# Patient Record
Sex: Male | Born: 1995 | Race: Black or African American | Hispanic: No | Marital: Married | Smoking: Current some day smoker
Health system: Southern US, Community
[De-identification: ages and names within clinical notes are randomized; demographics above are authoritative.]

## PROBLEM LIST (undated history)

## (undated) ENCOUNTER — Emergency Department (HOSPITAL_COMMUNITY): Admission: EM | Payer: Self-pay | Source: Home / Self Care

## (undated) DIAGNOSIS — F1910 Other psychoactive substance abuse, uncomplicated: Secondary | ICD-10-CM

## (undated) DIAGNOSIS — I10 Essential (primary) hypertension: Secondary | ICD-10-CM

## (undated) DIAGNOSIS — I509 Heart failure, unspecified: Secondary | ICD-10-CM

---
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
## 2014-02-24 ENCOUNTER — Ambulatory Visit: Payer: Self-pay | Admitting: Pediatrics

## 2019-04-08 ENCOUNTER — Emergency Department (HOSPITAL_COMMUNITY)
Admission: EM | Admit: 2019-04-08 | Discharge: 2019-04-08 | Disposition: A | Discharge status: Home / Self Care | Payer: Medicaid Other | Attending: Emergency Medicine | Admitting: Emergency Medicine

## 2019-04-08 ENCOUNTER — Emergency Department (HOSPITAL_COMMUNITY): Payer: Medicaid Other

## 2019-04-08 DIAGNOSIS — M79671 Pain in right foot: Secondary | ICD-10-CM | POA: Insufficient documentation

## 2019-04-08 DIAGNOSIS — F17210 Nicotine dependence, cigarettes, uncomplicated: Secondary | ICD-10-CM | POA: Insufficient documentation

## 2019-04-08 DIAGNOSIS — Z791 Long term (current) use of non-steroidal anti-inflammatories (NSAID): Secondary | ICD-10-CM | POA: Insufficient documentation

## 2019-04-08 DIAGNOSIS — M25561 Pain in right knee: Secondary | ICD-10-CM

## 2019-04-08 MED ORDER — HYDROCODONE-ACETAMINOPHEN 5-325 MG PO TABS
1.0000 | ORAL_TABLET | Freq: Once | ORAL | Status: AC
  Administered 2019-04-08: 1 via ORAL
  Filled 2019-04-08: qty 1

## 2019-04-08 NOTE — Discharge Instructions (Signed)

## 2019-04-08 NOTE — ED Triage Notes (Signed)
C/o right knee pain and right foot pain  Reports getting into "something" a "tussle" inside a home last night, not clear on whether he twisted or hit an object.  Tried ice and did not work for pain.

## 2019-04-08 NOTE — ED Notes (Signed)
Patient verbalizes understanding of discharge instructions. Opportunity for questioning and answers were provided. Armband removed by staff, pt discharged from ED via wheelchair to home.  

## 2019-04-08 NOTE — ED Provider Notes (Signed)
[facility] EMERGENCY DEPARTMENT Provider Note   CSN: [id number] Arrival date & time: 04/08/19  1008     History   Chief Complaint Chief Complaint  Patient presents with  . Foot Pain  . Knee Pain    HPI JENNINGS CORADO is a 23 y.o. male.     HPI   Patient is a 23 year old male who presents emergency department today complaining of right knee and foot pain.  Patient states he was leaving a room yesterday and thinks that he twisted his knee and foot.  Since then he has had significant pain.  He has been able to bear weight today but it exacerbates his pain.  He denies any pain in the ankle.  He denies any head trauma or LOC.  No past medical history on file.  There are no active problems to display for this patient.   No past surgical history on file.      Home Medications    Prior to Admission medications   Medication Sig Start Date End Date Taking? Authorizing Provider  meloxicam (MOBIC) 15 MG tablet Take 1 tablet (15 mg total) by mouth daily. 03/03/13   Harden Mo, MD  naproxen (NAPROSYN) 375 MG tablet Take 1 tablet (375 mg total) by mouth 3 (three) times daily with meals. 12/28/13   Junius Creamer, NP  oxyCODONE-acetaminophen (PERCOCET) 5-325 MG per tablet 1 to 2 tablets every 6 hours as needed for pain. 03/03/13   Harden Mo, MD    Family History No family history on file.  Social History Social History   Tobacco Use  . Smoking status: Current Every Day Smoker  Substance Use Topics  . Alcohol use: No  . Drug use: No     Allergies   Patient has no known allergies.   Review of Systems Review of Systems  Musculoskeletal:       Right knee pain, right foot pain  Neurological:       No head trauma or loc     Physical Exam Updated Vital Signs BP 136/74 (BP Location: Right Arm)   Pulse 77   Temp 98.3 F (36.8 C) (Oral)   Resp 20   SpO2 98%   Physical Exam Constitutional:      General: He is not in acute distress.  Appearance: He is well-developed.  Eyes:     Conjunctiva/sclera: Conjunctivae normal.  Cardiovascular:     Rate and Rhythm: Normal rate.  Pulmonary:     Effort: Pulmonary effort is normal.  Musculoskeletal:     Comments: TTP over the medial and lateral joint lines of the right knee.  No obvious deformity or significant swelling.  No obvious joint laxity with varus/valgus stress however patient does not allow for full assessment of the knee 2/2 pain.  TTP to the lateral aspect of the right foot with associated swelling.  No TTP to the medial or lateral malleolus.  Neurovascular intact distally.  Skin:    General: Skin is warm and dry.  Neurological:     Mental Status: He is alert and oriented to person, place, and time.      ED Treatments / Results  Labs (all labs ordered are listed, but only abnormal results are displayed) Labs Reviewed - No data to display  EKG None  Radiology Dg Knee Complete 4 Views Right  Result Date: 04/08/2019 CLINICAL DATA:  23 year old male with history of right knee pain. EXAM: RIGHT KNEE - COMPLETE 4+ VIEW COMPARISON:  No priors. FINDINGS: No evidence of fracture, dislocation, or joint effusion. No evidence of arthropathy or other focal bone abnormality. Soft tissues are unremarkable. IMPRESSION: Negative. Electronically Signed   By: Trudie Reed M.D.   On: 04/08/2019 10:41   Dg Foot Complete Right  Result Date: 04/08/2019 CLINICAL DATA:  Foot pain following twisting injury, initial encounter EXAM: RIGHT FOOT COMPLETE - 3+ VIEW COMPARISON:  None. FINDINGS: There is no evidence of fracture or dislocation. There is no evidence of arthropathy or other focal bone abnormality. Soft tissues are unremarkable. IMPRESSION: No acute abnormality noted. Electronically Signed   By: Alcide Clever M.D.   On: 04/08/2019 10:44    Procedures Procedures (including critical care time)  Medications Ordered in ED Medications  HYDROcodone-acetaminophen (NORCO/VICODIN)  5-325 MG per tablet 1 tablet (1 tablet Oral Given 04/08/19 1240)     Initial Impression / Assessment and Plan / ED Course  I have reviewed the triage vital signs and the nursing notes.  Pertinent labs & imaging results that were available during my care of the patient were reviewed by me and considered in my medical decision making (see chart for details).      Final Clinical Impressions(s) / ED Diagnoses   Final diagnoses:  Acute pain of right knee  Right foot pain   23 year old male presenting for evaluation of right knee and foot pain after injury last night.  Examination of the knee somewhat limited secondary to patient's inability to cooperate with exam though there is no appreciable joint laxity or deformity.  Also does have some foot tenderness.  No tenderness throughout the ankle.  X-ray right knee and right foot negative for acute fracture or abnormality.  Patient given knee immobilizer and crutches.  Advised not to bear weight until he can follow-up with orthopedics.  Advised Tylenol/Motrin, elevation and cold compresses.  Advised on return precautions.  He voices understanding of plan and reasons to return.  Questions answered.  Patient sent for discharge.  ED Discharge Orders    None       Karrie Meres, [place] 04/08/19 1241    Tegeler, Canary Brim, MD 04/08/19 [phone]

## 2020-09-14 ENCOUNTER — Other Ambulatory Visit: Payer: Self-pay

## 2020-09-14 ENCOUNTER — Ambulatory Visit (HOSPITAL_COMMUNITY)
Admission: EM | Admit: 2020-09-14 | Discharge: 2020-09-14 | Disposition: A | Discharge status: Home / Self Care | Payer: Medicaid Other | Attending: Physician Assistant | Admitting: Physician Assistant

## 2020-09-14 ENCOUNTER — Encounter (HOSPITAL_COMMUNITY): Payer: Self-pay | Admitting: Emergency Medicine

## 2020-09-14 DIAGNOSIS — J020 Streptococcal pharyngitis: Secondary | ICD-10-CM

## 2020-09-14 LAB — POCT RAPID STREP A, ED / UC: Streptococcus, Group A Screen (Direct): NEGATIVE

## 2020-09-14 MED ORDER — AMOXICILLIN 500 MG PO CAPS: 500.0000 mg | ORAL_CAPSULE | Freq: Two times a day (BID) | ORAL | 0 refills | Status: AC

## 2020-09-14 MED ORDER — ONDANSETRON HCL 4 MG PO TABS: 4.0000 mg | ORAL_TABLET | Freq: Three times a day (TID) | ORAL | 0 refills | Status: AC | PRN

## 2020-09-14 MED ORDER — ONDANSETRON 4 MG PO TBDP
4.0000 mg | ORAL_TABLET | Freq: Once | ORAL | Status: AC
  Administered 2020-09-14: 4 mg via ORAL

## 2020-09-14 MED ORDER — ONDANSETRON 4 MG PO TBDP
ORAL_TABLET | ORAL | Status: AC
  Filled 2020-09-14: qty 1

## 2020-09-14 NOTE — ED Provider Notes (Signed)
[facility]    CSN: [id number] Arrival date & time: 09/14/20  1841      History   Chief Complaint Chief Complaint  Patient presents with  . Sore Throat    HPI Carlos Mckinney is a 25 y.o. male.   Pt complains of sore throat that started yesterday.  Denies fever, chills, congestion, cough, trouble swallowing.  Pain is worse with swallowing.  He endorses fatigue and associated nausea.  Denies vomiting or GI symptoms. He has tried nothing for his symptoms.  Denies sick contacts.      History reviewed. No pertinent past medical history.  There are no problems to display for this patient.   History reviewed. No pertinent surgical history.     Home Medications    Prior to Admission medications   Medication Sig Start Date End Date Taking? Authorizing Provider  amoxicillin (AMOXIL) 500 MG capsule Take 1 capsule (500 mg total) by mouth 2 (two) times daily for 10 days. 09/14/20 09/24/20 Yes Ashlea Dusing, PA-C  ondansetron (ZOFRAN) 4 MG tablet Take 1 tablet (4 mg total) by mouth every 8 (eight) hours as needed for up to 5 days for nausea or vomiting. 09/14/20 09/19/20 Yes Almas Rake, Shanda Bumps, PA-C  meloxicam (MOBIC) 15 MG tablet Take 1 tablet (15 mg total) by mouth daily. 03/03/13   Reuben Likes, MD  naproxen (NAPROSYN) 375 MG tablet Take 1 tablet (375 mg total) by mouth 3 (three) times daily with meals. 12/28/13   Earley Favor, NP  oxyCODONE-acetaminophen (PERCOCET) 5-325 MG per tablet 1 to 2 tablets every 6 hours as needed for pain. 03/03/13   Reuben Likes, MD    Family History History reviewed. No pertinent family history.  Social History Social History   Tobacco Use  . Smoking status: Current Every Day Smoker  Substance Use Topics  . Alcohol use: No  . Drug use: No     Allergies   Patient has no known allergies.   Review of Systems Review of Systems  Constitutional: Positive for fatigue. Negative for chills and fever.  HENT: Positive for sore  throat. Negative for congestion and ear pain.   Eyes: Negative for pain and visual disturbance.  Respiratory: Negative for cough and shortness of breath.   Cardiovascular: Negative for chest pain and palpitations.  Gastrointestinal: Negative for abdominal pain and vomiting.  Genitourinary: Negative for dysuria and hematuria.  Musculoskeletal: Negative for arthralgias and back pain.  Skin: Negative for color change and rash.  Neurological: Negative for seizures and syncope.  All other systems reviewed and are negative.    Physical Exam Triage Vital Signs ED Triage Vitals  Enc Vitals Group     BP 09/14/20 1913 137/84     Pulse Rate 09/14/20 1913 94     Resp 09/14/20 1913 18     Temp 09/14/20 1913 99.2 F (37.3 C)     Temp Source 09/14/20 1913 Oral     SpO2 09/14/20 1913 100 %     Weight --      Height --      Head Circumference --      Peak Flow --      Pain Score 09/14/20 1912 10     Pain Loc --      Pain Edu? --      Excl. in GC? --    No data found.  Updated Vital Signs BP 137/84 (BP Location: Right Arm)   Pulse 94   Temp 99.2 F (37.3  C) (Oral)   Resp 18   SpO2 100%   Visual Acuity Right Eye Distance:   Left Eye Distance:   Bilateral Distance:    Right Eye Near:   Left Eye Near:    Bilateral Near:     Physical Exam Vitals and nursing note reviewed.  Constitutional:      Appearance: He is well-developed and well-nourished.  HENT:     Head: Normocephalic and atraumatic.     Mouth/Throat:     Pharynx: Pharyngeal swelling present.     Tonsils: Tonsillar exudate present. No tonsillar abscesses. 1+ on the right. 1+ on the left.  Eyes:     Conjunctiva/sclera: Conjunctivae normal.  Cardiovascular:     Rate and Rhythm: Normal rate and regular rhythm.     Heart sounds: No murmur heard.   Pulmonary:     Effort: Pulmonary effort is normal. No respiratory distress.     Breath sounds: Normal breath sounds.  Abdominal:     Palpations: Abdomen is soft.      Tenderness: There is no abdominal tenderness.  Musculoskeletal:        General: No edema.     Cervical back: Neck supple.  Skin:    General: Skin is warm and dry.  Neurological:     Mental Status: He is alert.  Psychiatric:        Mood and Affect: Mood and affect normal.      UC Treatments / Results  Labs (all labs ordered are listed, but only abnormal results are displayed) Labs Reviewed  CULTURE, GROUP A STREP [facility])  POCT RAPID STREP A, ED / UC    EKG   Radiology No results found.  Procedures Procedures (including critical care time)  Medications Ordered in UC Medications  ondansetron (ZOFRAN-ODT) disintegrating tablet 4 mg (has no administration in time range)    Initial Impression / Assessment and Plan / UC Course  I have reviewed the triage vital signs and the nursing notes.  Pertinent labs & imaging results that were available during my care of the patient were reviewed by me and considered in my medical decision making (see chart for details).     Will treat for presumptive strep.  Pt requests something for nausea.  Return precautions discussed.    Final Clinical Impressions(s) / UC Diagnoses   Final diagnoses:  Strep throat     Discharge Instructions     Take medication as prescribed  Can take Tylenol or Ibuprofen as needed for pain.     ED Prescriptions    Medication Sig Dispense Auth. Provider   amoxicillin (AMOXIL) 500 MG capsule Take 1 capsule (500 mg total) by mouth 2 (two) times daily for 10 days. 21 capsule Lonisha Bobby, PA-C   ondansetron (ZOFRAN) 4 MG tablet Take 1 tablet (4 mg total) by mouth every 8 (eight) hours as needed for up to 5 days for nausea or vomiting. 15 tablet Jodell Cipro, PA-C     PDMP not reviewed this encounter.   Jodell Cipro, PA-C 09/14/20 1945

## 2020-09-14 NOTE — Discharge Instructions (Signed)
Take medication as prescribed  Can take Tylenol or Ibuprofen as needed for pain.

## 2020-09-14 NOTE — ED Triage Notes (Signed)
Pt presents with sore throat that started yesterday.

## 2020-09-16 LAB — CULTURE, GROUP A STREP (THRC)

## 2020-09-17 LAB — CULTURE, GROUP A STREP (THRC)

## 2020-12-17 ENCOUNTER — Encounter (HOSPITAL_COMMUNITY): Payer: Self-pay | Admitting: Emergency Medicine

## 2020-12-17 ENCOUNTER — Ambulatory Visit (HOSPITAL_COMMUNITY)
Admission: EM | Admit: 2020-12-17 | Discharge: 2020-12-17 | Disposition: A | Discharge status: Home / Self Care | Payer: Self-pay | Attending: Student | Admitting: Student

## 2020-12-17 ENCOUNTER — Other Ambulatory Visit: Payer: Self-pay

## 2020-12-17 ENCOUNTER — Ambulatory Visit (INDEPENDENT_AMBULATORY_CARE_PROVIDER_SITE_OTHER): Payer: Self-pay

## 2020-12-17 DIAGNOSIS — J189 Pneumonia, unspecified organism: Secondary | ICD-10-CM

## 2020-12-17 DIAGNOSIS — Z20822 Contact with and (suspected) exposure to covid-19: Secondary | ICD-10-CM

## 2020-12-17 DIAGNOSIS — R0602 Shortness of breath: Secondary | ICD-10-CM

## 2020-12-17 DIAGNOSIS — R059 Cough, unspecified: Secondary | ICD-10-CM

## 2020-12-17 DIAGNOSIS — F17200 Nicotine dependence, unspecified, uncomplicated: Secondary | ICD-10-CM

## 2020-12-17 MED ORDER — PREDNISONE 10 MG (21) PO TBPK: ORAL_TABLET | Freq: Every day | ORAL | 0 refills | Status: DC

## 2020-12-17 MED ORDER — ALBUTEROL SULFATE HFA 108 (90 BASE) MCG/ACT IN AERS
2.0000 | INHALATION_SPRAY | Freq: Once | RESPIRATORY_TRACT | Status: AC
  Administered 2020-12-17: 2 via RESPIRATORY_TRACT

## 2020-12-17 MED ORDER — ALBUTEROL SULFATE HFA 108 (90 BASE) MCG/ACT IN AERS
INHALATION_SPRAY | RESPIRATORY_TRACT | Status: AC
  Filled 2020-12-17: qty 6.7

## 2020-12-17 MED ORDER — AMOXICILLIN-POT CLAVULANATE 875-125 MG PO TABS: 1.0000 | ORAL_TABLET | Freq: Two times a day (BID) | ORAL | 0 refills | Status: DC

## 2020-12-17 MED ORDER — DEXAMETHASONE 1 MG/ML PO CONC: 4.0000 mg | Freq: Once | ORAL | Status: DC

## 2020-12-17 MED ORDER — DEXAMETHASONE 10 MG/ML FOR PEDIATRIC ORAL USE
INTRAMUSCULAR | Status: AC
  Filled 2020-12-17: qty 1

## 2020-12-17 NOTE — ED Triage Notes (Addendum)
Pt requesting to use [facility] again. Pt is coughing . Pt observed to be lying flat and reports having a hard time breathing. This was reported to Provider. Pt refuses to sit up because he is tired.

## 2020-12-17 NOTE — Discharge Instructions (Signed)
Left without being discharged

## 2020-12-17 NOTE — ED Triage Notes (Signed)
Pt presents with cough, sob, scratchy throat xs 3 days.

## 2020-12-17 NOTE — ED Provider Notes (Addendum)
[facility]    CSN: [id number] Arrival date & time: 12/17/20  1449      History   Chief Complaint Chief Complaint  Patient presents with  . Shortness of Breath  . Cough    HPI Carlos Mckinney is a 25 y.o. male presenting with cough, sore throat, shortness of breath x3 days. Medical history strep throat, current every day smoker.   Denies formal history of asthma but states that he does have intermittent episodes of shortness of breath. Today presenting with shortness of breath and productive cough with yellow sputum. His children have asthma, and he tried their albuterol inhaler.  Minimal relief today with albuterol inhaler.  Shortness of breath with coughing episodes.  Denies fevers/chills, chest pain, dizziness, weakness in arms or legs, heart palpitations, calf swelling  HPI  History reviewed. No pertinent past medical history.  There are no problems to display for this patient.   History reviewed. No pertinent surgical history.     Home Medications    Prior to Admission medications   Medication Sig Start Date End Date Taking? Authorizing Provider  amoxicillin-clavulanate (AUGMENTIN) 875-125 MG tablet Take 1 tablet by mouth every 12 (twelve) hours. 12/17/20  Yes Rhys Martini, PA-C  predniSONE (STERAPRED UNI-PAK 21 TAB) 10 MG (21) TBPK tablet Take by mouth daily. Take 6 tabs by mouth daily  for 2 days, then 5 tabs for 2 days, then 4 tabs for 2 days, then 3 tabs for 2 days, 2 tabs for 2 days, then 1 tab by mouth daily for 2 days 12/17/20  Yes Cheree Ditto, Lyman Speller, PA-C  meloxicam (MOBIC) 15 MG tablet Take 1 tablet (15 mg total) by mouth daily. 03/03/13   Reuben Likes, MD  naproxen (NAPROSYN) 375 MG tablet Take 1 tablet (375 mg total) by mouth 3 (three) times daily with meals. 12/28/13   Earley Favor, NP  oxyCODONE-acetaminophen (PERCOCET) 5-325 MG per tablet 1 to 2 tablets every 6 hours as needed for pain. 03/03/13   Reuben Likes, MD    Family History History  reviewed. No pertinent family history.  Social History Social History   Tobacco Use  . Smoking status: Current Every Day Smoker  Substance Use Topics  . Alcohol use: No  . Drug use: No     Allergies   Patient has no known allergies.   Review of Systems Review of Systems  Constitutional: Negative for appetite change, chills and fever.  HENT: Positive for congestion and sore throat. Negative for ear pain, rhinorrhea, sinus pressure, sinus pain, trouble swallowing and voice change.   Eyes: Negative for redness and visual disturbance.  Respiratory: Positive for cough and shortness of breath. Negative for chest tightness and wheezing.   Cardiovascular: Negative for chest pain and palpitations.  Gastrointestinal: Negative for abdominal pain, constipation, diarrhea, nausea and vomiting.  Genitourinary: Negative for dysuria, frequency and urgency.  Musculoskeletal: Negative for myalgias.  Neurological: Negative for dizziness, weakness and headaches.  Psychiatric/Behavioral: Negative for confusion.  All other systems reviewed and are negative.    Physical Exam Triage Vital Signs ED Triage Vitals  Enc Vitals Group     BP 12/17/20 1458 106/82     Pulse Rate 12/17/20 1458 (!) 116     Resp 12/17/20 1458 (!) 22     Temp 12/17/20 1458 99 F (37.2 C)     Temp Source 12/17/20 1458 Oral     SpO2 12/17/20 1458 98 %     Weight --  Height --      Head Circumference --      Peak Flow --      Pain Score 12/17/20 1459 0     Pain Loc --      Pain Edu? --      Excl. in GC? --    No data found.  Updated Vital Signs BP 106/82 (BP Location: Right Arm)   Pulse (!) 116   Temp 99 F (37.2 C) (Oral)   Resp (!) 22   SpO2 98%   Visual Acuity Right Eye Distance:   Left Eye Distance:   Bilateral Distance:    Right Eye Near:   Left Eye Near:    Bilateral Near:     Physical Exam Vitals reviewed.  Constitutional:      General: He is not in acute distress.    Appearance: Normal  appearance. He is not ill-appearing.  HENT:     Head: Normocephalic and atraumatic.     Right Ear: Hearing, tympanic membrane, ear canal and external ear normal. No swelling or tenderness. There is no impacted cerumen. No mastoid tenderness. Tympanic membrane is not perforated, erythematous, retracted or bulging.     Left Ear: Hearing, tympanic membrane, ear canal and external ear normal. No swelling or tenderness. There is no impacted cerumen. No mastoid tenderness. Tympanic membrane is not perforated, erythematous, retracted or bulging.     Nose:     Right Sinus: No maxillary sinus tenderness or frontal sinus tenderness.     Left Sinus: No maxillary sinus tenderness or frontal sinus tenderness.     Mouth/Throat:     Mouth: Mucous membranes are moist.     Pharynx: Uvula midline. No oropharyngeal exudate or posterior oropharyngeal erythema.     Tonsils: No tonsillar exudate.  Cardiovascular:     Rate and Rhythm: Normal rate and regular rhythm.     Heart sounds: Normal heart sounds.  Pulmonary:     Effort: Tachypnea present. No accessory muscle usage, prolonged expiration, respiratory distress or retractions.     Breath sounds: Normal air entry. No stridor. Decreased breath sounds present. No wheezing, rhonchi or rales.     Comments: Decreased lung sounds throughout  Frequent hacking cough  No wheezes rhonchi or rales   Chest:     Chest wall: No tenderness.  Abdominal:     General: Abdomen is flat. Bowel sounds are normal.     Tenderness: There is no abdominal tenderness. There is no guarding or rebound.  Lymphadenopathy:     Cervical: No cervical adenopathy.  Skin:    Capillary Refill: Capillary refill takes less than 2 seconds.  Neurological:     General: No focal deficit present.     Mental Status: He is alert and oriented to person, place, and time.  Psychiatric:        Attention and Perception: Attention and perception normal.        Mood and Affect: Mood and affect normal.         Behavior: Behavior normal. Behavior is cooperative.        Thought Content: Thought content normal.        Judgment: Judgment normal.      UC Treatments / Results  Labs (all labs ordered are listed, but only abnormal results are displayed) Labs Reviewed - No data to display  EKG   Radiology DG Chest 2 View  Result Date: 12/17/2020 CLINICAL DATA:  Cough and shortness of breath. EXAM: CHEST - 2 VIEW  COMPARISON:  May 24, 2004 FINDINGS: Mild to moderate severity bilateral, predominantly perihilar and bibasilar infiltrates are seen. There is no evidence of a pleural effusion or pneumothorax. The cardiac silhouette is borderline in size. The visualized skeletal structures are unremarkable. IMPRESSION: Mild to moderate severity bilateral infiltrates, as described above. Electronically Signed   By: Aram Candela M.D.   On: 12/17/2020 16:20    Procedures Procedures (including critical care time)  Medications Ordered in UC Medications  albuterol (VENTOLIN HFA) 108 (90 Base) MCG/ACT inhaler 2 puff (2 puffs Inhalation Given 12/17/20 1537)    Initial Impression / Assessment and Plan / UC Course  I have reviewed the triage vital signs and the nursing notes.  Pertinent labs & imaging results that were available during my care of the patient were reviewed by me and considered in my medical decision making (see chart for details).     This patient is a 25 year old male presenting with pneumonia.  This patient left without being discharged before test results could be delivered in person. He is afebrile, oxygenating well on room air.  Initially, pulse at 116 and respirations at 22.  Albuterol inhaler provided. Following administration of albuterol, minimal improvement in this.   Pt is a current smoker. Given shortness of breath and tachycardia/tachypnea, CXR performed, which showed Mild to moderate severity bilateral infiltrates. Suspect covid-19. Unfortunately, this patient left  before discharge.  Were not able to administer a COVID test or Solu-Medrol.  I did call him to deliver these test results, but he did not answer the phone.  I sent Augmentin and prednisone taper as below, with strict ED return precautions. He is not a diabetic.  Attempted to call this patient twice with no response.   Coding this visit a Level 4 for acute illness with systemic symptoms (pneumonia), prescription drug management (augmentin and prednisone).   Final Clinical Impressions(s) / UC Diagnoses   Final diagnoses:  Pneumonia of both lungs due to infectious organism, unspecified part of lung  Current smoker  Suspected COVID-19 virus infection     Discharge Instructions     Left without being discharged    ED Prescriptions    Medication Sig Dispense Auth. Provider   amoxicillin-clavulanate (AUGMENTIN) 875-125 MG tablet Take 1 tablet by mouth every 12 (twelve) hours. 14 tablet Rhys Martini, PA-C   predniSONE (STERAPRED UNI-PAK 21 TAB) 10 MG (21) TBPK tablet Take by mouth daily. Take 6 tabs by mouth daily  for 2 days, then 5 tabs for 2 days, then 4 tabs for 2 days, then 3 tabs for 2 days, 2 tabs for 2 days, then 1 tab by mouth daily for 2 days 42 tablet Rhys Martini, PA-C     PDMP not reviewed this encounter.   Rhys Martini, PA-C 12/17/20 1643    Rhys Martini, PA-C 12/18/20 1020

## 2020-12-17 NOTE — ED Notes (Signed)
Pt at staff desk reporting he was not going to wait any longer. Pt was encouraged to wait until results of Chest x-ray came back . Pt refused . This Clinical research associate reported to PA Pt left .

## 2020-12-20 ENCOUNTER — Other Ambulatory Visit: Payer: Medicaid Other

## 2021-04-06 ENCOUNTER — Telehealth (HOSPITAL_COMMUNITY): Payer: Self-pay | Admitting: Internal Medicine

## 2021-04-06 NOTE — Telephone Encounter (Signed)
[facility] called to f/u w/referral, please advise

## 2021-05-02 ENCOUNTER — Ambulatory Visit (HOSPITAL_COMMUNITY)
Admission: RE | Admit: 2021-05-02 | Discharge: 2021-05-02 | Disposition: A | Discharge status: Home / Self Care | Payer: Self-pay | Source: Ambulatory Visit | Attending: Internal Medicine | Admitting: Internal Medicine

## 2021-05-02 ENCOUNTER — Other Ambulatory Visit: Payer: Self-pay

## 2021-05-02 ENCOUNTER — Other Ambulatory Visit (HOSPITAL_COMMUNITY): Payer: Self-pay

## 2021-05-02 ENCOUNTER — Encounter (HOSPITAL_COMMUNITY): Payer: Self-pay | Admitting: Internal Medicine

## 2021-05-02 ENCOUNTER — Telehealth (HOSPITAL_COMMUNITY): Payer: Self-pay | Admitting: Pharmacy Technician

## 2021-05-02 VITALS — BP 140/80 | HR 115 | Wt 242.6 lb

## 2021-05-02 DIAGNOSIS — I428 Other cardiomyopathies: Secondary | ICD-10-CM | POA: Insufficient documentation

## 2021-05-02 DIAGNOSIS — I5023 Acute on chronic systolic (congestive) heart failure: Secondary | ICD-10-CM | POA: Insufficient documentation

## 2021-05-02 DIAGNOSIS — Z5900 Homelessness unspecified: Secondary | ICD-10-CM | POA: Insufficient documentation

## 2021-05-02 DIAGNOSIS — I10 Essential (primary) hypertension: Secondary | ICD-10-CM

## 2021-05-02 DIAGNOSIS — R030 Elevated blood-pressure reading, without diagnosis of hypertension: Secondary | ICD-10-CM | POA: Insufficient documentation

## 2021-05-02 DIAGNOSIS — R0683 Snoring: Secondary | ICD-10-CM | POA: Insufficient documentation

## 2021-05-02 DIAGNOSIS — F1410 Cocaine abuse, uncomplicated: Secondary | ICD-10-CM | POA: Insufficient documentation

## 2021-05-02 DIAGNOSIS — I5022 Chronic systolic (congestive) heart failure: Secondary | ICD-10-CM

## 2021-05-02 MED ORDER — FUROSEMIDE 40 MG PO TABS: 40.0000 mg | ORAL_TABLET | Freq: Every day | ORAL | 3 refills | Status: DC

## 2021-05-02 NOTE — Progress Notes (Signed)
ReDS Vest / Clip - 05/02/21 1600       ReDS Vest / Clip   Station Marker D    Ruler Value 32    ReDS Value Range High volume overload    ReDS Actual Value 47

## 2021-05-02 NOTE — Telephone Encounter (Signed)
Advanced Heart Failure Patient Advocate Encounter  Patient was seen in clinic today and started on Entresto.   The patient is currently uninsured. Started an application for [employer] assistance.   Will fax in once signatures are obtained.  

## 2021-05-02 NOTE — Progress Notes (Signed)
ADVANCED HF CLINIC CONSULT NOTE  Referring Physician: Primary Care:Pending Primary Cardiologist:, Quillian Quince  HPI: Carlos Mckinney is a 25 y.o. male with hx tobacco use, cocaine abuse, obesity.  He's had occasional ED visits over the years.  No routine care. No prior hx HF.  ED visit 11/2020 pt endorsed cough, dyspnea, tachycardia/tachypnea.  Chest x-ray with bilateral mild to moderate perihilar and bibasilar infiltrates.  Some suspicion for COVID, pt prescribed CAP coverage and left AMA before covid testing performed.    He was incarcerated from May - September 2022. Recently released from prison 04/25/2021.  Hospitalized at [facility] and transferred to [facility] 34/91 for acute systolic HF and was seen by Advanced Heart Failure team. Diuresed with IV lasix and transitioned to po furosemide 20 mg daily.  Started on GDMT with enstresto and spiro. Echo 08/22 with EF 15-20%, mildly dilated RV with normal RV function, mild MR, moderate TR, PAP 40-45 mmHg. Coronary angiogram not performed. 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 Reports heavy cocaine use for a period of time before incarcerated. Denies any recent drug or alcohol use.  He has been referred to establish care with Advanced Heart Failure close to home in [place]. States he initially began feeling poorly back in April of this year. Reports dyspnea when walking less than a block. No orthopnea, PND or leg edema. Has gained about 16 lb since 09/26. He cut back furosemide from 40 to 20 mg daily d/t frequent urination. Hx of snoring.    SH: Homeless. Stays with family or friends. 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   No cocaine use since May 2022.  FH: His maternal cousin has systolic HF and father has heart issues but details not known.   Review of Systems: [y] = yes, [ ] = no   General: Weight gain [Y]; Weight loss [ ]; Anorexia [ ]; Fatigue [ ]; Fever [ ]; Chills [ ]; Weakness [ ]  Cardiac: Chest pain/pressure [ ];  Resting SOB [ ]; Exertional SOB [Y]; Orthopnea [ ]; Pedal Edema [ ]; Palpitations [ ]; Syncope [ ]; Presyncope [ ]; Paroxysmal nocturnal dyspnea[ ]  Pulmonary: Cough [ ]; Wheezing[ ]; Hemoptysis[ ]; Sputum [ ]; Snoring [Y]  GI: Vomiting[ ]; Dysphagia[ ]; Melena[ ]; Hematochezia [ ]; Heartburn[ ]; Abdominal pain [ ]; Constipation [ ]; Diarrhea [ ]; BRBPR [ ]  GU: Hematuria[ ]; Dysuria [ ]; Nocturia[ ]  Vascular: Pain in legs with walking [ ]; Pain in feet with lying flat [ ]; Non-healing sores [ ]; Stroke [ ]; TIA [ ]; Slurred speech [ ];  Neuro: Headaches[ ]; Vertigo[ ]; Seizures[ ]; Paresthesias[ ];Blurred vision [ ]; Diplopia [ ]; Vision changes [ ]  Ortho/Skin: Arthritis [ ]; Joint pain [ ]; Muscle pain [ ]; Joint swelling [ ]; Back Pain [ ]; Rash [ ]  Psych: Depression[ ]; Anxiety[ ]  Heme: Bleeding problems [ ]; Clotting disorders [ ]; Anemia [ ]  Endocrine: Diabetes [ ]; Thyroid dysfunction[ ]   Past medical history as above.  Current Outpatient Medications  Medication Sig Dispense Refill   furosemide (LASIX) 20 MG tablet Take 20 mg by mouth daily.     magnesium oxide (MAG-OX) 400 MG tablet Take 400 mg by mouth daily.     oxyCODONE-acetaminophen (PERCOCET) 5-325 MG per tablet 1 to 2 tablets every 6 hours as needed for pain. 30 tablet 0   Sacubitril-Valsartan (ENTRESTO PO) Take 1 tablet by mouth 2 (two)  times daily.     spironolactone (ALDACTONE) 25 MG tablet Take 25 mg by mouth daily.     No current facility-administered medications for this encounter.    No Known Allergies    Social History   Socioeconomic History   Marital status: Single    Spouse name: Not on file   Number of children: Not on file   Years of education: Not on file   Highest education level: Not on file  Occupational History   Not on file  Tobacco Use   Smoking status: Every Day   Smokeless tobacco: Never  Substance and Sexual Activity   Alcohol use: No   Drug use: No   Sexual activity: Not on file   Other Topics Concern   Not on file  Social History Narrative   Not on file   Social Determinants of Health   Financial Resource Strain: Not on file  Food Insecurity: No Food Insecurity   Worried About Running Out of Food in the Last Year: Never true   Ran Out of Food in the Last Year: Never true  Transportation Needs: No Transportation Needs   Lack of Transportation (Medical): No   Lack of Transportation (Non-Medical): No  Physical Activity: Not on file  Stress: Not on file  Social Connections: Not on file  Intimate Partner Violence: Not on file     History reviewed. No pertinent family history.  Vitals:   05/02/21 1535  BP: 140/80  Pulse: (!) 115  SpO2: 100%  Weight: 110 kg (242 lb 9.6 oz)   ReDS 47% PHYSICAL EXAM: General:  Well appearing AAM. No respiratory difficulty HEENT: normal Neck: supple. JVP ~ 8-10. Carotids 2+ bilat; no bruits.  Cor: PMI nondisplaced. Regular rate & rhythm, tachycardic. No rubs, gallops or murmurs. Lungs: clear Abdomen: soft, nontender, nondistended. No hepatosplenomegaly. No bruits or masses. Good bowel sounds. Extremities: no cyanosis, clubbing, rash, trace edema Neuro: alert & oriented x 3, cranial nerves grossly intact. moves all 4 extremities w/o difficulty. Affect pleasant.  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 Admit for a/c HF 08/22.  - Echo 08/22: EF 15-20%, mildly dilated RV with normal RV function, mild MR, moderate TR, PAP 40-45 mmHg - Etiology not certain. Considered cocaine use, viral myocarditis or LV noncompaction. Father has heart issues and maternal cousin has systolic HF. Ischemic not likely at his age. LVH on ECG and BP elevated, component of HTN? but young age. - NYHA II/early 3a. Appears slightly volume up. ReDS 47% but may be overestimated. Increase lasix back to 40 mg daily - Continue spiro 25 mg  daily - Continue Entresto (takes 1 tablet BID, not sure of dose). Will bring bottle to next appointment. - Cardiac MRI at next visit pending insurance coverage. - Sleep study in future.  2. Hx cocaine abuse: - Reports he has not used since May 2022  3. Elevated BP: - No prior diagnosis of hypertension - Monitor   4. Snoring: - Sleep study once has insurance.  5. SDOH: -Homeless. Staying with friends and family. Working on housing and insurance once employment starts.  -Met with HF social worker today. Meds through HF fund with exception of Entresto (patient assistance program).  -Given information for [facility]   Follow-up: 1 month  Marlyce Huge, [place]  Patient seen and examined with the above-signed Advanced Practice Provider and/or Housestaff. I personally  reviewed laboratory data, imaging studies and relevant notes. I independently examined the patient and formulated the important aspects of the plan. I have edited the note to reflect any of my changes or salient points. I have personally discussed the plan with the patient and/or family.  25 y/o male with h/o obesity, tobacco and cocaine abuse.  Recently diagnosed with systolic HF in 0/53 at [facility]. Now moved back home to [place]. Has been hmeless and living with various people. NYHA II-III. On Entresto (dose unclear) and spiro 25. Has been off lasix for a bit. ReDS 47%  General:  Obese. No resp difficulty HEENT: normal Neck: supple. Hard to seeCarotids 2+ bilat; no bruits. No lymphadenopathy or thryomegaly appreciated. Cor: PMI nondisplaced. Tachy regular. No rubs, gallops or murmurs. Lungs: clear Abdomen: obese soft, nontender, nondistended. No hepatosplenomegaly. No bruits or masses. Good bowel sounds. Extremities: no cyanosis, clubbing, rash, edema Neuro: alert & orientedx3, cranial nerves grossly intact. moves all 4 extremities w/o difficulty. Affect pleasant  Suspect NICM. NYHA II-III with mild volume overload.  Will increase lasix to 40 daily. Meet with SW and PharmD to get meds and resources. Will get cMRI at next visit. Consider adding digoxin. If not improving will need R/LHC. Stressed need to avoid substance use.   Glori Bickers, MD  7:57 PM

## 2021-05-02 NOTE — Patient Instructions (Signed)
Increase Furosemide to 40 mg Daily  Your physician recommends that you schedule a follow-up appointment in: 1 month  Do the following things EVERYDAY: Weigh yourself in the morning before breakfast. Write it down and keep it in a log. Take your medicines as prescribed Eat low salt foods--Limit salt (sodium) to 2000 mg per day.  Stay as active as you can everyday Limit all fluids for the day to less than 2 liters  If you have any questions or concerns before your next appointment please send [place] a message through [place] or call our office at [phone].    TO LEAVE A MESSAGE FOR THE NURSE SELECT OPTION 2, PLEASE LEAVE A MESSAGE INCLUDING: YOUR NAME DATE OF BIRTH CALL BACK NUMBER REASON FOR CALL**this is important as we prioritize the call backs  YOU WILL RECEIVE A CALL BACK THE SAME DAY AS LONG AS YOU CALL BEFORE 4:00 PM  At the Advanced [facility] Failure Clinic, you and your health needs are our priority. As part of our continuing mission to provide you with exceptional heart care, we have created designated Provider Care Teams. These Care Teams include your primary Cardiologist (physician) and Advanced Practice Providers (APPs- Physician Assistants and Nurse Practitioners) who all work together to provide you with the care you need, when you need it.   You may see any of the following providers on your designated Care Team at your next follow up: Dr Arvilla Meres Dr Marca Ancona Dr Brandon Melnick, NP Robbie Lis, [place] Mikki Santee Karle Plumber, PharmD   Please be sure to bring in all your medications bottles to every appointment.

## 2021-05-03 NOTE — Progress Notes (Signed)
Heart and Vascular Care Navigation  05/03/2021  KIET GEER [birth year] [id number]  Reason for Referral: SDOH concerns   Engaged with patient face to face for initial visit for Heart and Vascular Care Coordination.                                                                                                   Assessment:      Pt reports he was released from prison over the summer and has been working on getting on his feet since then.  States that he is staying with different relatives but is hopeful for getting and apartment soon.  Is hopefully starting a job this week and once he gets stable income he will start looking for his own place.    Has no insurance but his new job will provide insurance for him once he has worked there a few months.  Struggles with transportation as he relies on friends- referral made to [employer].                          HRT/VAS Care Coordination     Living arrangements for the past 2 months No permanent address   Lives with: Relatives   Patient Current Insurance Coverage Self-Pay   Patient Has Concern With Paying Medical Bills Yes   Does Patient Have Prescription Coverage? No   Patient Prescription Assistance Programs Heart Failure Fund; Patient Assistance Programs       Social History:                                                                             SDOH Screenings   Alcohol Screen: Not on file  Depression (PHQ2-9): Not on file  Financial Resource Strain: High Risk   Difficulty of Paying Living Expenses: Hard  Food Insecurity: No Food Insecurity   Worried About Running Out of Food in the Last Year: Never true   Ran Out of Food in the Last Year: Never true  Housing: Medium Risk   Last Housing Risk Score: 1  Physical Activity: Not on file  Social Connections: Not on file  Stress: Not on file  Tobacco Use: High Risk   Smoking Tobacco Use: Every Day   Smokeless Tobacco Use: Never  Transportation Needs: No  Transportation Needs   Lack of Transportation (Medical): No   Lack of Transportation (Non-Medical): No    SDOH Interventions: Financial Resources:  Corporate treasurer Interventions:  [employer] patient assistance and HF [employer])   Food Insecurity:   None reported- has applied for food stamps  Housing Insecurity:   No permanent address- stays with different family members  Transportation:   Transportation Interventions: Retail banker   Follow-up plan:    Pt to reach out if he needs  help with start up costs for housing once he finds a place.  Pt to use [facility] if he needs ride to medical appts- CSW provided with number and instructions.  Pt to sign up for insurance through his work  Burna Sis, LCSW Clinical Social Worker Advanced [facility] Failure Clinic Desk#: [phone] Cell#: [phone]

## 2021-05-06 ENCOUNTER — Other Ambulatory Visit (HOSPITAL_COMMUNITY): Payer: Self-pay

## 2021-05-06 ENCOUNTER — Other Ambulatory Visit (HOSPITAL_COMMUNITY): Payer: Self-pay | Admitting: *Deleted

## 2021-05-06 MED ORDER — SPIRONOLACTONE 25 MG PO TABS
25.0000 mg | ORAL_TABLET | Freq: Every day | ORAL | 11 refills | Status: DC
  Filled 2021-05-06: qty 30, 30d supply, fill #0

## 2021-05-06 MED ORDER — ENTRESTO 24-26 MG PO TABS: 1.0000 | ORAL_TABLET | Freq: Two times a day (BID) | ORAL | 3 refills | Status: DC

## 2021-05-06 MED ORDER — MAGNESIUM OXIDE 400 MG PO TABS: 400.0000 mg | ORAL_TABLET | Freq: Every day | ORAL | 11 refills | Status: DC

## 2021-05-06 NOTE — Telephone Encounter (Signed)
Advanced Heart Failure Patient Advocate Encounter  Called and spoke with patient. Confirmed he is taking 24-26mg  of Entresto BID. The patient request a refill of Magnesium as well to be sent to [facility]. Asked Jasmine (CMA) to send in a 30 day supply. Advised him that a goodrx card may help drop the copay lower. Also sent spironolactone to [facility] outpatient to use on the hf fund.  Will fax in [employer] application and follow up.

## 2021-05-17 ENCOUNTER — Other Ambulatory Visit (HOSPITAL_COMMUNITY): Payer: Self-pay

## 2021-05-24 ENCOUNTER — Emergency Department (HOSPITAL_COMMUNITY): Payer: Self-pay

## 2021-05-24 ENCOUNTER — Emergency Department (HOSPITAL_COMMUNITY)
Admission: EM | Admit: 2021-05-24 | Discharge: 2021-05-24 | Disposition: A | Discharge status: Home / Self Care | Payer: Self-pay | Attending: Emergency Medicine | Admitting: Emergency Medicine

## 2021-05-24 ENCOUNTER — Encounter (HOSPITAL_COMMUNITY): Payer: Self-pay | Admitting: Emergency Medicine

## 2021-05-24 DIAGNOSIS — R0981 Nasal congestion: Secondary | ICD-10-CM | POA: Insufficient documentation

## 2021-05-24 DIAGNOSIS — R0602 Shortness of breath: Secondary | ICD-10-CM | POA: Insufficient documentation

## 2021-05-24 DIAGNOSIS — Z20822 Contact with and (suspected) exposure to covid-19: Secondary | ICD-10-CM | POA: Insufficient documentation

## 2021-05-24 DIAGNOSIS — R059 Cough, unspecified: Secondary | ICD-10-CM | POA: Insufficient documentation

## 2021-05-24 DIAGNOSIS — Z5321 Procedure and treatment not carried out due to patient leaving prior to being seen by health care provider: Secondary | ICD-10-CM | POA: Insufficient documentation

## 2021-05-24 HISTORY — DX: Heart failure, unspecified: I50.9

## 2021-05-24 LAB — CBC WITH DIFFERENTIAL/PLATELET
Abs Immature Granulocytes: 0.03 10*3/uL (ref 0.00–0.07)
Basophils Absolute: 0 10*3/uL (ref 0.0–0.1)
Basophils Relative: 1 %
Eosinophils Absolute: 0.4 10*3/uL (ref 0.0–0.5)
Eosinophils Relative: 4 %
HCT: 35.6 % — ABNORMAL LOW (ref 39.0–52.0)
Hemoglobin: 11.1 g/dL — ABNORMAL LOW (ref 13.0–17.0)
Immature Granulocytes: 0 %
Lymphocytes Relative: 12 %
Lymphs Abs: 1.1 10*3/uL (ref 0.7–4.0)
MCH: 26.6 pg (ref 26.0–34.0)
MCHC: 31.2 g/dL (ref 30.0–36.0)
MCV: 85.4 fL (ref 80.0–100.0)
Monocytes Absolute: 0.7 10*3/uL (ref 0.1–1.0)
Monocytes Relative: 8 %
Neutro Abs: 6.5 10*3/uL (ref 1.7–7.7)
Neutrophils Relative %: 75 %
Platelets: 318 10*3/uL (ref 150–400)
RBC: 4.17 MIL/uL — ABNORMAL LOW (ref 4.22–5.81)
RDW: 17.3 % — ABNORMAL HIGH (ref 11.5–15.5)
WBC: 8.7 10*3/uL (ref 4.0–10.5)
nRBC: 0 % (ref 0.0–0.2)

## 2021-05-24 LAB — BASIC METABOLIC PANEL
Anion gap: 8 (ref 5–15)
BUN: 8 mg/dL (ref 6–20)
CO2: 27 mmol/L (ref 22–32)
Calcium: 9 mg/dL (ref 8.9–10.3)
Chloride: 101 mmol/L (ref 98–111)
Creatinine, Ser: 1.17 mg/dL (ref 0.61–1.24)
GFR, Estimated: >60 mL/min (ref >60)
Glucose, Bld: 114 mg/dL — ABNORMAL HIGH (ref 70–99)
Potassium: 3.6 mmol/L (ref 3.5–5.1)
Sodium: 136 mmol/L (ref 135–145)

## 2021-05-24 LAB — RESP PANEL BY RT-PCR (FLU A&B, COVID) ARPGX2
Influenza A by PCR: NEGATIVE
Influenza B by PCR: NEGATIVE
SARS Coronavirus 2 by RT PCR: NEGATIVE

## 2021-05-24 LAB — TROPONIN I (HIGH SENSITIVITY)
Troponin I (High Sensitivity): 15 ng/L (ref <18)
Troponin I (High Sensitivity): 14 ng/L (ref <18)

## 2021-05-24 LAB — BRAIN NATRIURETIC PEPTIDE: B Natriuretic Peptide: 2125.1 pg/mL — ABNORMAL HIGH (ref 0.0–100.0)

## 2021-05-24 NOTE — Care Management (Signed)
Patient is a new HF patient with the HF Clinic, ED CM will reach out to the HF CM concerning patient .

## 2021-05-24 NOTE — ED Provider Notes (Signed)
Patient LWBS and signed out AMA by nursing. I was reviewing chart when informed of patient's departure.   Arby Barrette, MD 05/24/21 [phone]

## 2021-05-24 NOTE — ED Notes (Signed)
Pt came up to me asking if he is can get a breathing treatment. I informed pt I dont think that will be possible while he is out here in the lobby. I will inform nurse

## 2021-05-24 NOTE — ED Provider Notes (Signed)
Emergency Medicine Provider Triage Evaluation Note  Carlos Mckinney , a 25 y.o. male  was evaluated in triage.  Pt with history of systolic heart failure who has not been taking his Lasix for the last week "because of been so busy".  Presenting today with cough and worsening shortness of breath.  Review of Systems  Positive: Shortness of breath, nonproductive cough, increased lower extremity swelling Negative: Chest pain, palpitations  Physical Exam  BP 137/72 (BP Location: Right Arm)   Pulse (!) 105   Temp 98.7 F (37.1 C) (Oral)   Resp 20   SpO2 100%  Gen:   Awake, no distress   Resp:  Normal effort  MSK:   Moves extremities without difficulty  Other:  Rales in the lung bases bilaterally.  RRR no M/R/G.  Patient coughing throughout exam.  Medical Decision Making  Medically screening exam initiated at 12:14 PM.  Appropriate orders placed.  KEVONTAY BURKS was informed that the remainder of the evaluation will be completed by another provider, this initial triage assessment does not replace that evaluation, and the importance of remaining in the ED until their evaluation is complete.  Patient states that his children are currently living in a hotel and that he has to go to work tonight in order to pay for them to stay there for the next week.  He states he was unable to be admitted but does want to be evaluated today.  States he can come back tomorrow after his shift to be admitted if necessary.  This chart was dictated using voice recognition software, Dragon. Despite the best efforts of this provider to proofread and correct errors, errors may still occur which can change documentation meaning.    Paris Lore, PA-C 05/24/21 1236    Virgina Norfolk, DO 05/24/21 1327

## 2021-05-24 NOTE — ED Notes (Signed)
Pt wanting to leave. Pt adamant about leaving and unwilling to stay.

## 2021-05-24 NOTE — ED Notes (Signed)
Pt not anting to change into gown or be hooked up to monitor.

## 2021-05-24 NOTE — ED Notes (Signed)
Pt tolerated covid swab very poorly

## 2021-05-24 NOTE — ED Triage Notes (Signed)
Pt endorses SOB for a week and congestion. Pt states he hasn't been taking his lasix as scheduled cause he is busy. Coughing in triage.

## 2021-05-24 NOTE — ED Notes (Signed)
No answer for triage.

## 2021-05-25 NOTE — Telephone Encounter (Signed)
Advanced Heart Failure Patient Advocate Encounter   Patient was approved to receive Entresto from [employer]  Patient ID: [id number] Effective dates: 05/17/21 through 05/17/22

## 2021-06-06 NOTE — Progress Notes (Signed)
ADVANCED HF CLINIC NOTE  Primary Care: Pending Primary Cardiologist: Dr. Gala Romney  HPI: Carlos Mckinney is a 25 y.o.male with hx tobacco use, cocaine abuse, obesity.  He's had occasional ED visits over the years.  No routine care.   ED visit 11/2020 with cough, dyspnea, tachycardia/tachypnea.  Chest x-ray with bilateral mild to moderate perihilar and bibasilar infiltrates.  Some suspicion for COVID, pt prescribed CAP coverage and left AMA before covid testing performed.    He was incarcerated from May - September 2022. Recently released from prison 04/25/2021.  Hospitalized at [facility] and transferred to [facility] 08/22 for acute systolic HF and was seen by Advanced Heart Failure team. Diuresed with IV lasix and transitioned to po furosemide 20 mg daily.  Started on GDMT with enstresto and spiro. Echo 08/22 with EF 15-20%, mildly dilated RV with normal RV function, mild MR, moderate TR, PAP 40-45 mmHg. Coronary angiogram not performed. 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 Reports heavy cocaine use for a period of time before incarcerated. Denies any recent drug or alcohol use.  Established care 05/02/21, mild volume overload and NYHA II-III. Lasix increased.  Seen in ED 05/24/21 for CP and swelling, left without being seen.  Today he returns for HF follow up. He has been out of lasix for 1 week and taking Entresto once daily. He has tried to work but gets too tired. He is SOB walking on flat ground. Fatigued during the day. Denies CP, dizziness, edema, or PND/Orthopnea. Appetite ok. No fever or chills. He does not weigh at home. He says he has health insurance starting in January. He is currently staying with his grandmother. Smoking 2 cigarettes/ day. Has 4 children.  SH: Homeless. Staying with grandmother presently. Unable to work, applying for disability.  No cocaine use since May 2022.  Past medical history as above.  Current Outpatient Medications  Medication Sig Dispense Refill    sacubitril-valsartan (ENTRESTO) 24-26 MG Take 1 tablet by mouth 2 (two) times daily. 60 tablet 3   spironolactone (ALDACTONE) 25 MG tablet Take 1 tablet (25 mg total) by mouth daily. 30 tablet 11   furosemide (LASIX) 40 MG tablet Take 1 tablet (40 mg total) by mouth daily. (Patient not taking: Reported on 06/07/2021) 30 tablet 3   magnesium oxide (MAG-OX) 400 MG tablet Take 1 tablet (400 mg total) by mouth daily. (Patient not taking: Reported on 06/07/2021) 30 tablet 11   oxyCODONE-acetaminophen (PERCOCET) 5-325 MG per tablet 1 to 2 tablets every 6 hours as needed for pain. (Patient not taking: Reported on 06/07/2021) 30 tablet 0   No current facility-administered medications for this encounter.   No Known Allergies  Social History   Socioeconomic History   Marital status: Single    Spouse name: Not on file   Number of children: Not on file   Years of education: Not on file   Highest education level: Not on file  Occupational History   Not on file  Tobacco Use   Smoking status: Every Day   Smokeless tobacco: Never  Substance and Sexual Activity   Alcohol use: No   Drug use: No   Sexual activity: Not on file  Other Topics Concern   Not on file  Social History Narrative   Not on file   Social Determinants of Health   Financial Resource Strain: High Risk   Difficulty of Paying Living Expenses: Hard  Food Insecurity: No Food Insecurity   Worried About Running Out of Food in the Last  Year: Never true   Ran Out of Food in the Last Year: Never true  Transportation Needs: No Transportation Needs   Lack of Transportation (Medical): No   Lack of Transportation (Non-Medical): No  Physical Activity: Not on file  Stress: Not on file  Social Connections: Not on file  Intimate Partner Violence: Not on file   FH: His maternal cousin has systolic HF and father has heart issues but details not known.   BP 102/62   Pulse (!) 101   Wt 104.8 kg (231 lb)   SpO2 100%   Wt Readings from  Last 3 Encounters:  06/07/21 104.8 kg (231 lb)  05/02/21 110 kg (242 lb 9.6 oz)  12/28/13 96 kg (211 lb 9.6 oz) (96 %, Z= 1.72)*   * Growth percentiles are based on CDC (Boys, 2-20 Years) data.   PHYSICAL EXAM: General:  NAD. No resp difficulty HEENT: Normal Neck: Supple. JVP ~8. Carotids 2+ bilat; no bruits. No lymphadenopathy or thryomegaly appreciated. Cor: PMI nondisplaced. Tachy rate & rhythm. No rubs, gallops or murmurs. Lungs: Clear Abdomen: Soft, nontender, nondistended. No hepatosplenomegaly. No bruits or masses. Good bowel sounds. Extremities: No cyanosis, clubbing, rash, edema Neuro: Alert & oriented x 3, cranial nerves grossly intact. Moves all 4 extremities w/o difficulty. Affect pleasant.  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 Admit for a/c HF 08/22.  - Echo (8/22): EF 15-20%, mildly dilated RV with normal RV function, mild MR, moderate TR, PAP 40-45 mmHg - Etiology not certain. ? cocaine use, viral myocarditis or LV noncompaction. Father has heart issues and maternal cousin has systolic HF. Ischemic not likely at his age. LVH on ECG and BP elevated, component of HTN?  - Worse NYHA III. Appears slightly volume up. ReDS 46% but may be overestimated.  - Start digoxin 0.125 mg daily. - Re-start Lasix 40 mg daily. Refilled sent. - Continue spiro 25 mg daily. - Continue Entresto 24/26 mg bid. Stressed taking twice a day.  - Needs cMRI & sleep study, but no insurance. - Discussed daily weights. - If not improving will need R/LHC.  - BMET and BNP today; BMET & dig level 1 week.  2. Hx cocaine abuse: - Reports he has not used since May 2022. - Encouraged complete abstinence.  3. Elevated BP: - Improved today. - Monitor.   4. Snoring: - Sleep study once has insurance.  5. Tobacco Use: - Counseled on quitting.  6. SDOH: - Homeless. Staying with grandmother now. Unable to work due  to symptoms.  - Meds through HF fund, Entresto through patient assistance. He has GoodRx card. - Given # for [employer]. - Given a scale today. - He has food stamps.  7. Medication Compliance: - He has lost some of his meds and ran out of lasix. Discussed importance of taking medications as prescribed. - If he is going to be with his grandmother for a longer period of time, consider paramedicine referral.  Follow up in 3 weeks with APP for medication (consider SGLT2i).  Prince Rome, FNP-BC 06/07/21

## 2021-06-07 ENCOUNTER — Encounter (HOSPITAL_COMMUNITY): Payer: Self-pay

## 2021-06-07 ENCOUNTER — Ambulatory Visit (HOSPITAL_COMMUNITY)
Admission: RE | Admit: 2021-06-07 | Discharge: 2021-06-07 | Disposition: A | Discharge status: Home / Self Care | Payer: Self-pay | Source: Ambulatory Visit | Attending: Family Medicine | Admitting: Family Medicine

## 2021-06-07 ENCOUNTER — Other Ambulatory Visit: Payer: Self-pay

## 2021-06-07 ENCOUNTER — Other Ambulatory Visit (HOSPITAL_COMMUNITY): Payer: Self-pay

## 2021-06-07 VITALS — BP 102/62 | HR 101 | Wt 231.0 lb

## 2021-06-07 DIAGNOSIS — Z5900 Homelessness unspecified: Secondary | ICD-10-CM | POA: Insufficient documentation

## 2021-06-07 DIAGNOSIS — F1410 Cocaine abuse, uncomplicated: Secondary | ICD-10-CM | POA: Insufficient documentation

## 2021-06-07 DIAGNOSIS — R0683 Snoring: Secondary | ICD-10-CM | POA: Insufficient documentation

## 2021-06-07 DIAGNOSIS — Z9114 Patient's other noncompliance with medication regimen: Secondary | ICD-10-CM

## 2021-06-07 DIAGNOSIS — I5022 Chronic systolic (congestive) heart failure: Secondary | ICD-10-CM

## 2021-06-07 DIAGNOSIS — I428 Other cardiomyopathies: Secondary | ICD-10-CM | POA: Insufficient documentation

## 2021-06-07 DIAGNOSIS — Z720 Tobacco use: Secondary | ICD-10-CM

## 2021-06-07 DIAGNOSIS — I5023 Acute on chronic systolic (congestive) heart failure: Secondary | ICD-10-CM | POA: Insufficient documentation

## 2021-06-07 DIAGNOSIS — R030 Elevated blood-pressure reading, without diagnosis of hypertension: Secondary | ICD-10-CM | POA: Insufficient documentation

## 2021-06-07 DIAGNOSIS — Z139 Encounter for screening, unspecified: Secondary | ICD-10-CM

## 2021-06-07 DIAGNOSIS — F17210 Nicotine dependence, cigarettes, uncomplicated: Secondary | ICD-10-CM | POA: Insufficient documentation

## 2021-06-07 LAB — BASIC METABOLIC PANEL
Anion gap: 7 (ref 5–15)
BUN: 7 mg/dL (ref 6–20)
CO2: 24 mmol/L (ref 22–32)
Calcium: 8.9 mg/dL (ref 8.9–10.3)
Chloride: 104 mmol/L (ref 98–111)
Creatinine, Ser: 1.3 mg/dL — ABNORMAL HIGH (ref 0.61–1.24)
GFR, Estimated: >60 mL/min (ref >60)
Glucose, Bld: 98 mg/dL (ref 70–99)
Potassium: 4 mmol/L (ref 3.5–5.1)
Sodium: 135 mmol/L (ref 135–145)

## 2021-06-07 LAB — BRAIN NATRIURETIC PEPTIDE: B Natriuretic Peptide: 2805.7 pg/mL — ABNORMAL HIGH (ref 0.0–100.0)

## 2021-06-07 MED ORDER — DIGOXIN 125 MCG PO TABS
0.1250 mg | ORAL_TABLET | Freq: Every day | ORAL | 4 refills | Status: DC
  Filled 2021-06-07: qty 30, 30d supply, fill #0
  Filled 2021-08-25: qty 30, 30d supply, fill #1

## 2021-06-07 MED ORDER — FUROSEMIDE 40 MG PO TABS
40.0000 mg | ORAL_TABLET | Freq: Every day | ORAL | 3 refills | Status: DC
  Filled 2021-06-07: qty 30, 30d supply, fill #0
  Filled 2021-07-18: qty 30, 30d supply, fill #1
  Filled 2021-08-25: qty 30, 30d supply, fill #2

## 2021-06-07 MED ORDER — FUROSEMIDE 40 MG PO TABS: 40.0000 mg | ORAL_TABLET | Freq: Every day | ORAL | 3 refills | Status: DC

## 2021-06-07 MED ORDER — DIGOXIN 125 MCG PO TABS: 0.1250 mg | ORAL_TABLET | Freq: Every day | ORAL | 4 refills | Status: DC

## 2021-06-07 NOTE — Patient Instructions (Signed)
Labs were done, if any labs are abnormal the clinic will call you  START Digoxin 0.125 mg 1 tablet daily   RESTART Lasix 40 mg 2 tablets daily   Your physician recommends that you return for lab work in: 10 days   Your physician recommends that you schedule a follow-up appointment in: 3-4 weeks  WEIGH DAILY.Marland Kitchen a scale has been provided to you  [facility] [phone]  At the [facility], you and your health needs are our priority. As part of our continuing mission to provide you with exceptional heart care, we have created designated Provider Care Teams. These Care Teams include your primary Cardiologist (physician) and Advanced Practice Providers (APPs- Physician Assistants and Nurse Practitioners) who all work together to provide you with the care you need, when you need it.   You may see any of the following providers on your designated Care Team at your next follow up: Dr Arvilla Meres Dr Carron Curie, NP Robbie Lis, [place] [facility], [place] Karle Plumber, PharmD   Please be sure to bring in all your medications bottles to every appointment.    If you have any questions or concerns before your next appointment please send [place] a message through [place] or call our office at [phone].    TO LEAVE A MESSAGE FOR THE NURSE SELECT OPTION 2, PLEASE LEAVE A MESSAGE INCLUDING: YOUR NAME DATE OF BIRTH CALL BACK NUMBER REASON FOR CALL**this is important as we prioritize the call backs  YOU WILL RECEIVE A CALL BACK THE SAME DAY AS LONG AS YOU CALL BEFORE 4:00 PM

## 2021-06-07 NOTE — Progress Notes (Signed)
ReDS Vest / Clip - 06/07/21 1100       ReDS Vest / Clip   Station Marker D    Ruler Value 35.5    ReDS Value Range High volume overload    ReDS Actual Value 46

## 2021-06-07 NOTE — Addendum Note (Signed)
Encounter addended by: Demetrius Charity, RN on: 06/07/2021 11:42 AM  Actions taken: Pharmacy for encounter modified, Order list changed

## 2021-06-16 ENCOUNTER — Other Ambulatory Visit (HOSPITAL_COMMUNITY): Payer: Self-pay

## 2021-07-05 NOTE — Progress Notes (Incomplete)
ADVANCED HF CLINIC NOTE  Primary Care: Pending Primary Cardiologist: Dr. Gala Romney  HPI: Carlos Mckinney is a 25 y.o.male with hx tobacco use, cocaine abuse, obesity.  He's had occasional ED visits over the years.  No routine care.   ED visit 11/2020 with cough, dyspnea, tachycardia/tachypnea.  Chest x-ray with bilateral mild to moderate perihilar and bibasilar infiltrates.  Some suspicion for COVID, pt prescribed CAP coverage and left AMA before covid testing performed.    He was incarcerated from May - September 2022. Released from prison 04/25/2021.  Hospitalized at [facility] and transferred to [facility] 08/22 for acute systolic HF and was seen by Advanced Heart Failure team. Diuresed with IV lasix and transitioned to po furosemide 20 mg daily.  Started on GDMT with Etresto and spiro. Echo 08/22 with EF 15-20%, mildly dilated RV with normal RV function, mild MR, moderate TR, PAP 40-45 mmHg. Coronary angiogram not performed. 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 Reports heavy cocaine use for a period of time before incarcerated. Denies any recent drug or alcohol use.  Established care 05/02/21, mild volume overload and NYHA II-III. Lasix increased.  Seen in ED 05/24/21 for CP and swelling, left without being seen.  Today he returns for HF follow up. He has been out of lasix for 1 week and taking Entresto once daily. He has tried to work but gets too tired. He is SOB walking on flat ground. Fatigued during the day. Denies CP, dizziness, edema, or PND/Orthopnea. Appetite ok. No fever or chills. He does not weigh at home. He says he has health insurance starting in January. He is currently staying with his grandmother. Smoking 2 cigarettes/ day. Has 4 children.   Past medical history as above.  Current Outpatient Medications  Medication Sig Dispense Refill   digoxin (LANOXIN) 0.125 MG tablet Take 1 tablet (0.125 mg total) by mouth daily. 30 tablet 4   furosemide (LASIX) 40 MG tablet Take 1 tablet  (40 mg total) by mouth daily. 30 tablet 3   magnesium oxide (MAG-OX) 400 MG tablet Take 1 tablet (400 mg total) by mouth daily. (Patient not taking: Reported on 06/07/2021) 30 tablet 11   oxyCODONE-acetaminophen (PERCOCET) 5-325 MG per tablet 1 to 2 tablets every 6 hours as needed for pain. (Patient not taking: Reported on 06/07/2021) 30 tablet 0   sacubitril-valsartan (ENTRESTO) 24-26 MG Take 1 tablet by mouth 2 (two) times daily. 60 tablet 3   spironolactone (ALDACTONE) 25 MG tablet Take 1 tablet (25 mg total) by mouth daily. 30 tablet 11   No current facility-administered medications for this visit.   No Known Allergies  Social History   Socioeconomic History   Marital status: Single    Spouse name: Not on file   Number of children: Not on file   Years of education: Not on file   Highest education level: Not on file  Occupational History   Not on file  Tobacco Use   Smoking status: Every Day   Smokeless tobacco: Never  Substance and Sexual Activity   Alcohol use: No   Drug use: No   Sexual activity: Not on file  Other Topics Concern   Not on file  Social History Narrative   Not on file   Social Determinants of Health   Financial Resource Strain: High Risk   Difficulty of Paying Living Expenses: Hard  Food Insecurity: No Food Insecurity   Worried About Running Out of Food in the Last Year: Never true   Ran Out  of Food in the Last Year: Never true  Transportation Needs: No Transportation Needs   Lack of Transportation (Medical): No   Lack of Transportation (Non-Medical): No  Physical Activity: Not on file  Stress: Not on file  Social Connections: Not on file  Intimate Partner Violence: Not on file   FH: His maternal cousin has systolic HF and father has heart issues but details not known.   SH: Homeless. Staying with grandmother presently. Unable to work, applying for disability. No cocaine use since May 2022.  There were no vitals taken for this visit.  PHYSICAL  EXAM: General:  NAD. No resp difficulty HEENT: Normal Neck: Supple. JVP ~8. Carotids 2+ bilat; no bruits. No lymphadenopathy or thryomegaly appreciated. Cor: PMI nondisplaced. Tachy rate & rhythm. No rubs, gallops or murmurs. Lungs: Clear Abdomen: Soft, nontender, nondistended. No hepatosplenomegaly. No bruits or masses. Good bowel sounds. Extremities: No cyanosis, clubbing, rash, edema Neuro: Alert & oriented x 3, cranial nerves grossly intact. Moves all 4 extremities w/o difficulty. Affect pleasant.  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 Admit for a/c HF 08/22.  - Echo (8/22): EF 15-20%, mildly dilated RV with normal RV function, mild MR, moderate TR, PAP 40-45 mmHg - Etiology not certain. ? cocaine use, viral myocarditis or LV noncompaction. Father has heart issues and maternal cousin has systolic HF. Ischemic not likely at his age. LVH on ECG and BP elevated, component of HTN?  - Worse NYHA III. Appears slightly volume up. ReDS 46% but may be overestimated.  - Start digoxin 0.125 mg daily. - Re-start Lasix 40 mg daily. Refilled sent. - Continue spiro 25 mg daily. - Continue Entresto 24/26 mg bid. Stressed taking twice a day.  - Needs cMRI & sleep study, but no insurance. - Discussed daily weights. - If not improving will need R/LHC.  - BMET and BNP today; BMET & dig level 1 week.  2. Hx cocaine abuse: - Reports he has not used since May 2022. - Encouraged complete abstinence.  3. Elevated BP: - Improved today. - Monitor.   4. Snoring: - Sleep study once has insurance.  5. Tobacco Use: - Counseled on quitting.  6. SDOH: - Homeless. Staying with grandmother now. Unable to work due to symptoms.  - Meds through HF fund, Entresto through patient assistance. He has GoodRx card. - Given # for [employer]. - Given a scale today. - He has food stamps.  7. Medication Compliance: -  He has lost some of his meds and ran out of lasix. Discussed importance of taking medications as prescribed. - If he is going to be with his grandmother for a longer period of time, consider paramedicine referral.  Follow up in 3 weeks with APP for medication (consider SGLT2i).  Prince Rome, FNP-BC 07/05/21

## 2021-07-07 ENCOUNTER — Telehealth (HOSPITAL_COMMUNITY): Payer: Self-pay

## 2021-07-07 NOTE — Telephone Encounter (Signed)
Called and left patient a voice message to confirm/remind patient of their appointment at the [facility] on 07/08/21.

## 2021-07-08 ENCOUNTER — Encounter (HOSPITAL_COMMUNITY): Payer: Self-pay

## 2021-07-08 ENCOUNTER — Ambulatory Visit (HOSPITAL_COMMUNITY)
Admission: EM | Admit: 2021-07-08 | Discharge: 2021-07-08 | Disposition: A | Discharge status: Home / Self Care | Payer: Self-pay | Attending: Internal Medicine | Admitting: Internal Medicine

## 2021-07-08 ENCOUNTER — Other Ambulatory Visit: Payer: Self-pay

## 2021-07-08 DIAGNOSIS — I5023 Acute on chronic systolic (congestive) heart failure: Secondary | ICD-10-CM

## 2021-07-08 DIAGNOSIS — K5900 Constipation, unspecified: Secondary | ICD-10-CM

## 2021-07-08 MED ORDER — POLYETHYLENE GLYCOL 3350 17 G PO PACK: 17.0000 g | PACK | Freq: Every day | ORAL | Status: DC | PRN

## 2021-07-08 MED ORDER — POLYETHYLENE GLYCOL 3350 17 G PO PACK: 17.0000 g | PACK | Freq: Every day | ORAL | 0 refills | Status: DC

## 2021-07-08 NOTE — ED Triage Notes (Signed)
Pt presents with shortness of breath X 1 week.  Pt has CHF

## 2021-07-08 NOTE — Discharge Instructions (Addendum)
Increase fruit and vegetable intake Take medications as prescribed Increase Lasix to 40 mg twice daily.  Take Lasix 9 AM and 5 PM for the next 4 days.  Follow-up with the heart failure clinic as scheduled Daily weights Decrease fluids to less than 2 L a day. Return to CHF clinic sooner if symptoms worsen.

## 2021-07-08 NOTE — ED Provider Notes (Signed)
[facility]    CSN: [id number] Arrival date & time: 07/08/21  1600      History   Chief Complaint Chief Complaint  Patient presents with   Shortness of Breath    HPI Carlos Mckinney is a 25 y.o. male with a history of systolic heart failure comes to urgent care with worsening shortness of breath, orthopnea and paroxysmal nocturnal dyspnea for week duration.  Patient has associated pedal edema.  He denies any chest pain or chest pressure.  No nausea or vomiting.  No fever or chills.  No cough or sputum production.  Patient takes 40 mg of Lasix daily.  He does not know his dry weight.  Patient also complains of constipation and perirectal pain with bowel movement.  He has a history of hemorrhoids.  No rectal bleeding HPI  Past Medical History:  Diagnosis Date   Heart failure (HCC)     There are no problems to display for this patient.   History reviewed. No pertinent surgical history.     Home Medications    Prior to Admission medications   Medication Sig Start Date End Date Taking? Authorizing Provider  polyethylene glycol (MIRALAX) 17 g packet Take 17 g by mouth daily. 07/08/21  Yes Malijah Lietz, Britta Mccreedy, MD  digoxin (LANOXIN) 0.125 MG tablet Take 1 tablet (0.125 mg total) by mouth daily. 06/07/21   Milford, Anderson Malta, FNP  furosemide (LASIX) 40 MG tablet Take 1 tablet (40 mg total) by mouth daily. 06/07/21   Milford, Anderson Malta, FNP  sacubitril-valsartan (ENTRESTO) 24-26 MG Take 1 tablet by mouth 2 (two) times daily. 05/06/21   Bensimhon, Bevelyn Buckles, MD  spironolactone (ALDACTONE) 25 MG tablet Take 1 tablet (25 mg total) by mouth daily. 05/06/21   Bensimhon, Bevelyn Buckles, MD    Family History Family History  Family history unknown: Yes    Social History Social History   Tobacco Use   Smoking status: Every Day   Smokeless tobacco: Never  Substance Use Topics   Alcohol use: No   Drug use: No     Allergies   Patient has no known allergies.   Review of  Systems Review of Systems As Per HPI  Physical Exam Triage Vital Signs ED Triage Vitals  Enc Vitals Group     BP 07/08/21 1616 115/77     Pulse Rate 07/08/21 1616 (!) 115     Resp 07/08/21 1616 20     Temp 07/08/21 1616 98 F (36.7 C)     Temp Source 07/08/21 1616 Oral     SpO2 07/08/21 1616 100 %     Weight --      Height --      Head Circumference --      Peak Flow --      Pain Score 07/08/21 1615 1     Pain Loc --      Pain Edu? --      Excl. in GC? --    No data found.  Updated Vital Signs BP 115/77 (BP Location: Right Arm)   Pulse (!) 115   Temp 98 F (36.7 C) (Oral)   Resp 20   SpO2 100%   Visual Acuity Right Eye Distance:   Left Eye Distance:   Bilateral Distance:    Right Eye Near:   Left Eye Near:    Bilateral Near:     Physical Exam Vitals and nursing note reviewed.  Constitutional:      General: He is  not in acute distress.    Appearance: He is not ill-appearing.  Cardiovascular:     Rate and Rhythm: Normal rate and regular rhythm.  Pulmonary:     Effort: Pulmonary effort is normal.     Breath sounds: Normal breath sounds. No decreased breath sounds, wheezing, rhonchi or rales.  Abdominal:     Palpations: Abdomen is soft. There is no hepatomegaly or splenomegaly.  Musculoskeletal:        General: Normal range of motion.  Neurological:     Mental Status: He is alert.     UC Treatments / Results  Labs (all labs ordered are listed, but only abnormal results are displayed) Labs Reviewed - No data to display  EKG   Radiology No results found.  Procedures Procedures (including critical care time)  Medications Ordered in UC Medications - No data to display  Initial Impression / Assessment and Plan / UC Course  I have reviewed the triage vital signs and the nursing notes.  Pertinent labs & imaging results that were available during my care of the patient were reviewed by me and considered in my medical decision making (see chart for  details).    1.  Acute on chronic systolic heart failure: Increase Lasix to 40 mg twice daily over the next 4 days and decrease to 40 daily Follow-up with congestive heart failure clinic as scheduled Daily weights Decrease oral fluid intake to less than 1800 mL a day He will need basic metabolic panel at the follow-up Go to ED if shortness of breath, orthopnea or paroxysmal nocturnal dyspnea worsens.  Final Clinical Impressions(s) / UC Diagnoses   Final diagnoses:  Acute on chronic systolic congestive heart failure (HCC)  Constipation, unspecified constipation type     Discharge Instructions      Increase fruit and vegetable intake Take medications as prescribed Increase Lasix to 40 mg twice daily.  Take Lasix 9 AM and 5 PM for the next 4 days.  Follow-up with the heart failure clinic as scheduled Daily weights Decrease fluids to less than 2 L a day. Return to CHF clinic sooner if symptoms worsen.     ED Prescriptions     Medication Sig Dispense Auth. Provider   polyethylene glycol (MIRALAX) 17 g packet Take 17 g by mouth daily. 28 each Quincey Quesinberry, Britta Mccreedy, MD      PDMP not reviewed this encounter.   Merrilee Jansky, MD 07/08/21 [phone]

## 2021-07-13 ENCOUNTER — Telehealth (HOSPITAL_COMMUNITY): Payer: Self-pay

## 2021-07-13 NOTE — Telephone Encounter (Signed)
Called patient and left a detailed message to confirm/remind patient of their appointment at the [facility] on 07/14/21.   I also left patient a message to bring all medication or a current medication list and to give our office a call back if needed and the number was provided.

## 2021-07-14 ENCOUNTER — Encounter (HOSPITAL_COMMUNITY): Payer: Self-pay

## 2021-07-18 ENCOUNTER — Other Ambulatory Visit (HOSPITAL_COMMUNITY): Payer: Self-pay | Admitting: Internal Medicine

## 2021-07-18 ENCOUNTER — Emergency Department (HOSPITAL_COMMUNITY)
Admission: EM | Admit: 2021-07-18 | Discharge: 2021-07-19 | Disposition: A | Discharge status: Home / Self Care | Payer: Self-pay | Attending: Emergency Medicine | Admitting: Emergency Medicine

## 2021-07-18 ENCOUNTER — Encounter (HOSPITAL_COMMUNITY): Payer: Self-pay

## 2021-07-18 ENCOUNTER — Other Ambulatory Visit (HOSPITAL_COMMUNITY): Payer: Self-pay

## 2021-07-18 ENCOUNTER — Emergency Department (HOSPITAL_COMMUNITY): Payer: Self-pay

## 2021-07-18 DIAGNOSIS — F17200 Nicotine dependence, unspecified, uncomplicated: Secondary | ICD-10-CM | POA: Insufficient documentation

## 2021-07-18 DIAGNOSIS — I509 Heart failure, unspecified: Secondary | ICD-10-CM | POA: Insufficient documentation

## 2021-07-18 DIAGNOSIS — R0601 Orthopnea: Secondary | ICD-10-CM | POA: Insufficient documentation

## 2021-07-18 MED ORDER — FUROSEMIDE 10 MG/ML IJ SOLN
40.0000 mg | Freq: Once | INTRAMUSCULAR | Status: AC
  Administered 2021-07-19: 01:00:00 40 mg via INTRAVENOUS
  Filled 2021-07-18: qty 4

## 2021-07-18 MED ORDER — POTASSIUM CHLORIDE CRYS ER 20 MEQ PO TBCR
20.0000 meq | EXTENDED_RELEASE_TABLET | Freq: Once | ORAL | Status: DC
  Filled 2021-07-18: qty 1

## 2021-07-18 NOTE — ED Provider Notes (Signed)
Emergency Medicine Provider Triage Evaluation Note  Carlos Mckinney , a 25 y.o. male  was evaluated in triage.  Pt complains of shortness of breath.  Pt has a history of chf.  Pt ran out of his medications.    Review of Systems  Positive: Chest pain  Negative: Fever   Physical Exam  BP 116/85 (BP Location: Left Arm)    Pulse (!) 58    Temp 98 F (36.7 C) (Oral)    Resp 20    Ht 5\' 11"  (1.803 m)    Wt 101.6 kg    SpO2 100%    BMI 31.24 kg/m  Gen:   Awake, no distress   Resp:  Normal effort  MSK:   Moves extremities without difficulty  Other:    Medical Decision Making  Medically screening exam initiated at 11:45 PM.  Appropriate orders placed.  GORMAN SAFI was informed that the remainder of the evaluation will be completed by another provider, this initial triage assessment does not replace that evaluation, and the importance of remaining in the ED until their evaluation is complete.     Hayden Pedro, PA-C 07/18/21 2346    07/20/21, MD 07/19/21 [phone]

## 2021-07-18 NOTE — ED Triage Notes (Signed)
Patient presents with SOB x 3 days. Patient reports worsening shortness of breath when lying down, takes [place] and states he ran out 3 days ago. Hx of CHF

## 2021-07-19 ENCOUNTER — Other Ambulatory Visit (HOSPITAL_COMMUNITY): Payer: Self-pay

## 2021-07-19 LAB — CBC
HCT: 33 % — ABNORMAL LOW (ref 39.0–52.0)
Hemoglobin: 10.3 g/dL — ABNORMAL LOW (ref 13.0–17.0)
MCH: 25.2 pg — ABNORMAL LOW (ref 26.0–34.0)
MCHC: 31.2 g/dL (ref 30.0–36.0)
MCV: 80.9 fL (ref 80.0–100.0)
Platelets: 348 10*3/uL (ref 150–400)
RBC: 4.08 MIL/uL — ABNORMAL LOW (ref 4.22–5.81)
RDW: 16.1 % — ABNORMAL HIGH (ref 11.5–15.5)
WBC: 6 10*3/uL (ref 4.0–10.5)
nRBC: 0 % (ref 0.0–0.2)

## 2021-07-19 LAB — BASIC METABOLIC PANEL
Anion gap: 9 (ref 5–15)
BUN: 13 mg/dL (ref 6–20)
CO2: 26 mmol/L (ref 22–32)
Calcium: 9.4 mg/dL (ref 8.9–10.3)
Chloride: 101 mmol/L (ref 98–111)
Creatinine, Ser: 1.38 mg/dL — ABNORMAL HIGH (ref 0.61–1.24)
GFR, Estimated: >60 mL/min (ref >60)
Glucose, Bld: 99 mg/dL (ref 70–99)
Potassium: 3.9 mmol/L (ref 3.5–5.1)
Sodium: 136 mmol/L (ref 135–145)

## 2021-07-19 LAB — BRAIN NATRIURETIC PEPTIDE: B Natriuretic Peptide: 2693 pg/mL — ABNORMAL HIGH (ref 0.0–100.0)

## 2021-07-19 LAB — MAGNESIUM: Magnesium: 1.9 mg/dL (ref 1.7–2.4)

## 2021-07-19 LAB — DIGOXIN LEVEL: Digoxin Level: <0.2 ng/mL — ABNORMAL LOW (ref 0.8–2.0)

## 2021-07-19 MED ORDER — POTASSIUM CHLORIDE 20 MEQ PO PACK
20.0000 meq | PACK | Freq: Once | ORAL | Status: AC
Start: 2021-07-19 — End: 2021-07-19
  Administered 2021-07-19: 01:00:00 20 meq via ORAL
  Filled 2021-07-19: qty 1

## 2021-07-19 MED ORDER — ENTRESTO 24-26 MG PO TABS
1.0000 | ORAL_TABLET | Freq: Two times a day (BID) | ORAL | 3 refills | Status: DC
  Filled 2021-07-19: qty 60, 30d supply, fill #0

## 2021-07-19 NOTE — Discharge Instructions (Addendum)
Dr. Gala Romney has already refilled all of your medications to be filled at the [facility]. Fill the prescriptions today and take as directed. Follow up with the CHF clinic as scheduled.   Return to the ED with any new or worsening symptoms at any time.

## 2021-07-19 NOTE — ED Provider Notes (Signed)
[facility]-EMERGENCY DEPT Provider Note   CSN: [id number] Arrival date & time: 07/18/21  2315     History Chief Complaint  Patient presents with   Shortness of Breath    Carlos Mckinney is a 25 y.o. male.  Patient to ED for evaluation of SOB, orthopnea, recent diagnosis of CHF, who ran out of medications 2 days ago. No chest pain. No fever, cough or vomiting.   The history is provided by the patient. No language interpreter was used.  Shortness of Breath Associated symptoms: no chest pain, no cough and no fever       Past Medical History:  Diagnosis Date   Heart failure (HCC)     There are no problems to display for this patient.   History reviewed. No pertinent surgical history.     Family History  Family history unknown: Yes    Social History   Tobacco Use   Smoking status: Every Day   Smokeless tobacco: Never  Substance Use Topics   Alcohol use: No   Drug use: No    Home Medications Prior to Admission medications   Medication Sig Start Date End Date Taking? Authorizing Provider  digoxin (LANOXIN) 0.125 MG tablet Take 1 tablet (0.125 mg total) by mouth daily. 06/07/21   Milford, Anderson Malta, FNP  furosemide (LASIX) 40 MG tablet Take 1 tablet (40 mg total) by mouth daily. 06/07/21   Milford, Anderson Malta, FNP  polyethylene glycol (MIRALAX) 17 g packet Take 17 g by mouth daily. 07/08/21   Lamptey, Britta Mccreedy, MD  sacubitril-valsartan (ENTRESTO) 24-26 MG Take 1 tablet by mouth 2 (two) times daily. 05/06/21   Bensimhon, Bevelyn Buckles, MD  spironolactone (ALDACTONE) 25 MG tablet Take 1 tablet (25 mg total) by mouth daily. 05/06/21   Bensimhon, Bevelyn Buckles, MD    Allergies    Patient has no known allergies.  Review of Systems   Review of Systems  Constitutional:  Negative for chills and fever.  HENT: Negative.    Respiratory:  Positive for shortness of breath. Negative for cough.   Cardiovascular: Negative.  Negative for chest pain.   Gastrointestinal: Negative.   Musculoskeletal: Negative.   Skin: Negative.   Neurological: Negative.    Physical Exam Updated Vital Signs BP (!) 131/96    Pulse 60    Temp 98 F (36.7 C) (Oral)    Resp (!) 21    Ht 5\' 11"  (1.803 m)    Wt 101.6 kg    SpO2 94%    BMI 31.24 kg/m   Physical Exam Vitals and nursing note reviewed.  Constitutional:      General: He is not in acute distress.    Appearance: He is well-developed.  Cardiovascular:     Rate and Rhythm: Normal rate and regular rhythm.     Heart sounds: No murmur heard. Pulmonary:     Effort: Pulmonary effort is normal.     Breath sounds: No wheezing, rhonchi or rales.     Comments: Examined after 40 mg IV Lasix and significant urinary output Abdominal:     General: Bowel sounds are normal.     Palpations: Abdomen is soft.  Musculoskeletal:     Cervical back: Normal range of motion and neck supple.  Neurological:     Mental Status: He is alert.    ED Results / Procedures / Treatments   Labs (all labs ordered are listed, but only abnormal results are displayed) Labs Reviewed  BASIC METABOLIC PANEL -  Abnormal; Notable for the following components:      Result Value   Creatinine, Ser 1.38 (*)    All other components within normal limits  BRAIN NATRIURETIC PEPTIDE - Abnormal; Notable for the following components:   B Natriuretic Peptide 2,693.0 (*)    All other components within normal limits  CBC - Abnormal; Notable for the following components:   RBC 4.08 (*)    Hemoglobin 10.3 (*)    HCT 33.0 (*)    MCH 25.2 (*)    RDW 16.1 (*)    All other components within normal limits  DIGOXIN LEVEL - Abnormal; Notable for the following components:   Digoxin Level <0.2 (*)    All other components within normal limits  MAGNESIUM    EKG None  Radiology DG Chest Portable 1 View  Result Date: 07/19/2021 CLINICAL DATA:  Chest pain and shortness of breath. EXAM: PORTABLE CHEST 1 VIEW COMPARISON:  May 24, 2021  FINDINGS: The cardiac silhouette is markedly enlarged and unchanged in size. Both lungs are clear. The visualized skeletal structures are unremarkable. IMPRESSION: Stable cardiomegaly without acute cardiopulmonary disease. Electronically Signed   By: Aram Candela M.D.   On: 07/19/2021 00:08    Procedures Procedures   Medications Ordered in ED Medications  furosemide (LASIX) injection 40 mg (40 mg Intravenous Given 07/19/21 0042)  potassium chloride (KLOR-CON) packet 20 mEq (20 mEq Oral Given 07/19/21 0051)    ED Course  I have reviewed the triage vital signs and the nursing notes.  Pertinent labs & imaging results that were available during my care of the patient were reviewed by me and considered in my medical decision making (see chart for details).    MDM Rules/Calculators/A&P                         Patient to ED with SoB w/o CP, h/o CHF, out of medications. No fever.   Well appearing. Breathing easily without difficulty or increased effort. Lungs are clear.   Chart reviewed. Per cardiology 04/2021 EF 15-20% on ECHO. Meds list reviewed and include Lasix 40 mg QD (states he picked this up yesterday); Entresto 24-26 mg one BID; Adactone 25 mg 1 QD; digoxin (patient has). He states he take magnesium as well not on this list.   CXR without pulmonary edema. No chest pain. EKG nonacute. BNP 2700, c/w repeated testing. Digoxin level low concerning for noncompliance.   He is feeling better after IV Lasix here. He reports he is tired and wants to go home. He has been seen in the CHF clinic and will follow up there on 12/28, per scheduled appointment.   He can be discharged home. Return precautions discussed.     Final Clinical Impression(s) / ED Diagnoses Final diagnoses:  None   CHF  Rx / DC Orders ED Discharge Orders     None        Elpidio Anis, PA-C 07/19/21 0304    Gwyneth Sprout, MD 07/19/21 [phone]

## 2021-08-06 ENCOUNTER — Inpatient Hospital Stay (HOSPITAL_COMMUNITY)
Admission: EM | Admit: 2021-08-06 | Discharge: 2021-08-08 | DRG: 811 | Disposition: A | Discharge status: Home / Self Care | Payer: Medicaid Other | Attending: Internal Medicine | Admitting: Internal Medicine

## 2021-08-06 ENCOUNTER — Emergency Department (HOSPITAL_COMMUNITY): Payer: Medicaid Other

## 2021-08-06 ENCOUNTER — Encounter (HOSPITAL_COMMUNITY): Payer: Self-pay | Admitting: *Deleted

## 2021-08-06 ENCOUNTER — Other Ambulatory Visit: Payer: Self-pay

## 2021-08-06 DIAGNOSIS — I2699 Other pulmonary embolism without acute cor pulmonale: Secondary | ICD-10-CM | POA: Diagnosis not present

## 2021-08-06 DIAGNOSIS — Z79899 Other long term (current) drug therapy: Secondary | ICD-10-CM

## 2021-08-06 DIAGNOSIS — F1410 Cocaine abuse, uncomplicated: Secondary | ICD-10-CM | POA: Diagnosis present

## 2021-08-06 DIAGNOSIS — D509 Iron deficiency anemia, unspecified: Principal | ICD-10-CM | POA: Diagnosis present

## 2021-08-06 DIAGNOSIS — Z20822 Contact with and (suspected) exposure to covid-19: Secondary | ICD-10-CM | POA: Diagnosis present

## 2021-08-06 DIAGNOSIS — I5023 Acute on chronic systolic (congestive) heart failure: Secondary | ICD-10-CM | POA: Diagnosis present

## 2021-08-06 DIAGNOSIS — M79604 Pain in right leg: Secondary | ICD-10-CM

## 2021-08-06 DIAGNOSIS — I82441 Acute embolism and thrombosis of right tibial vein: Secondary | ICD-10-CM | POA: Diagnosis present

## 2021-08-06 DIAGNOSIS — M7989 Other specified soft tissue disorders: Secondary | ICD-10-CM | POA: Diagnosis not present

## 2021-08-06 DIAGNOSIS — E871 Hypo-osmolality and hyponatremia: Secondary | ICD-10-CM | POA: Diagnosis present

## 2021-08-06 DIAGNOSIS — D649 Anemia, unspecified: Secondary | ICD-10-CM | POA: Diagnosis present

## 2021-08-06 DIAGNOSIS — I82401 Acute embolism and thrombosis of unspecified deep veins of right lower extremity: Secondary | ICD-10-CM | POA: Diagnosis present

## 2021-08-06 DIAGNOSIS — I2694 Multiple subsegmental pulmonary emboli without acute cor pulmonale: Secondary | ICD-10-CM | POA: Diagnosis present

## 2021-08-06 DIAGNOSIS — Z86711 Personal history of pulmonary embolism: Secondary | ICD-10-CM

## 2021-08-06 DIAGNOSIS — F17210 Nicotine dependence, cigarettes, uncomplicated: Secondary | ICD-10-CM | POA: Diagnosis present

## 2021-08-06 LAB — BASIC METABOLIC PANEL
Anion gap: 9 (ref 5–15)
BUN: 15 mg/dL (ref 6–20)
CO2: 24 mmol/L (ref 22–32)
Calcium: 8.9 mg/dL (ref 8.9–10.3)
Chloride: 100 mmol/L (ref 98–111)
Creatinine, Ser: 1.35 mg/dL — ABNORMAL HIGH (ref 0.61–1.24)
GFR, Estimated: >60 mL/min (ref >60)
Glucose, Bld: 120 mg/dL — ABNORMAL HIGH (ref 70–99)
Potassium: 3.8 mmol/L (ref 3.5–5.1)
Sodium: 133 mmol/L — ABNORMAL LOW (ref 135–145)

## 2021-08-06 LAB — CBC WITH DIFFERENTIAL/PLATELET
Abs Immature Granulocytes: 0.02 10*3/uL (ref 0.00–0.07)
Basophils Absolute: 0 10*3/uL (ref 0.0–0.1)
Basophils Relative: 0 %
Eosinophils Absolute: 0 10*3/uL (ref 0.0–0.5)
Eosinophils Relative: 0 %
HCT: 30.4 % — ABNORMAL LOW (ref 39.0–52.0)
Hemoglobin: 9.3 g/dL — ABNORMAL LOW (ref 13.0–17.0)
Immature Granulocytes: 0 %
Lymphocytes Relative: 20 %
Lymphs Abs: 1.4 10*3/uL (ref 0.7–4.0)
MCH: 24.3 pg — ABNORMAL LOW (ref 26.0–34.0)
MCHC: 30.6 g/dL (ref 30.0–36.0)
MCV: 79.6 fL — ABNORMAL LOW (ref 80.0–100.0)
Monocytes Absolute: 0.7 10*3/uL (ref 0.1–1.0)
Monocytes Relative: 10 %
Neutro Abs: 5 10*3/uL (ref 1.7–7.7)
Neutrophils Relative %: 70 %
Platelets: 277 10*3/uL (ref 150–400)
RBC: 3.82 MIL/uL — ABNORMAL LOW (ref 4.22–5.81)
RDW: 17.4 % — ABNORMAL HIGH (ref 11.5–15.5)
WBC: 7.2 10*3/uL (ref 4.0–10.5)
nRBC: 0.4 % — ABNORMAL HIGH (ref 0.0–0.2)

## 2021-08-06 LAB — TROPONIN I (HIGH SENSITIVITY)
Troponin I (High Sensitivity): 14 ng/L (ref <18)
Troponin I (High Sensitivity): 14 ng/L (ref <18)

## 2021-08-06 LAB — BRAIN NATRIURETIC PEPTIDE: B Natriuretic Peptide: 3697.9 pg/mL — ABNORMAL HIGH (ref 0.0–100.0)

## 2021-08-06 MED ORDER — SENNOSIDES-DOCUSATE SODIUM 8.6-50 MG PO TABS: 1.0000 | ORAL_TABLET | Freq: Every evening | ORAL | Status: DC | PRN

## 2021-08-06 MED ORDER — FUROSEMIDE 10 MG/ML IJ SOLN
40.0000 mg | Freq: Two times a day (BID) | INTRAMUSCULAR | Status: DC
  Administered 2021-08-06 – 2021-08-08 (×4): 40 mg via INTRAVENOUS
  Filled 2021-08-06 (×4): qty 4

## 2021-08-06 MED ORDER — ACETAMINOPHEN 325 MG PO TABS: 650.0000 mg | ORAL_TABLET | Freq: Four times a day (QID) | ORAL | Status: DC | PRN

## 2021-08-06 MED ORDER — IOHEXOL 350 MG/ML SOLN
75.0000 mL | Freq: Once | INTRAVENOUS | Status: AC | PRN
  Administered 2021-08-06: 75 mL via INTRAVENOUS

## 2021-08-06 MED ORDER — DIGOXIN 125 MCG PO TABS
0.1250 mg | ORAL_TABLET | Freq: Every day | ORAL | Status: DC
  Administered 2021-08-07 – 2021-08-08 (×2): 0.125 mg via ORAL
  Filled 2021-08-06 (×2): qty 1

## 2021-08-06 MED ORDER — ONDANSETRON HCL 4 MG/2ML IJ SOLN: 4.0000 mg | Freq: Four times a day (QID) | INTRAMUSCULAR | Status: DC | PRN

## 2021-08-06 MED ORDER — ONDANSETRON HCL 4 MG PO TABS
4.0000 mg | ORAL_TABLET | Freq: Four times a day (QID) | ORAL | Status: DC | PRN
  Administered 2021-08-07: 4 mg via ORAL
  Filled 2021-08-06: qty 1

## 2021-08-06 MED ORDER — ACETAMINOPHEN 650 MG RE SUPP: 650.0000 mg | Freq: Four times a day (QID) | RECTAL | Status: DC | PRN

## 2021-08-06 MED ORDER — ALBUTEROL SULFATE (2.5 MG/3ML) 0.083% IN NEBU: 2.5000 mg | INHALATION_SOLUTION | Freq: Four times a day (QID) | RESPIRATORY_TRACT | Status: DC | PRN

## 2021-08-06 MED ORDER — SPIRONOLACTONE 25 MG PO TABS
25.0000 mg | ORAL_TABLET | Freq: Every day | ORAL | Status: DC
  Administered 2021-08-07 – 2021-08-08 (×2): 25 mg via ORAL
  Filled 2021-08-06 (×2): qty 1

## 2021-08-06 MED ORDER — SACUBITRIL-VALSARTAN 24-26 MG PO TABS
1.0000 | ORAL_TABLET | Freq: Two times a day (BID) | ORAL | Status: DC
  Administered 2021-08-06 – 2021-08-08 (×4): 1 via ORAL
  Filled 2021-08-06 (×4): qty 1

## 2021-08-06 MED ORDER — HEPARIN (PORCINE) 25000 UT/250ML-% IV SOLN
2200.0000 [IU]/h | INTRAVENOUS | Status: DC
  Administered 2021-08-06: 1750 [IU]/h via INTRAVENOUS
  Administered 2021-08-07 – 2021-08-08 (×2): 2200 [IU]/h via INTRAVENOUS
  Filled 2021-08-06 (×3): qty 250

## 2021-08-06 MED ORDER — SODIUM CHLORIDE 0.9% FLUSH
3.0000 mL | Freq: Two times a day (BID) | INTRAVENOUS | Status: DC
  Administered 2021-08-06 – 2021-08-08 (×3): 3 mL via INTRAVENOUS

## 2021-08-06 MED ORDER — HEPARIN BOLUS VIA INFUSION
2700.0000 [IU] | Freq: Once | INTRAVENOUS | Status: AC
  Administered 2021-08-06: 2700 [IU] via INTRAVENOUS
  Filled 2021-08-06: qty 2700

## 2021-08-06 NOTE — Progress Notes (Signed)
Lower extremity venous right study completed.  Preliminary results relayed to PA in ED.   See CV Proc for preliminary results report.   Briya Lookabaugh, RDMS, RVT  

## 2021-08-06 NOTE — ED Provider Notes (Signed)
[facility]-EMERGENCY DEPT Provider Note   CSN: [id number] Arrival date & time: 08/06/21  1222     History  Chief Complaint  Patient presents with   Leg Swelling   Fluid Retention    Carlos Mckinney is a 26 y.o. male.  With past medical history of congestive heart failure who presents to the emergency department bilateral lower extremity swelling.  Patient states that he has had usual bilateral lower extremity edema secondary to his known congestive heart failure.  Patient states that he has been taking 40 of Lasix daily and increase Lasix for 2 to 3 days over the past week to 80 mg daily trying to get fluid off of his extremities.  He states that he noticed that the right lower leg was swollen worse than the left and he was having pain in the right leg so presents to the ED for evaluation.  He denies any trauma to the leg.  He states he has baseline shortness of breath secondary to his heart failure.  He denies chest pain, palpitations, fevers or upper respiratory symptoms.  Endorses compliance on his home medications including Lasix 40 mg daily, digoxin, Entresto, Aldactone.  Patient was recently seen in the emergency department on 07/18/2021 for acute on chronic heart failure.  He was given 40 mg IV Lasix here in the emergency department with good response afterward.  He was discharged.  He sees Dr. Gala Romney, with [facility].  Per his most recent audiology visit on 06/07/2021, patient had echo on 8/22 with EF of 15 to 20%, mildly dilated RV. HPI     Home Medications Prior to Admission medications   Medication Sig Start Date End Date Taking? Authorizing Provider  digoxin (LANOXIN) 0.125 MG tablet Take 1 tablet (0.125 mg total) by mouth daily. 06/07/21   Milford, Anderson Malta, FNP  furosemide (LASIX) 40 MG tablet Take 1 tablet (40 mg total) by mouth daily. 06/07/21   Milford, Anderson Malta, FNP  polyethylene glycol (MIRALAX) 17 g packet Take 17 g by mouth daily. 07/08/21    Lamptey, Britta Mccreedy, MD  sacubitril-valsartan (ENTRESTO) 24-26 MG Take 1 tablet by mouth 2 (two) times daily. 07/19/21   Bensimhon, Bevelyn Buckles, MD  spironolactone (ALDACTONE) 25 MG tablet Take 1 tablet (25 mg total) by mouth daily. 05/06/21   Bensimhon, Bevelyn Buckles, MD      Allergies    Patient has no known allergies.    Review of Systems   Review of Systems  Constitutional:  Negative for fever.  Respiratory:  Positive for shortness of breath. Negative for cough.   Cardiovascular:  Positive for leg swelling. Negative for chest pain and palpitations.  Gastrointestinal:  Negative for nausea.  Musculoskeletal:  Positive for myalgias.  Neurological:  Negative for syncope.  All other systems reviewed and are negative.  Physical Exam Updated Vital Signs BP (!) 117/102 (BP Location: Right Arm)    Pulse (!) 109    Temp 98.1 F (36.7 C) (Oral)    Resp 16    Ht 5\' 11"  (1.803 m)    Wt 102.1 kg    SpO2 100%    BMI 31.38 kg/m  Physical Exam Vitals and nursing note reviewed.  Constitutional:      General: He is not in acute distress.    Appearance: Normal appearance. He is not ill-appearing or diaphoretic.  HENT:     Head: Normocephalic and atraumatic.     Mouth/Throat:     Mouth: Mucous membranes are  moist.     Pharynx: Oropharynx is clear.  Eyes:     General: No scleral icterus.    Extraocular Movements: Extraocular movements intact.     Pupils: Pupils are equal, round, and reactive to light.  Cardiovascular:     Rate and Rhythm: Regular rhythm. Tachycardia present.     Pulses: Normal pulses.     Heart sounds: No murmur heard. Pulmonary:     Effort: Tachypnea present. No respiratory distress.     Breath sounds: Normal breath sounds.  Abdominal:     General: Bowel sounds are normal.     Palpations: Abdomen is soft.  Musculoskeletal:     Cervical back: Normal range of motion.     Right lower leg: Tenderness present. 2+ Edema present.     Left lower leg: 1+ Edema present.     Comments:  +Homans  Skin:    General: Skin is warm and dry.     Capillary Refill: Capillary refill takes less than 2 seconds.  Neurological:     General: No focal deficit present.     Mental Status: He is alert and oriented to person, place, and time. Mental status is at baseline.  Psychiatric:        Mood and Affect: Mood normal.        Behavior: Behavior normal.        Thought Content: Thought content normal.        Judgment: Judgment normal.    ED Results / Procedures / Treatments   Labs (all labs ordered are listed, but only abnormal results are displayed) Labs Reviewed  CBC WITH DIFFERENTIAL/PLATELET - Abnormal; Notable for the following components:      Result Value   RBC 3.82 (*)    Hemoglobin 9.3 (*)    HCT 30.4 (*)    MCV 79.6 (*)    MCH 24.3 (*)    RDW 17.4 (*)    nRBC 0.4 (*)    All other components within normal limits  BASIC METABOLIC PANEL - Abnormal; Notable for the following components:   Sodium 133 (*)    Glucose, Bld 120 (*)    Creatinine, Ser 1.35 (*)    All other components within normal limits  BRAIN NATRIURETIC PEPTIDE - Abnormal; Notable for the following components:   B Natriuretic Peptide 3,697.9 (*)    All other components within normal limits  TROPONIN I (HIGH SENSITIVITY)  TROPONIN I (HIGH SENSITIVITY)   EKG EKG Interpretation  Date/Time:  Saturday August 06 2021 16:26:57 EST Ventricular Rate:  108 PR Interval:  152 QRS Duration: 86 QT Interval:  332 QTC Calculation: 444 R Axis:   -13 Text Interpretation: Sinus tachycardia Low voltage QRS Possible Inferior infarct , age undetermined Abnormal ECG Confirmed by Gloris Manchester [id number]) on 08/06/2021 5:49:13 PM  Radiology CT Angio Chest PE W/Cm &/Or Wo Cm  Result Date: 08/06/2021 CLINICAL DATA:  Shortness of breath. History of heart failure and DVT. EXAM: CT ANGIOGRAPHY CHEST WITH CONTRAST TECHNIQUE: Multidetector CT imaging of the chest was performed using the standard protocol during bolus administration of  intravenous contrast. Multiplanar CT image reconstructions and MIPs were obtained to evaluate the vascular anatomy. CONTRAST:  75mL OMNIPAQUE IOHEXOL 350 MG/ML SOLN COMPARISON:  Chest x-ray dated July 19, 2021. FINDINGS: Cardiovascular: Satisfactory opacification of the pulmonary arteries to the segmental level. Small subsegmental pulmonary emboli in both upper lobes and the right lower lobe. Moderate cardiomegaly. No right heart strain. Small pericardial effusion. Reflux of contrast into  the IVC and hepatic veins. No thoracic aortic aneurysm. Mediastinum/Nodes: No enlarged mediastinal, hilar, or axillary lymph nodes. Thyroid gland, trachea, and esophagus demonstrate no significant findings. Lungs/Pleura: Small peripheral consolidations/ground-glass densities in the lingula, anterior right upper lobe, posterior right middle lobe, and anterolateral right lower lobe, suspicious for pulmonary infarcts. Trace right pleural effusion. No pneumothorax. Upper Abdomen: No acute abnormality. Small perihepatic and perisplenic ascites. Musculoskeletal: Bilateral gynecomastia. Anasarca. No acute or significant osseous findings. Review of the MIP images confirms the above findings. IMPRESSION: 1. Small subsegmental pulmonary emboli in both upper lobes and the right lower lobe with associated small pulmonary infarcts. 2. Trace right pleural effusion. 3. Moderate cardiomegaly with small pericardial effusion and reflux of contrast into the IVC and hepatic veins, consistent with right heart dysfunction. 4. Small upper abdominal ascites.  Anasarca. Critical Value/emergent results were called by telephone at the time of interpretation on 08/06/2021 at 5:31 pm to provider Mertha Baars, who verbally acknowledged these results. Electronically Signed   By: Obie Dredge M.D.   On: 08/06/2021 17:31   VAS [place] LOWER EXTREMITY VENOUS (DVT) (ONLY [facility] & [facility])  Result Date: 08/06/2021  Lower Venous DVT Study Patient Name:  JUWANN SHERK   Date of Exam:   08/06/2021 Medical Rec #: [id number]        Accession #:    [id number] Date of Birth: 1995/12/18         Patient Gender: M Patient Age:   35 years Exam Location:  [facility] Procedure:      VAS [place] LOWER EXTREMITY VENOUS (DVT) Referring Phys: Delice Bison --------------------------------------------------------------------------------  Indications: Bilateral swelling, new pain in right calf.  Limitations: Patient guarding, intolerant to compression at some segments. Comparison Study: No prior studies. Performing Technologist: Jean Rosenthal RDMS, RVT  Examination Guidelines: A complete evaluation includes B-mode imaging, spectral Doppler, color Doppler, and power Doppler as needed of all accessible portions of each vessel. Bilateral testing is considered an integral part of a complete examination. Limited examinations for reoccurring indications may be performed as noted. The reflux portion of the exam is performed with the patient in reverse Trendelenburg.  +---------+---------------+---------+-----------+----------+--------------+  RIGHT     Compressibility Phasicity Spontaneity Properties Thrombus Aging  +---------+---------------+---------+-----------+----------+--------------+  CFV                       Yes       Yes                                    +---------+---------------+---------+-----------+----------+--------------+  SFJ                       Yes       Yes                                    +---------+---------------+---------+-----------+----------+--------------+  FV Prox   Full                                                             +---------+---------------+---------+-----------+----------+--------------+  FV Mid    Full                                                             +---------+---------------+---------+-----------+----------+--------------+  FV Distal Full                                                              +---------+---------------+---------+-----------+----------+--------------+  PFV       Full                                                             +---------+---------------+---------+-----------+----------+--------------+  POP       Full            Yes       Yes                                    +---------+---------------+---------+-----------+----------+--------------+  PTV       Partial                                          Acute           +---------+---------------+---------+-----------+----------+--------------+  PERO      Full                                                             +---------+---------------+---------+-----------+----------+--------------+  Soleal    None            No        No                     Acute           +---------+---------------+---------+-----------+----------+--------------+  Gastroc   Full                                                             +---------+---------------+---------+-----------+----------+--------------+   +----+---------------+---------+-----------+----------+--------------+  LEFT Compressibility Phasicity Spontaneity Properties Thrombus Aging  +----+---------------+---------+-----------+----------+--------------+  CFV                  Yes       Yes                                    +----+---------------+---------+-----------+----------+--------------+    Summary: RIGHT: - Findings consistent with acute deep vein thrombosis involving the right posterior tibial veins, and right soleal veins. - No cystic structure found in the popliteal fossa.  LEFT: - No evidence of common femoral vein obstruction.  *See table(s) above for measurements and observations.    Preliminary     Procedures .Critical Care Performed by: Cristopher Peru, PA-C Authorized by: Cherlynn Polo,  Lorriane Shire, PA-C   Critical care provider statement:    Critical care time (minutes):  35   Critical care time was exclusive of:  Separately billable procedures and treating other  patients   Critical care was necessary to treat or prevent imminent or life-threatening deterioration of the following conditions:  Cardiac failure   Critical care was time spent personally by me on the following activities:  Development of treatment plan with patient or surrogate, discussions with consultants, evaluation of patient's response to treatment, examination of patient, interpretation of cardiac output measurements, obtaining history from patient or surrogate, re-evaluation of patient's condition, review of old charts, pulse oximetry, ordering and review of radiographic studies, ordering and review of laboratory studies and ordering and performing treatments and interventions   I assumed direction of critical care for this patient from another provider in my specialty: no     Care discussed with: admitting provider      Medications Ordered in ED Medications  heparin bolus via infusion 2,700 Units (has no administration in time range)  heparin ADULT infusion 100 units/mL (25000 units/277mL) (has no administration in time range)  iohexol (OMNIPAQUE) 350 MG/ML injection 75 mL (75 mLs Intravenous Contrast Given 08/06/21 1657)   ED Course/ Medical Decision Making/ A&P  Medical Decision Making This patient presents to the ED for concern of DVT, this involves an extensive number of treatment options, and is a complaint that carries with it a high risk of complications and morbidity.  The differential diagnosis includes DVT, cellulitis, DVT with pulmonary embolism, fracture with compartment syndrome   Co morbidities that complicate the patient evaluation  Heart failure   Additional history obtained:  Additional history obtained from patient and family External records from outside source obtained and reviewed including previous admission records and emergency department visits   Lab Tests:  I Ordered, and personally interpreted labs.  The pertinent results include:   CBC with  hemoglobin 9.3 Creatinine 1.35, similar to previous Initial troponin 14, delta Trop pending BNP 3697, elevated by > 1000 from previous 2 weeks ago  Imaging Studies ordered:  I ordered imaging studies including vascular lower extremity DVT study showing posterior tibial and soleus DVTs.  Additionally ordered CTA PE study which demonstrates small segmental pulmonary emboli in the upper lobes bilaterally and the right lower lobe with associated small pulmonary infarcts.  Trace right pleural effusion.  Cardiomegaly with small pericardial effusion I independently visualized and interpreted imaging which showed same as mentioned above I agree with the radiologist interpretation   Cardiac Monitoring:  The patient was maintained on a cardiac monitor.  I personally viewed and interpreted the cardiac monitored which showed an underlying rhythm of: Sinus tachycardia   Medicines ordered and prescription drug management:  I ordered medication including heparin drip for pulmonary embolism Reevaluation of the patient after these medicines showed that the patient stayed the same I have reviewed the patients home medicines and have made adjustments as needed   Test Considered:  Chest x-ray, however obtaining CTA PE study which will cover need for chest x-ray.   Critical Interventions:  Initiated heparin drip for DVT and PE.   Consultations Obtained:  I requested consultation with the hospitalist,  and discussed lab and imaging findings as well as pertinent plan - they recommend: Admission   Problem List / ED Course:  Right lower extremity swelling Shortness of breath 26 year old male with past medical history of congestive heart failure with last EF of 25 to 30% presents to  the emergency department with right lower extremity swelling and shortness of breath.  Vascular ultrasound DVT study of the right lower extremity shows distal DVT needs.  Given shortness of breath obtain CTA PE study  which was positive for bilateral upper subsegmental PEs, right lower lobe and right middle lobe pulmonary embolisms with small pulmonary infarcts. Patient has been tachycardic to 110 in the emergency department with subjective shortness of breath.  He does have baseline shortness of breath with his congestive heart failure however concerned that current shortness of breath may be due to pulmonary embolisms.  He has had lower blood pressures to 85 systolic however not requiring pressors.  Patient is not hypoxic requiring oxygen. BNP significantly elevated to 3600 which is greater than 2000 from previous values.  He does have a history of right ventricular dysfunction which complicates clinical picture and difficult evaluate for right ventricular strain due to pulmonary embolisms.  Patient was started on heparin drip.  Consulted hospitalist who agrees for admission at this time.  Spoke with patient and family member at bedside who also both agree to admission at this time.  Reevaluation:  After the interventions noted above, I reevaluated the patient and found that they have :stayed the same   Social Determinants of Health:  Patient was previously incarcerated and has subsequent homelessness.   Dispostion:  After consideration of the diagnostic results and the patients response to treatment, I feel that the patent would benefit from admission.  Patient requires IV heparin drip and stabilization prior to discharge home due to DVT and pulmonary embolism.  He is a patient that has higher risk for decompensation given his congestive heart failure at baseline.  Spoke with Dr. Durwin Nora, attending who agrees with admission. Spoke with Dr. Allena Katz, hospitalist who agrees with admission.   Final Clinical Impression(s) / ED Diagnoses Final diagnoses:  Acute deep vein thrombosis (DVT) of tibial vein of right lower extremity (HCC)  Multiple subsegmental pulmonary emboli without acute cor pulmonale [facility])    Rx /  DC Orders ED Discharge Orders     None         Cristopher Peru, PA-C 08/06/21 1815    Gloris Manchester, MD 08/10/21 [phone]

## 2021-08-06 NOTE — ED Notes (Signed)
This nurse was at nurse's station when pt, who was holding cardiac leads in hands, limping down ED hallway, who appeared to have unhooked himself from his monitor, stated to this nurse, "I need a box of napkins, my nose is running." This nurse stated to the pt that he is in heart failure, is breathing 40x/min, is tachycardic, and is c/o SOB. This nurse re-iterated to the pt the importance of staying attached to the cardiac monitor and to please use the call-light if he needed assistance.

## 2021-08-06 NOTE — ED Notes (Signed)
Pt argumentative regarding second IV access being started. This nurse explained to the pt that second access is beneficial given he has a Heparin drip infusing.

## 2021-08-06 NOTE — Progress Notes (Signed)
ANTICOAGULATION CONSULT NOTE - Initial Consult  Pharmacy Consult for heparin Indication: pulmonary embolus  No Known Allergies  Patient Measurements: Height: 5\' 11"  (180.3 cm) Weight: 102.1 kg (225 lb) IBW/kg (Calculated) : 75.3 Heparin Dosing Weight: 96 kg  Vital Signs: Temp: 98.1 F (36.7 C) (01/07 1233) Temp Source: Oral (01/07 1233) BP: 113/86 (01/07 1723) Pulse Rate: 106 (01/07 1723)  Labs: Recent Labs    08/06/21 1248 08/06/21 1629  HGB 9.3*  --   HCT 30.4*  --   PLT 277  --   CREATININE 1.35*  --   TROPONINIHS  --  14    Estimated Creatinine Clearance: 100.9 mL/min (A) (by C-G formula based on SCr of 1.35 mg/dL (H)).   Medical History: Past Medical History:  Diagnosis Date   Heart failure (HCC)     Medications: Pt not taking anticoagulants PTA  Assessment: Pt is a 3 yoM with PMH significant for heart failure presenting to the ED with leg swelling. Venous doppler reveals acute DVT involving right posterior tibial veins and right soleal veins. CTA reveals small subsegmental PE in both upper lobes and the right lower lobe with associated small pulmonary infarcts.   Pharmacy consulted to dose heparin for acute PE/DVT.  Today, 08/06/21 Hgb low on admission; Plt WNL SCr 1.35, CrCl ~100 mL/min  Goal of Therapy:  Heparin level 0.3-0.7 units/ml Monitor platelets by anticoagulation protocol: Yes   Plan:  Give 2700 units bolus x 1 Start heparin infusion at 1750 units/hr Check anti-Xa level in 6 hours and daily while on heparin Continue to monitor H&H and platelets Monitor for signs of bleeding  10/04/21, PharmD 08/06/2021,5:53 PM

## 2021-08-06 NOTE — ED Provider Triage Note (Signed)
Emergency Medicine Provider Triage Evaluation Note  Carlos Mckinney , a 26 y.o. male  was evaluated in triage.  Pt complains of bilateral lower extremity swelling. Patient recently admitted and discharged from [facility] due to same symptoms. Patient reports he is compliant on his 40mg  of Lasix daily but that LE swelling persists. Patient also complaining of R calf tenderness that is new.   Review of Systems  Positive: Bilat lower extremity swelling, SOB when lying flat, right calf tenderness Negative: CP, Ab pain  Physical Exam  BP (!) 117/102 (BP Location: Right Arm)    Pulse (!) 109    Temp 98.1 F (36.7 C) (Oral)    Resp 16    Ht 5\' 11"  (1.803 m)    Wt 102.1 kg    SpO2 100%    BMI 31.38 kg/m  Gen:   Awake, no distress   Resp:  Normal effort  MSK:   Moves extremities without difficulty. Patient right calf tender to palpation Other:  Patient has 2+ pitting edema noted to bilateral lower extremities. Patient right calf tender. + Homans sign.   Medical Decision Making  Medically screening exam initiated at 12:41 PM.  Appropriate orders placed.  SARATH PRIVOTT was informed that the remainder of the evaluation will be completed by another provider, this initial triage assessment does not replace that evaluation, and the importance of remaining in the ED until their evaluation is complete.     , PA-C 08/06/21 1244

## 2021-08-06 NOTE — ED Triage Notes (Signed)
Pt presents with swelling in bil legs, rt worse than rt. Painful when walking, + Homans sign in  rt leg

## 2021-08-06 NOTE — H&P (Signed)
History and Physical    Carlos Mckinney YSA:[id number] DOB: 01/16/1996 DOA: 08/06/2021  PCP: Pcp, No  Patient coming from: Home  I have personally briefly reviewed patient's old medical records in [facility]  Chief Complaint: Right lower extremity swelling  HPI: Carlos Mckinney is a 26 y.o. male with medical history significant for HFrEF (15-20%) and cocaine use who presented to the ED for evaluation of lower extremity swelling and pain.  Patient states that he had increased swelling to both of his legs over the last week.  He felt like he was retaining fluid.  He noticed that his right leg was swollen more compared to the left.  Today he developed new pain in the back of his right leg therefore came to the ED for further evaluation.  Patient also reports shortness of breath, worse at night, when laying flat, and with exertion.  He is waking up short of breath in the middle of the night.  He has cough productive of clear sputum.  He has been having clear rhinorrhea but denies epistaxis.  He denies any chest pain.  He has had some nausea without emesis.  He states that he has been taking Lasix 40 mg daily by last less urine output than expected.  He has also been taking his Sherryll Burger but states that he has not been taking his spironolactone or digoxin but does not provide a reason why.  He does state that he has difficulty swallowing pills but can take chewable tablets.  Patient denies any recent surgery or travel.  He reports a history of blood clot in his father.  He does report recent sedentary lifestyle.  Patient states that he has cut back on his cigarette smoking significantly.  He denies any alcohol or illicit drug use recently.  ED Course:  Initial vitals showed BP 117/102, pulse 109, RR 16, temp 98.1 F, SPO2 100% on room air.  Labs show WBC 7.2, hemoglobin 9.3, platelets 277,000, sodium 133, potassium 3.8, bicarb 24, BUN 15, creatinine 1.35, serum glucose 120, troponin 14, BNP  3697.9.  Right lower extremity venous ultrasound shows findings consistent with acute DVT involving the right posterior tibial veins and right soleal veins.  CTA chest PE study shows small subsegmental pulmonary emboli in both upper lobes in the right lower lobe with associated small pulmonary infarcts.  Trace right pleural effusion seen.  Moderate cardiomegaly with small pericardial effusion and reflux of contrast into the IVC and hepatic veins consistent with right heart dysfunction noted.  Patient was started on IV heparin and the hospitalist service was consulted to admit for further evaluation and management.  Review of Systems: All systems reviewed and are negative except as documented in history of present illness above.   Past Medical History:  Diagnosis Date   Heart failure (HCC)     History reviewed. No pertinent surgical history.  Social History:  reports that he has quit smoking. His smoking use included cigarettes. He has never used smokeless tobacco. He reports that he does not drink alcohol and does not use drugs.  No Known Allergies  Family History  Family history unknown: Yes     Prior to Admission medications   Medication Sig Start Date End Date Taking? Authorizing Provider  albuterol (VENTOLIN HFA) 108 (90 Base) MCG/ACT inhaler Inhale 1-2 puffs into the lungs every 6 (six) hours as needed for wheezing or shortness of breath.   Yes [provider]  Fluticasone Propionate HFA (FLOVENT HFA IN) Inhale  1 puff into the lungs 2 (two) times daily as needed (for flares).   Yes [provider]  furosemide (LASIX) 40 MG tablet Take 1 tablet (40 mg total) by mouth daily. Patient taking differently: Take 40 mg by mouth 2 (two) times daily. 06/07/21  Yes Milford, Anderson Malta, FNP  sacubitril-valsartan (ENTRESTO) 24-26 MG Take 1 tablet by mouth 2 (two) times daily. 07/19/21  Yes Bensimhon, Bevelyn Buckles, MD  digoxin (LANOXIN) 0.125 MG tablet Take 1 tablet (0.125 mg  total) by mouth daily. Patient not taking: Reported on 08/06/2021 06/07/21   Jacklynn Ganong, FNP  polyethylene glycol (MIRALAX) 17 g packet Take 17 g by mouth daily. Patient not taking: Reported on 08/06/2021 07/08/21   Merrilee Jansky, MD  spironolactone (ALDACTONE) 25 MG tablet Take 1 tablet (25 mg total) by mouth daily. Patient not taking: Reported on 08/06/2021 05/06/21   Dolores Patty, MD    Physical Exam: Vitals:   08/06/21 1239 08/06/21 1634 08/06/21 1650 08/06/21 1723  BP:  (!) 124/108 (!) 133/91 113/86  Pulse:  (!) 109 (!) 110 (!) 106  Resp:  16 (!) 42 20  Temp:      TempSrc:      SpO2:  100%  100%  Weight: 102.1 kg     Height:  (1.803 m)      Constitutional: Resting in bed, NAD, calm, comfortable Eyes: PERRL, lids and conjunctivae normal ENMT: Mucous membranes are moist. Posterior pharynx clear of any exudate or lesions.Normal dentition.  Neck: normal, supple, no masses. Respiratory: clear to auscultation bilaterally, no wheezing, no crackles. Normal respiratory effort. No accessory muscle use.  Cardiovascular: Tachycardic, no murmurs / rubs / gallops.  Trace bilateral lower extremity edema, right greater than sign left. 2+ pedal pulses. Abdomen: no tenderness, no masses palpated. No hepatosplenomegaly. Bowel sounds positive.  Musculoskeletal: no clubbing / cyanosis. No joint deformity upper and lower extremities. Good ROM, no contractures. Normal muscle tone.  Skin: no rashes, lesions, ulcers. No induration Neurologic: CN 2-12 grossly intact. Sensation intact. Strength 5/5 in all 4.  Psychiatric: Alert and oriented x 3. Normal mood.   Labs on Admission: I have personally reviewed following labs and imaging studies  CBC: Recent Labs  Lab 08/06/21 1248  WBC 7.2  NEUTROABS 5.0  HGB 9.3*  HCT 30.4*  MCV 79.6*  PLT 277   Basic Metabolic Panel: Recent Labs  Lab 08/06/21 1248  NA 133*  K 3.8  CL 100  CO2 24  GLUCOSE 120*  BUN 15  CREATININE 1.35*   CALCIUM 8.9   GFR: Estimated Creatinine Clearance: 100.9 mL/min (A) (by C-G formula based on SCr of 1.35 mg/dL (H)). Liver Function Tests: No results for input(s): AST, ALT, ALKPHOS, BILITOT, PROT, ALBUMIN in the last 168 hours. No results for input(s): LIPASE, AMYLASE in the last 168 hours. No results for input(s): AMMONIA in the last 168 hours. Coagulation Profile: No results for input(s): INR, PROTIME in the last 168 hours. Cardiac Enzymes: No results for input(s): CKTOTAL, CKMB, CKMBINDEX, TROPONINI in the last 168 hours. BNP (last 3 results) No results for input(s): PROBNP in the last 8760 hours. HbA1C: No results for input(s): HGBA1C in the last 72 hours. CBG: No results for input(s): GLUCAP in the last 168 hours. Lipid Profile: No results for input(s): CHOL, HDL, LDLCALC, TRIG, CHOLHDL, LDLDIRECT in the last 72 hours. Thyroid Function Tests: No results for input(s): TSH, T4TOTAL, FREET4, T3FREE, THYROIDAB in the last 72 hours. Anemia Panel: No  results for input(s): VITAMINB12, FOLATE, FERRITIN, TIBC, IRON, RETICCTPCT in the last 72 hours. Urine analysis: No results found for: COLORURINE, APPEARANCEUR, LABSPEC, PHURINE, GLUCOSEU, HGBUR, BILIRUBINUR, KETONESUR, PROTEINUR, UROBILINOGEN, NITRITE, LEUKOCYTESUR  Radiological Exams on Admission: CT Angio Chest PE W/Cm &/Or Wo Cm  Result Date: 08/06/2021 CLINICAL DATA:  Shortness of breath. History of heart failure and DVT. EXAM: CT ANGIOGRAPHY CHEST WITH CONTRAST TECHNIQUE: Multidetector CT imaging of the chest was performed using the standard protocol during bolus administration of intravenous contrast. Multiplanar CT image reconstructions and MIPs were obtained to evaluate the vascular anatomy. CONTRAST:  43mL OMNIPAQUE IOHEXOL 350 MG/ML SOLN COMPARISON:  Chest x-ray dated July 19, 2021. FINDINGS: Cardiovascular: Satisfactory opacification of the pulmonary arteries to the segmental level. Small subsegmental pulmonary emboli in  both upper lobes and the right lower lobe. Moderate cardiomegaly. No right heart strain. Small pericardial effusion. Reflux of contrast into the IVC and hepatic veins. No thoracic aortic aneurysm. Mediastinum/Nodes: No enlarged mediastinal, hilar, or axillary lymph nodes. Thyroid gland, trachea, and esophagus demonstrate no significant findings. Lungs/Pleura: Small peripheral consolidations/ground-glass densities in the lingula, anterior right upper lobe, posterior right middle lobe, and anterolateral right lower lobe, suspicious for pulmonary infarcts. Trace right pleural effusion. No pneumothorax. Upper Abdomen: No acute abnormality. Small perihepatic and perisplenic ascites. Musculoskeletal: Bilateral gynecomastia. Anasarca. No acute or significant osseous findings. Review of the MIP images confirms the above findings. IMPRESSION: 1. Small subsegmental pulmonary emboli in both upper lobes and the right lower lobe with associated small pulmonary infarcts. 2. Trace right pleural effusion. 3. Moderate cardiomegaly with small pericardial effusion and reflux of contrast into the IVC and hepatic veins, consistent with right heart dysfunction. 4. Small upper abdominal ascites.  Anasarca. Critical Value/emergent results were called by telephone at the time of interpretation on 08/06/2021 at 5:31 pm to provider Mertha Baars, who verbally acknowledged these results. Electronically Signed   By: Obie Dredge M.D.   On: 08/06/2021 17:31   VAS [place] LOWER EXTREMITY VENOUS (DVT) (ONLY [facility] & [facility])  Result Date: 08/06/2021  Lower Venous DVT Study Patient Name:  Carlos Mckinney  Date of Exam:   08/06/2021 Medical Rec #: [id number]        Accession #:    [id number] Date of Birth: 10-19-1995         Patient Gender: M Patient Age:   32 years Exam Location:  [facility] Procedure:      VAS [place] LOWER EXTREMITY VENOUS (DVT) Referring Phys: Delice Bison --------------------------------------------------------------------------------   Indications: Bilateral swelling, new pain in right calf.  Limitations: Patient guarding, intolerant to compression at some segments. Comparison Study: No prior studies. Performing Technologist: Jean Rosenthal RDMS, RVT  Examination Guidelines: A complete evaluation includes B-mode imaging, spectral Doppler, color Doppler, and power Doppler as needed of all accessible portions of each vessel. Bilateral testing is considered an integral part of a complete examination. Limited examinations for reoccurring indications may be performed as noted. The reflux portion of the exam is performed with the patient in reverse Trendelenburg.  +---------+---------------+---------+-----------+----------+--------------+  RIGHT     Compressibility Phasicity Spontaneity Properties Thrombus Aging  +---------+---------------+---------+-----------+----------+--------------+  CFV                       Yes       Yes                                    +---------+---------------+---------+-----------+----------+--------------+  SFJ                       Yes       Yes                                    +---------+---------------+---------+-----------+----------+--------------+  FV Prox   Full                                                             +---------+---------------+---------+-----------+----------+--------------+  FV Mid    Full                                                             +---------+---------------+---------+-----------+----------+--------------+  FV Distal Full                                                             +---------+---------------+---------+-----------+----------+--------------+  PFV       Full                                                             +---------+---------------+---------+-----------+----------+--------------+  POP       Full            Yes       Yes                                    +---------+---------------+---------+-----------+----------+--------------+  PTV       Partial                                           Acute           +---------+---------------+---------+-----------+----------+--------------+  PERO      Full                                                             +---------+---------------+---------+-----------+----------+--------------+  Soleal    None            No        No                     Acute           +---------+---------------+---------+-----------+----------+--------------+  Gastroc   Full                                                             +---------+---------------+---------+-----------+----------+--------------+   +----+---------------+---------+-----------+----------+--------------+  LEFT Compressibility Phasicity Spontaneity Properties Thrombus Aging  +----+---------------+---------+-----------+----------+--------------+  CFV                  Yes       Yes                                    +----+---------------+---------+-----------+----------+--------------+    Summary: RIGHT: - Findings consistent with acute deep vein thrombosis involving the right posterior tibial veins, and right soleal veins. - No cystic structure found in the popliteal fossa.  LEFT: - No evidence of common femoral vein obstruction.  *See table(s) above for measurements and observations.    Preliminary     EKG: Personally reviewed. Sinus tachycardia, rate 108, low voltage.  No acute ischemic changes.  PVC no longer present when compared to prior.  Assessment/Plan Principal Problem:   Multiple pulmonary emboli (HCC) Active Problems:   Acute on chronic HFrEF (heart failure with reduced ejection fraction) (HCC)   Acute deep vein thrombosis (DVT) of right lower extremity (HCC)   Anemia   Carlos Mckinney is a 26 y.o. male with medical history significant for HFrEF (15-20%) and cocaine use who is admitted with acute RLE DVT with pulmonary emboli and acute on chronic HFrEF.  Acute DVT of right lower extremity with subsegmental pulmonary emboli: RLE venous ultrasound  shows acute DVT involving the right posterior tibial and soleal veins.  CTA Chest shows small subsegmental PE in both upper lobes and RLL with associated small pulmonary infarcts.  He is currently hemodynamically stable and saturating well on room air.  No clear provoking factor. -Continue IV heparin and transition to oral anticoagulation as able  Acute on chronic HFrEF: Reports increased exertional dyspnea, orthopnea, PND.  Has some peripheral edema on exam.  BNP >3600.  EF is 15-20% per cardiology documentation.  Patient states he has not been taking spironolactone and digoxin as prescribed. -Start on IV Lasix 40 mg twice daily -Continue Entresto 24-26 mg twice daily -Restart spironolactone 25 mg daily and digoxin 0.125 mg daily -Monitor strict I/O's and daily weights -Update echocardiogram  Anemia: Hemoglobin 9.3 on admission without obvious bleeding.  Continue to monitor with anticoagulation.  Check anemia panel.  History of substance use: Denies recent cocaine use.  DVT prophylaxis: IV heparin Code Status: Full code, confirmed with patient on admission Family Communication: Discussed with patient's significant other at bedside Disposition Plan: From home and likely discharge to home pending clinical progress Consults called: None Level of care: Telemetry Admission status:  Status is: Observation  The patient remains OBS appropriate and will d/c before 2 midnights.  Darreld Mclean MD [facility]  If 7PM-7AM, please contact night-coverage [email or web address]  08/06/2021, 6:58 PM

## 2021-08-06 NOTE — ED Notes (Signed)
Verified Heparin dosing with pharmacy.

## 2021-08-06 NOTE — ED Notes (Signed)
Pt in CT at this time.

## 2021-08-06 NOTE — ED Notes (Signed)
Hospitalist at the bedside 

## 2021-08-07 ENCOUNTER — Observation Stay (HOSPITAL_COMMUNITY): Payer: Medicaid Other

## 2021-08-07 DIAGNOSIS — M7989 Other specified soft tissue disorders: Secondary | ICD-10-CM | POA: Diagnosis not present

## 2021-08-07 DIAGNOSIS — Z20822 Contact with and (suspected) exposure to covid-19: Secondary | ICD-10-CM | POA: Diagnosis not present

## 2021-08-07 DIAGNOSIS — I2699 Other pulmonary embolism without acute cor pulmonale: Secondary | ICD-10-CM | POA: Diagnosis not present

## 2021-08-07 DIAGNOSIS — I2694 Multiple subsegmental pulmonary emboli without acute cor pulmonale: Secondary | ICD-10-CM | POA: Diagnosis not present

## 2021-08-07 DIAGNOSIS — Z79899 Other long term (current) drug therapy: Secondary | ICD-10-CM | POA: Diagnosis not present

## 2021-08-07 DIAGNOSIS — I82441 Acute embolism and thrombosis of right tibial vein: Secondary | ICD-10-CM | POA: Diagnosis not present

## 2021-08-07 DIAGNOSIS — D509 Iron deficiency anemia, unspecified: Secondary | ICD-10-CM | POA: Diagnosis not present

## 2021-08-07 DIAGNOSIS — F1410 Cocaine abuse, uncomplicated: Secondary | ICD-10-CM | POA: Diagnosis present

## 2021-08-07 DIAGNOSIS — F17210 Nicotine dependence, cigarettes, uncomplicated: Secondary | ICD-10-CM | POA: Diagnosis not present

## 2021-08-07 DIAGNOSIS — R0602 Shortness of breath: Secondary | ICD-10-CM | POA: Diagnosis not present

## 2021-08-07 DIAGNOSIS — E871 Hypo-osmolality and hyponatremia: Secondary | ICD-10-CM | POA: Diagnosis not present

## 2021-08-07 DIAGNOSIS — Z86711 Personal history of pulmonary embolism: Secondary | ICD-10-CM | POA: Diagnosis not present

## 2021-08-07 DIAGNOSIS — I824Y1 Acute embolism and thrombosis of unspecified deep veins of right proximal lower extremity: Secondary | ICD-10-CM | POA: Diagnosis not present

## 2021-08-07 DIAGNOSIS — I5023 Acute on chronic systolic (congestive) heart failure: Secondary | ICD-10-CM

## 2021-08-07 LAB — BASIC METABOLIC PANEL
Anion gap: 9 (ref 5–15)
BUN: 15 mg/dL (ref 6–20)
CO2: 22 mmol/L (ref 22–32)
Calcium: 8.5 mg/dL — ABNORMAL LOW (ref 8.9–10.3)
Chloride: 101 mmol/L (ref 98–111)
Creatinine, Ser: 1.2 mg/dL (ref 0.61–1.24)
GFR, Estimated: >60 mL/min (ref >60)
Glucose, Bld: 94 mg/dL (ref 70–99)
Potassium: 3.8 mmol/L (ref 3.5–5.1)
Sodium: 132 mmol/L — ABNORMAL LOW (ref 135–145)

## 2021-08-07 LAB — FERRITIN: Ferritin: 21 ng/mL — ABNORMAL LOW (ref 24–336)

## 2021-08-07 LAB — ECHOCARDIOGRAM COMPLETE
AR max vel: 2.51 cm2
AV Area VTI: 2.16 cm2
AV Area mean vel: 2.54 cm2
AV Mean grad: 1 mmHg
AV Peak grad: 2.5 mmHg
Ao pk vel: 0.78 m/s
Area-P 1/2: 6.65 cm2
Calc EF: 13.8 %
Height: 71 in
S' Lateral: 6.6 cm
Single Plane A2C EF: 14.1 %
Single Plane A4C EF: 15.2 %
Weight: 3600 oz

## 2021-08-07 LAB — IRON AND TIBC
Iron: 36 ug/dL — ABNORMAL LOW (ref 45–182)
Saturation Ratios: 9 % — ABNORMAL LOW (ref 17.9–39.5)
TIBC: 406 ug/dL (ref 250–450)
UIBC: 370 ug/dL

## 2021-08-07 LAB — HEPARIN LEVEL (UNFRACTIONATED)
Heparin Unfractionated: <0.1 IU/mL — ABNORMAL LOW (ref 0.30–0.70)
Heparin Unfractionated: 0.26 IU/mL — ABNORMAL LOW (ref 0.30–0.70)
Heparin Unfractionated: 0.24 IU/mL — ABNORMAL LOW (ref 0.30–0.70)

## 2021-08-07 LAB — URINALYSIS, ROUTINE W REFLEX MICROSCOPIC
Bilirubin Urine: NEGATIVE
Glucose, UA: NEGATIVE mg/dL
Hgb urine dipstick: NEGATIVE
Ketones, ur: NEGATIVE mg/dL
Leukocytes,Ua: NEGATIVE
Nitrite: NEGATIVE
Protein, ur: NEGATIVE mg/dL
Specific Gravity, Urine: 1.004 — ABNORMAL LOW (ref 1.005–1.030)
pH: 7 (ref 5.0–8.0)

## 2021-08-07 LAB — HIV ANTIBODY (ROUTINE TESTING W REFLEX): HIV Screen 4th Generation wRfx: NONREACTIVE

## 2021-08-07 LAB — FOLATE: Folate: 7.4 ng/mL (ref >5.9)

## 2021-08-07 LAB — CBC
HCT: 27.8 % — ABNORMAL LOW (ref 39.0–52.0)
Hemoglobin: 8.6 g/dL — ABNORMAL LOW (ref 13.0–17.0)
MCH: 24.1 pg — ABNORMAL LOW (ref 26.0–34.0)
MCHC: 30.9 g/dL (ref 30.0–36.0)
MCV: 77.9 fL — ABNORMAL LOW (ref 80.0–100.0)
Platelets: 263 10*3/uL (ref 150–400)
RBC: 3.57 MIL/uL — ABNORMAL LOW (ref 4.22–5.81)
RDW: 17.3 % — ABNORMAL HIGH (ref 11.5–15.5)
WBC: 7.2 10*3/uL (ref 4.0–10.5)
nRBC: 0.3 % — ABNORMAL HIGH (ref 0.0–0.2)

## 2021-08-07 LAB — MAGNESIUM: Magnesium: 1.7 mg/dL (ref 1.7–2.4)

## 2021-08-07 LAB — RESP PANEL BY RT-PCR (FLU A&B, COVID) ARPGX2
Influenza A by PCR: NEGATIVE
Influenza B by PCR: NEGATIVE
SARS Coronavirus 2 by RT PCR: NEGATIVE

## 2021-08-07 LAB — VITAMIN B12: Vitamin B-12: 691 pg/mL (ref 180–914)

## 2021-08-07 MED ORDER — HEPARIN BOLUS VIA INFUSION
1400.0000 [IU] | Freq: Once | INTRAVENOUS | Status: AC
  Administered 2021-08-07: 1400 [IU] via INTRAVENOUS
  Filled 2021-08-07: qty 1400

## 2021-08-07 MED ORDER — BENZONATATE 100 MG PO CAPS
100.0000 mg | ORAL_CAPSULE | Freq: Three times a day (TID) | ORAL | Status: DC | PRN
  Filled 2021-08-07: qty 1

## 2021-08-07 MED ORDER — OXYCODONE HCL 5 MG PO TABS
5.0000 mg | ORAL_TABLET | Freq: Four times a day (QID) | ORAL | Status: DC | PRN
  Administered 2021-08-07 – 2021-08-08 (×4): 5 mg via ORAL
  Filled 2021-08-07 (×4): qty 1

## 2021-08-07 MED ORDER — PERFLUTREN LIPID MICROSPHERE
1.0000 mL | INTRAVENOUS | Status: AC | PRN
  Administered 2021-08-07: 2 mL via INTRAVENOUS
  Filled 2021-08-07: qty 10

## 2021-08-07 MED ORDER — HEPARIN BOLUS VIA INFUSION
2500.0000 [IU] | Freq: Once | INTRAVENOUS | Status: AC
  Administered 2021-08-07: 2500 [IU] via INTRAVENOUS
  Filled 2021-08-07: qty 2500

## 2021-08-07 MED ORDER — LIP MEDEX EX OINT
TOPICAL_OINTMENT | CUTANEOUS | Status: DC | PRN
  Filled 2021-08-07: qty 7

## 2021-08-07 MED ORDER — FERROUS SULFATE 325 (65 FE) MG PO TABS
325.0000 mg | ORAL_TABLET | Freq: Three times a day (TID) | ORAL | Status: DC
  Administered 2021-08-07 – 2021-08-08 (×2): 325 mg via ORAL
  Filled 2021-08-07 (×2): qty 1

## 2021-08-07 NOTE — Plan of Care (Signed)
°  Problem: Education: Goal: Ability to verbalize understanding of medication therapies will improve Outcome: Progressing   Problem: Education: Goal: Knowledge of General Education information will improve Description: Including pain rating scale, medication(s)/side effects and non-pharmacologic comfort measures Outcome: Progressing   Problem: Activity: Goal: Risk for activity intolerance will decrease Outcome: Progressing   Problem: Nutrition: Goal: Adequate nutrition will be maintained Outcome: Progressing

## 2021-08-07 NOTE — Progress Notes (Addendum)
PROGRESS NOTE    Carlos DELIA  Mckinney:[id number] DOB: Oct 25, 1995 DOA: 08/06/2021 PCP: Pcp, No     Brief Narrative:  Patient is a 26 year old male with a past medical history significant for heart failure with a disinfection fraction of 15 to 20% and history of cocaine use presenting for right lower extremity swelling and pain.  Shortness of breath.  Further work-up showed an acute right DVT and CTA showed multiple pulmonary emboli.  He told another physician he has a family history of a clot in his father.  Additionally, there has been suggestion that has not been compliant with his heart failure medication regimen.  BNP 3697.9 in the ER.  He was started on IV heparin and twice daily Lasix.   New events last 24 hours / Subjective: Reports continued pain in his RLE, sob, cough. He also reports since returning from prison in 03/2021, he will lay around for up to 3 days at a time getting up only for food and bathroom breaks.  He denies any injury or family history of clots.  No recent travel or surgeries.  Assessment & Plan:   Principal Problem:   Multiple pulmonary emboli (HCC)  IV HPN, appreciate pharmacy input  Likely travelled from RLE DVT  Will transition to Xarelto tomorrow if he is able to tolerate orals  Albuterol neb prn if helpful  Benzonatate 100 mg TID prn  Active Problems:   Acute on chronic HFrEF (heart failure with reduced ejection fraction) (HCC)  IV Lasix 40 mg bid  Strict I/o's  Heart healthy diet  Entresto 24-26 mg bid  Spironolactone 25 mg/d  Digoxin 0.125 mg daily  Monitor wt    Acute deep vein thrombosis (DVT) of right lower extremity (HCC)  As above, seems provoked with his lifestyle  Will need PCP on DC, TOC team consulted    Iron deficiency anemia  FOBT and urine microscopy  Monitor CBC  Add ferrous sulfate TID  DVT prophylaxis: HPN tx for principle problem Code Status: Full Family Communication: Self Coming From: Home Disposition Plan: Home Barriers  to Discharge: medical improvement  Consultants:  none  Procedures:  none  Objective: Vitals:   08/07/21 0530 08/07/21 0700 08/07/21 0900 08/07/21 1209  BP: 92/64 103/71 103/69 102/79  Pulse: 98 98 (!) 110 93  Resp: Temp:    98.4 F (36.9 C)  TempSrc:    Oral  SpO2: 100% 95% 100% 100%  Weight:      Height:        Intake/Output Summary (Last 24 hours) at 08/07/2021 1315 Last data filed at 08/06/2021 2240 Gross per 24 hour  Intake 3 ml  Output --  Net 3 ml   Filed Weights   08/06/21 1239  Weight: 102.1 kg    Examination:  General exam: Appears calm and comfortable  Respiratory system: Clear to auscultation. Respiratory effort normal. No respiratory distress. No conversational dyspnea.  Cardiovascular system: S1 & S2 heard, RRR. No murmurs. +ankle edema on R Gastrointestinal system: Abdomen is nondistended, soft and nontender. Normal bowel sounds heard. Central nervous system: Alert and oriented. No focal neurological deficits. Speech clear.  MSK: TTP over medial R calf Skin: No rashes, lesions or ulcers on exposed skin  Psychiatry: Judgement and insight appear normal. Mood & affect appropriate.   Data Reviewed: I have personally reviewed following labs and imaging studies  CBC: Recent Labs  Lab 08/06/21 1248 08/07/21 0500  WBC 7.2 7.2  NEUTROABS 5.0  --  HGB 9.3* 8.6*  HCT 30.4* 27.8*  MCV 79.6* 77.9*  PLT 277 263   Basic Metabolic Panel: Recent Labs  Lab 08/06/21 1248 08/07/21 0500  NA 133* 132*  K 3.8 3.8  CL 100 101  CO2 24 22  GLUCOSE 120* 94  BUN 15 15  CREATININE 1.35* 1.20  CALCIUM 8.9 8.5*  MG  --  1.7   GFR: Estimated Creatinine Clearance: 113.5 mL/min (by C-G formula based on SCr of 1.2 mg/dL).  Anemia Panel: Recent Labs    08/07/21 0500  VITAMINB12 691  FOLATE 7.4  FERRITIN 21*  TIBC 406  IRON 36*   Recent Results (from the past 240 hour(s))  Resp Panel by RT-PCR (Flu A&B, Covid) Nasopharyngeal Swab     Status:  None   Collection Time: 08/06/21 11:47 PM   Specimen: Nasopharyngeal Swab; Nasopharyngeal(NP) swabs in vial transport medium  Result Value Ref Range Status   SARS Coronavirus 2 by RT PCR NEGATIVE NEGATIVE Final    Comment: (NOTE) SARS-CoV-2 target nucleic acids are NOT DETECTED.  The SARS-CoV-2 RNA is generally detectable in upper respiratory specimens during the acute phase of infection. The lowest concentration of SARS-CoV-2 viral copies this assay can detect is 138 copies/mL. A negative result does not preclude SARS-Cov-2 infection and should not be used as the sole basis for treatment or other patient management decisions. A negative result may occur with  improper specimen collection/handling, submission of specimen other than nasopharyngeal swab, presence of viral mutation(s) within the areas targeted by this assay, and inadequate number of viral copies(<138 copies/mL). A negative result must be combined with clinical observations, patient history, and epidemiological information. The expected result is Negative.  Fact Sheet for Patients:  BloggerCourse.com  Fact Sheet for Healthcare Providers:  SeriousBroker.it  This test is no t yet approved or cleared by the [place] FDA and  has been authorized for detection and/or diagnosis of SARS-CoV-2 by FDA under an Emergency Use Authorization (EUA). This EUA will remain  in effect (meaning this test can be used) for the duration of the COVID-19 declaration under Section 564(b)(1) of the Act, 21 U.S.C.section 360bbb-3(b)(1), unless the authorization is terminated  or revoked sooner.       Influenza A by PCR NEGATIVE NEGATIVE Final   Influenza B by PCR NEGATIVE NEGATIVE Final    Comment: (NOTE) The Xpert Xpress SARS-CoV-2/FLU/RSV plus assay is intended as an aid in the diagnosis of influenza from Nasopharyngeal swab specimens and should not be used as a sole basis for  treatment. Nasal washings and aspirates are unacceptable for Xpert Xpress SARS-CoV-2/FLU/RSV testing.  Fact Sheet for Patients: BloggerCourse.com  Fact Sheet for Healthcare Providers: SeriousBroker.it  This test is not yet approved or cleared by the [place] FDA and has been authorized for detection and/or diagnosis of SARS-CoV-2 by FDA under an Emergency Use Authorization (EUA). This EUA will remain in effect (meaning this test can be used) for the duration of the COVID-19 declaration under Section 564(b)(1) of the Act, 21 U.S.C. section 360bbb-3(b)(1), unless the authorization is terminated or revoked.  Performed at [facility], [address], [place], [place] [address]       Radiology Studies: CT Angio Chest PE W/Cm &/Or Wo Cm  Result Date: 08/06/2021 CLINICAL DATA:  Shortness of breath. History of heart failure and DVT. EXAM: CT ANGIOGRAPHY CHEST WITH CONTRAST TECHNIQUE: Multidetector CT imaging of the chest was performed using the standard protocol during bolus administration of intravenous contrast. Multiplanar  CT image reconstructions and MIPs were obtained to evaluate the vascular anatomy. CONTRAST:  44mL OMNIPAQUE IOHEXOL 350 MG/ML SOLN COMPARISON:  Chest x-ray dated July 19, 2021. FINDINGS: Cardiovascular: Satisfactory opacification of the pulmonary arteries to the segmental level. Small subsegmental pulmonary emboli in both upper lobes and the right lower lobe. Moderate cardiomegaly. No right heart strain. Small pericardial effusion. Reflux of contrast into the IVC and hepatic veins. No thoracic aortic aneurysm. Mediastinum/Nodes: No enlarged mediastinal, hilar, or axillary lymph nodes. Thyroid gland, trachea, and esophagus demonstrate no significant findings. Lungs/Pleura: Small peripheral consolidations/ground-glass densities in the lingula, anterior right upper lobe, posterior right middle lobe, and  anterolateral right lower lobe, suspicious for pulmonary infarcts. Trace right pleural effusion. No pneumothorax. Upper Abdomen: No acute abnormality. Small perihepatic and perisplenic ascites. Musculoskeletal: Bilateral gynecomastia. Anasarca. No acute or significant osseous findings. Review of the MIP images confirms the above findings. IMPRESSION: 1. Small subsegmental pulmonary emboli in both upper lobes and the right lower lobe with associated small pulmonary infarcts. 2. Trace right pleural effusion. 3. Moderate cardiomegaly with small pericardial effusion and reflux of contrast into the IVC and hepatic veins, consistent with right heart dysfunction. 4. Small upper abdominal ascites.  Anasarca. Critical Value/emergent results were called by telephone at the time of interpretation on 08/06/2021 at 5:31 pm to provider Mertha Baars, who verbally acknowledged these results. Electronically Signed   By: Obie Dredge M.D.   On: 08/06/2021 17:31   ECHOCARDIOGRAM COMPLETE  Result Date: 08/07/2021    ECHOCARDIOGRAM REPORT   Patient Name:   Carlos Mckinney Date of Exam: 08/07/2021 Medical Rec #:  [id number]       Height:       71.0 in Accession #:    [id number]      Weight:       225.0 lb Date of Birth:  04/08/1996        BSA:          2.217 m Patient Age:    25 years        BP:           92/64 mmHg Patient Gender: M               HR:           110 bpm. Exam Location:  Inpatient Procedure: 2D Echo, Cardiac Doppler, Color Doppler and Intracardiac            Opacification Agent Indications:    CHF  History:        Patient has no prior history of Echocardiogram examinations.                 CHF.  Sonographer:    Vanetta Shawl Referring Phys: [id number] VISHAL R PATEL IMPRESSIONS  1. Left ventricular ejection fraction, by estimation, is <20%. The left ventricle has severely decreased function. The left ventricle demonstrates global hypokinesis. The left ventricular internal cavity size was severely dilated. Left ventricular  diastolic parameters are consistent with Grade III diastolic dysfunction (restrictive). Elevated left atrial pressure.  2. Right ventricular systolic function is normal. The right ventricular size is moderately enlarged. There is moderately elevated pulmonary artery systolic pressure.  3. Left atrial size was moderately dilated.  4. Right atrial size was mildly dilated.  5. A small pericardial effusion is present.  6. The mitral valve is normal in structure. Trivial mitral valve regurgitation. No evidence of mitral stenosis.  7. Tricuspid valve regurgitation is moderate to severe.  8. The  aortic valve is tricuspid. Aortic valve regurgitation is not visualized. No aortic stenosis is present.  9. The inferior vena cava is dilated in size with <50% respiratory variability, suggesting right atrial pressure of 15 mmHg. Comparison(s): No prior Echocardiogram. FINDINGS  Left Ventricle: Left ventricular ejection fraction, by estimation, is <20%. The left ventricle has severely decreased function. The left ventricle demonstrates global hypokinesis. Definity contrast agent was given IV to delineate the left ventricular endocardial borders. The left ventricular internal cavity size was severely dilated. There is no left ventricular hypertrophy. Left ventricular diastolic parameters are consistent with Grade III diastolic dysfunction (restrictive). Elevated left atrial pressure. Right Ventricle: The right ventricular size is moderately enlarged. Right ventricular systolic function is normal. There is moderately elevated pulmonary artery systolic pressure. The tricuspid regurgitant velocity is 3.20 m/s, and with an assumed right atrial pressure of 15 mmHg, the estimated right ventricular systolic pressure is 56.0 mmHg. Left Atrium: Left atrial size was moderately dilated. Right Atrium: Right atrial size was mildly dilated. Pericardium: A small pericardial effusion is present. Mitral Valve: The mitral valve is normal in  structure. Trivial mitral valve regurgitation. No evidence of mitral valve stenosis. Tricuspid Valve: The tricuspid valve is normal in structure. Tricuspid valve regurgitation is moderate to severe. No evidence of tricuspid stenosis. Aortic Valve: The aortic valve is tricuspid. Aortic valve regurgitation is not visualized. No aortic stenosis is present. Aortic valve mean gradient measures 1.0 mmHg. Aortic valve peak gradient measures 2.5 mmHg. Aortic valve area, by VTI measures 2.16 cm. Pulmonic Valve: The pulmonic valve was normal in structure. Pulmonic valve regurgitation is mild. No evidence of pulmonic stenosis. Aorta: The aortic root is normal in size and structure. Venous: The inferior vena cava is dilated in size with less than 50% respiratory variability, suggesting right atrial pressure of 15 mmHg. IAS/Shunts: No atrial level shunt detected by color flow Doppler.  LEFT VENTRICLE PLAX 2D LVIDd:         7.10 cm      Diastology LVIDs:         6.60 cm      LV e' medial:    4.56 cm/s LV PW:         0.90 cm      LV E/e' medial:  19.7 LV IVS:        0.80 cm      LV e' lateral:   5.18 cm/s LVOT diam:     2.10 cm      LV E/e' lateral: 17.3 LV SV:         25 LV SV Index:   11 LVOT Area:     3.46 cm  LV Volumes (MOD) LV vol d, MOD A2C: 383.0 ml LV vol d, MOD A4C: 343.0 ml LV vol s, MOD A2C: 329.0 ml LV vol s, MOD A4C: 291.0 ml LV SV MOD A2C:     54.0 ml LV SV MOD A4C:     343.0 ml LV SV MOD BP:      50.9 ml RIGHT VENTRICLE            IVC RV Basal diam:  5.30 cm    IVC diam: 3.10 cm RV Mid diam:    3.30 cm RV S prime:     7.65 cm/s LEFT ATRIUM              Index        RIGHT ATRIUM           Index LA  diam:        4.80 cm  2.17 cm/m   RA Area:     26.70 cm LA Vol (A2C):   99.1 ml  44.70 ml/m  RA Volume:   80.90 ml  36.49 ml/m LA Vol (A4C):   94.3 ml  42.54 ml/m LA Biplane Vol: 102.0 ml 46.01 ml/m  AORTIC VALVE AV Area (Vmax):    2.51 cm AV Area (Vmean):   2.54 cm AV Area (VTI):     2.16 cm AV Vmax:            78.40 cm/s AV Vmean:          56.700 cm/s AV VTI:            0.118 m AV Peak Grad:      2.5 mmHg AV Mean Grad:      1.0 mmHg LVOT Vmax:         56.90 cm/s LVOT Vmean:        41.500 cm/s LVOT VTI:          0.074 m LVOT/AV VTI ratio: 0.62  AORTA Ao Root diam: 2.60 cm Ao Asc diam:  2.80 cm MITRAL VALVE               TRICUSPID VALVE MV Area (PHT): 6.65 cm    TR Peak grad:   41.0 mmHg MV Decel Time: 114 msec    TR Vmax:        320.00 cm/s MV E velocity: 89.70 cm/s MV A velocity: 31.50 cm/s  SHUNTS MV E/A ratio:  2.85        Systemic VTI:  0.07 m                            Systemic Diam: 2.10 cm Olga Millers MD Electronically signed by Olga Millers MD Signature Date/Time: 08/07/2021/10:36:02 AM    Final    VAS [place] LOWER EXTREMITY VENOUS (DVT) (ONLY [facility] & [facility])  Result Date: 08/06/2021  Lower Venous DVT Study Patient Name:  EARLE TROIANO  Date of Exam:   08/06/2021 Medical Rec #: [id number]        Accession #:    [id number] Date of Birth: [birth year]         Patient Gender: M Patient Age:   57 years Exam Location:  [facility] Procedure:      VAS [place] LOWER EXTREMITY VENOUS (DVT) Referring Phys: Cristal Deer GROCE --------------------------------------------------------------------------------  Indications: Bilateral swelling, new pain in right calf.  Limitations: Patient guarding, intolerant to compression at some segments. Comparison Study: No prior studies. Performing Technologist: Jean Rosenthal RDMS, RVT  Examination Guidelines: A complete evaluation includes B-mode imaging, spectral Doppler, color Doppler, and power Doppler as needed of all accessible portions of each vessel. Bilateral testing is considered an integral part of a complete examination. Limited examinations for reoccurring indications may be performed as noted. The reflux portion of the exam is performed with the patient in reverse Trendelenburg.  +---------+---------------+---------+-----------+----------+--------------+  RIGHT      Compressibility Phasicity Spontaneity Properties Thrombus Aging  +---------+---------------+---------+-----------+----------+--------------+  CFV                       Yes       Yes                                    +---------+---------------+---------+-----------+----------+--------------+  SFJ                       Yes       Yes                                    +---------+---------------+---------+-----------+----------+--------------+  FV Prox   Full                                                             +---------+---------------+---------+-----------+----------+--------------+  FV Mid    Full                                                             +---------+---------------+---------+-----------+----------+--------------+  FV Distal Full                                                             +---------+---------------+---------+-----------+----------+--------------+  PFV       Full                                                             +---------+---------------+---------+-----------+----------+--------------+  POP       Full            Yes       Yes                                    +---------+---------------+---------+-----------+----------+--------------+  PTV       Partial                                          Acute           +---------+---------------+---------+-----------+----------+--------------+  PERO      Full                                                             +---------+---------------+---------+-----------+----------+--------------+  Soleal    None            No        No                     Acute           +---------+---------------+---------+-----------+----------+--------------+  Gastroc   Full                                                             +---------+---------------+---------+-----------+----------+--------------+   +----+---------------+---------+-----------+----------+--------------+  LEFT Compressibility Phasicity Spontaneity Properties Thrombus Aging  +----+---------------+---------+-----------+----------+--------------+  CFV                  Yes       Yes                                    +----+---------------+---------+-----------+----------+--------------+    Summary: RIGHT: - Findings consistent with acute deep vein thrombosis involving the right posterior tibial veins, and right soleal veins. - No cystic structure found in the popliteal fossa.  LEFT: - No evidence of common femoral vein obstruction.  *See table(s) above for measurements and observations.    Preliminary      Scheduled Meds:  digoxin  0.125 mg Oral Daily   furosemide  40 mg Intravenous Q12H   sacubitril-valsartan  1 tablet Oral BID   sodium chloride flush  3 mL Intravenous Q12H   spironolactone  25 mg Oral Daily   Continuous Infusions:  heparin 2,200 Units/hr (08/07/21 1139)     LOS: 0 days    Time spent: 30 minutes   Sharlene Dory, DO [facility] 08/07/2021, 1:15 PM   Available via Epic secure chat 7am-7pm After these hours, please refer to coverage provider listed on [email or web address]

## 2021-08-07 NOTE — Progress Notes (Addendum)
ANTICOAGULATION CONSULT NOTE   Pharmacy Consult for heparin Indication: pulmonary embolus  No Known Allergies  Patient Measurements: Height: 5\' 11"  (180.3 cm) Weight: 102.1 kg (225 lb) IBW/kg (Calculated) : 75.3 Heparin Dosing Weight: 96 kg  Vital Signs: BP: 103/71 (01/08 0700) Pulse Rate: 98 (01/08 0700)  Labs: Recent Labs    08/06/21 1248 08/06/21 1629 08/06/21 1905 08/07/21 0100 08/07/21 0500  HGB 9.3*  --   --   --  8.6*  HCT 30.4*  --   --   --  27.8*  PLT 277  --   --   --  263  HEPARINUNFRC  --   --   --  <0.10*  --   CREATININE 1.35*  --   --   --  1.20  TROPONINIHS  --  14 14  --   --      Estimated Creatinine Clearance: 113.5 mL/min (by C-G formula based on SCr of 1.2 mg/dL).   Medical History: Past Medical History:  Diagnosis Date   Heart failure ([place])     Medications: Pt not taking anticoagulants PTA  Assessment: Pt is a 41 yoM with PMH significant for heart failure presenting to the ED with leg swelling. Venous doppler reveals acute DVT involving right posterior tibial veins and right soleal veins. CTA reveals small subsegmental PE in both upper lobes and the right lower lobe with associated small pulmonary infarcts.   Pharmacy consulted to dose heparin for acute PE/DVT.  Today, 08/07/21 HL = 0.26 is slightly subtherapeutic on heparin infusion of 2000 units/hr CBC: Hgb low and slightly decreased; Plt WNL & stable Confirmed with RN that heparin infusing at correct rate. No signs of bleeding.  Goal of Therapy:  Heparin level 0.3-0.7 units/ml Monitor platelets by anticoagulation protocol: Yes   Plan:  Heparin bolus of 1400 units IV once Increase heparin infusion to 2200 units/hr Check 6 hour HL CBC, HL daily while on heparin infusion Monitor for signs of bleeding  Lenis Noon, PharmD 08/07/2021,9:00 AM  Addendum - Evening Follow Up:  Assessment: HL = 0.24 is subtherapeutic and slightly decreased despite increasing infusion rate to 2200  units/hr Discussed with RN - patients initial IV site became dislodged on day shift. Unknown how long infusion was interrupted. New IV site obtained and confirmed heparin infusing at correct rate now.   Plan: Since heparin was interrupted for unknown duration, continue at current rate and check level in 6 hours  Lenis Noon, PharmD 08/07/21 9:57 PM

## 2021-08-07 NOTE — ED Notes (Signed)
Patient has refused to have fecal occult test collected.

## 2021-08-07 NOTE — Progress Notes (Signed)
[place] for heparin Indication: pulmonary embolus  No Known Allergies  Patient Measurements: Height: 5\' 11"  (180.3 cm) Weight: 102.1 kg (225 lb) IBW/kg (Calculated) : 75.3 Heparin Dosing Weight: 96 kg  Vital Signs: BP: 137/98 (01/08 0230) Pulse Rate: 94 (01/08 0230)  Labs: Recent Labs    08/06/21 1248 08/06/21 1629 08/06/21 1905 08/07/21 0100  HGB 9.3*  --   --   --   HCT 30.4*  --   --   --   PLT 277  --   --   --   HEPARINUNFRC  --   --   --  <0.10*  CREATININE 1.35*  --   --   --   TROPONINIHS  --  14 14  --      Estimated Creatinine Clearance: 100.9 mL/min (A) (by C-G formula based on SCr of 1.35 mg/dL (H)).   Medical History: Past Medical History:  Diagnosis Date   Heart failure ([place])     Medications: Pt not taking anticoagulants PTA  Assessment: Pt is a 67 yoM with PMH significant for heart failure presenting to the ED with leg swelling. Venous doppler reveals acute DVT involving right posterior tibial veins and right soleal veins. CTA reveals small subsegmental PE in both upper lobes and the right lower lobe with associated small pulmonary infarcts.   Pharmacy consulted to dose heparin for acute PE/DVT.  Today, 08/07/21 Heparin level < 0.1 with heparin gtt @ 1750 units/hr No bleeding complications noted and heparin infusing at correct rate per RN  Goal of Therapy:  Heparin level 0.3-0.7 units/ml Monitor platelets by anticoagulation protocol: Yes   Plan:  Rebolus heparin 2500 units IV x 1 Increase heparin gtt to 2000 units/hr Check heparin level 6 after rate increase Daily heparin level & CBC Monitor for signs of bleeding  Andrea Colglazier, Toribio Harbour, PharmD 08/07/2021,3:42 AM

## 2021-08-07 NOTE — Progress Notes (Signed)
Received in report from day RN that original IV that was running pt hep gtt was dislodged, unsure of of how long the interruption was.  Day RN removed bad IV and restarted hep gtt in left AC IV.  Hep level came back lower than previous level per pharmacy.  Verbal order to continue at current rate and recheck level around midnight/0100.  Put in IV consult to get a second IV for IV lasix and other meds.  Educated patient about the importance of calling RN should the IV hep pump beep as that indicates and interruptions in the infusion.  This RN moved heparin gtt from left AC to right FA, 1 for patient comfort and 2 to minimize interruptions in the infusion from patient bending arm

## 2021-08-08 ENCOUNTER — Telehealth (HOSPITAL_COMMUNITY): Payer: Self-pay

## 2021-08-08 ENCOUNTER — Other Ambulatory Visit (HOSPITAL_COMMUNITY): Payer: Self-pay

## 2021-08-08 DIAGNOSIS — D509 Iron deficiency anemia, unspecified: Principal | ICD-10-CM

## 2021-08-08 DIAGNOSIS — I824Y1 Acute embolism and thrombosis of unspecified deep veins of right proximal lower extremity: Secondary | ICD-10-CM

## 2021-08-08 DIAGNOSIS — I5023 Acute on chronic systolic (congestive) heart failure: Secondary | ICD-10-CM

## 2021-08-08 LAB — CBC
HCT: 27.9 % — ABNORMAL LOW (ref 39.0–52.0)
Hemoglobin: 8.7 g/dL — ABNORMAL LOW (ref 13.0–17.0)
MCH: 24.3 pg — ABNORMAL LOW (ref 26.0–34.0)
MCHC: 31.2 g/dL (ref 30.0–36.0)
MCV: 77.9 fL — ABNORMAL LOW (ref 80.0–100.0)
Platelets: 261 10*3/uL (ref 150–400)
RBC: 3.58 MIL/uL — ABNORMAL LOW (ref 4.22–5.81)
RDW: 17.4 % — ABNORMAL HIGH (ref 11.5–15.5)
WBC: 6.8 10*3/uL (ref 4.0–10.5)
nRBC: 0.4 % — ABNORMAL HIGH (ref 0.0–0.2)

## 2021-08-08 LAB — BASIC METABOLIC PANEL
Anion gap: 8 (ref 5–15)
BUN: 16 mg/dL (ref 6–20)
CO2: 22 mmol/L (ref 22–32)
Calcium: 8 mg/dL — ABNORMAL LOW (ref 8.9–10.3)
Chloride: 98 mmol/L (ref 98–111)
Creatinine, Ser: 1.24 mg/dL (ref 0.61–1.24)
GFR, Estimated: >60 mL/min (ref >60)
Glucose, Bld: 95 mg/dL (ref 70–99)
Potassium: 3.5 mmol/L (ref 3.5–5.1)
Sodium: 128 mmol/L — ABNORMAL LOW (ref 135–145)

## 2021-08-08 LAB — HEPARIN LEVEL (UNFRACTIONATED)
Heparin Unfractionated: 0.43 IU/mL (ref 0.30–0.70)
Heparin Unfractionated: 0.52 IU/mL (ref 0.30–0.70)

## 2021-08-08 MED ORDER — SENNOSIDES-DOCUSATE SODIUM 8.6-50 MG PO TABS
1.0000 | ORAL_TABLET | Freq: Every evening | ORAL | 0 refills | Status: DC | PRN
  Filled 2021-08-08: qty 100, 100d supply, fill #0

## 2021-08-08 MED ORDER — OXYCODONE HCL 5 MG PO TABS
5.0000 mg | ORAL_TABLET | Freq: Four times a day (QID) | ORAL | 0 refills | Status: DC | PRN
Start: 2021-08-08 — End: 2021-09-01
  Filled 2021-08-08: qty 10, 3d supply, fill #0

## 2021-08-08 MED ORDER — APIXABAN (ELIQUIS) VTE STARTER PACK (10MG AND 5MG)
ORAL_TABLET | ORAL | 2 refills | Status: DC
  Filled 2021-08-08: qty 74, 30d supply, fill #0

## 2021-08-08 MED ORDER — FERROUS SULFATE 325 (65 FE) MG PO TABS
325.0000 mg | ORAL_TABLET | Freq: Three times a day (TID) | ORAL | 3 refills | Status: DC
  Filled 2021-08-08: qty 100, 34d supply, fill #0

## 2021-08-08 NOTE — Progress Notes (Incomplete)
ADVANCED HF CLINIC NOTE  Primary Care: Pending Primary Cardiologist: Dr. Gala Romney  HPI: Carlos Mckinney is a 26 y.o.male with hx tobacco use, cocaine abuse, obesity.  He's had occasional ED visits over the years.  No routine care.   ED visit 11/2020 with cough, dyspnea, tachycardia/tachypnea.  Chest x-ray with bilateral mild to moderate perihilar and bibasilar infiltrates.  Some suspicion for COVID, pt prescribed CAP coverage and left AMA before covid testing performed.    He was incarcerated from May - September 2022. Recently released from prison 04/25/2021.  Hospitalized at [facility] and transferred to [facility] 08/22 for acute systolic HF and was seen by Advanced Heart Failure team. Diuresed with IV lasix and transitioned to po furosemide 20 mg daily.  Started on GDMT with enstresto and spiro. Echo 08/22 with EF 15-20%, mildly dilated RV with normal RV function, mild MR, moderate TR, PAP 40-45 mmHg. Coronary angiogram not performed. 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 Reports heavy cocaine use for a period of time before incarcerated. Denies any recent drug or alcohol use.  Established care 05/02/21, mild volume overload and NYHA II-III. Lasix increased.  Seen in ED 05/24/21 for CP and swelling, left without being seen.  Today he returns for HF follow up. He has been out of lasix for 1 week and taking Entresto once daily. He has tried to work but gets too tired. He is SOB walking on flat ground. Fatigued during the day. Denies CP, dizziness, edema, or PND/Orthopnea. Appetite ok. No fever or chills. He does not weigh at home. He says he has health insurance starting in January. He is currently staying with his grandmother. Smoking 2 cigarettes/ day. Has 4 children.  SH: Homeless. Staying with grandmother presently. Unable to work, applying for disability.  No cocaine use since May 2022.  Past medical history as above.  Current Outpatient Medications  Medication Sig Dispense Refill    albuterol (VENTOLIN HFA) 108 (90 Base) MCG/ACT inhaler Inhale 1-2 puffs into the lungs every 6 (six) hours as needed for wheezing or shortness of breath.     APIXABAN (ELIQUIS) VTE STARTER PACK (10MG  AND 5MG ) Take as directed on package: start with two-5mg  tablets twice daily for 7 days. On day 8, switch to one-5mg  tablet twice daily. 74 each 2   digoxin (LANOXIN) 0.125 MG tablet Take 1 tablet (0.125 mg total) by mouth daily. (Patient not taking: Reported on 08/06/2021) 30 tablet 4   ferrous sulfate 325 (65 FE) MG tablet Take 1 tablet (325 mg total) by mouth 3 (three) times daily with meals. 100 tablet 3   Fluticasone Propionate HFA (FLOVENT HFA IN) Inhale 1 puff into the lungs 2 (two) times daily as needed (for flares).     furosemide (LASIX) 40 MG tablet Take 1 tablet (40 mg total) by mouth daily. (Patient taking differently: Take 40 mg by mouth 2 (two) times daily.) 30 tablet 3   oxyCODONE (OXY IR/ROXICODONE) 5 MG immediate release tablet Take 1 tablet (5 mg total) by mouth every 6 (six) hours as needed for moderate pain. 10 tablet 0   polyethylene glycol (MIRALAX) 17 g packet Take 17 g by mouth daily. (Patient not taking: Reported on 08/06/2021) 28 each 0   sacubitril-valsartan (ENTRESTO) 24-26 MG Take 1 tablet by mouth 2 (two) times daily. 60 tablet 3   senna-docusate (SENOKOT-S) 8.6-50 MG tablet Take 1 tablet by mouth at bedtime as needed for mild constipation or moderate constipation. 100 tablet 0   spironolactone (ALDACTONE) 25 MG  tablet Take 1 tablet (25 mg total) by mouth daily. (Patient not taking: Reported on 08/06/2021) 30 tablet 11   No current facility-administered medications for this visit.   Facility-Administered Medications Ordered in Other Visits  Medication Dose Route Frequency Provider Last Rate Last Admin   acetaminophen (TYLENOL) tablet 650 mg  650 mg Oral Q6H PRN Charlsie Quest, MD       Or   acetaminophen (TYLENOL) suppository 650 mg  650 mg Rectal Q6H PRN Charlsie Quest, MD        albuterol (PROVENTIL) (2.5 MG/3ML) 0.083% nebulizer solution 2.5 mg  2.5 mg Inhalation Q6H PRN Charlsie Quest, MD       benzonatate (TESSALON) capsule 100 mg  100 mg Oral TID PRN Sharlene Dory, DO       digoxin (LANOXIN) tablet 0.125 mg  0.125 mg Oral Daily Darreld Mclean R, MD   0.125 mg at 08/08/21 1051   ferrous sulfate tablet 325 mg  325 mg Oral TID WC Sharlene Dory, DO   325 mg at 08/08/21 0900   furosemide (LASIX) injection 40 mg  40 mg Intravenous Q12H Darreld Mclean R, MD   40 mg at 08/08/21 4098   heparin ADULT infusion 100 units/mL (25000 units/277mL)  2,200 Units/hr Intravenous Continuous Cindi Carbon, [facility]   Stopped at 08/08/21 1251   lip balm (CARMEX) ointment   Topical PRN Sharlene Dory, DO       ondansetron [facility]) tablet 4 mg  4 mg Oral Q6H PRN Charlsie Quest, MD   4 mg at 08/07/21 1824   Or   ondansetron (ZOFRAN) injection 4 mg  4 mg Intravenous Q6H PRN Charlsie Quest, MD       oxyCODONE (Oxy IR/ROXICODONE) immediate release tablet 5 mg  5 mg Oral Q6H PRN Sharlene Dory, DO   5 mg at 08/08/21 1238   sacubitril-valsartan (ENTRESTO) 24-26 mg per tablet  1 tablet Oral BID Charlsie Quest, MD   1 tablet at 08/08/21 1051   senna-docusate (Senokot-S) tablet 1 tablet  1 tablet Oral QHS PRN Darreld Mclean R, MD       sodium chloride flush (NS) 0.9 % injection 3 mL  3 mL Intravenous Q12H Darreld Mclean R, MD   3 mL at 08/08/21 1042   spironolactone (ALDACTONE) tablet 25 mg  25 mg Oral Daily Charlsie Quest, MD   25 mg at 08/08/21 1051   No Known Allergies  Social History   Socioeconomic History   Marital status: Single    Spouse name: Not on file   Number of children: Not on file   Years of education: Not on file   Highest education level: Not on file  Occupational History   Not on file  Tobacco Use   Smoking status: Former    Types: Cigarettes   Smokeless tobacco: Never  Substance and Sexual Activity   Alcohol use: No   Drug use:  No   Sexual activity: Not on file  Other Topics Concern   Not on file  Social History Narrative   Not on file   Social Determinants of Health   Financial Resource Strain: High Risk   Difficulty of Paying Living Expenses: Hard  Food Insecurity: No Food Insecurity   Worried About Running Out of Food in the Last Year: Never true   Ran Out of Food in the Last Year: Never true  Transportation Needs: No Transportation Needs   Lack of Transportation (  Medical): No   Lack of Transportation (Non-Medical): No  Physical Activity: Not on file  Stress: Not on file  Social Connections: Not on file  Intimate Partner Violence: Not on file   FH: His maternal cousin has systolic HF and father has heart issues but details not known.   There were no vitals taken for this visit.  Wt Readings from Last 3 Encounters:  08/07/21 101.7 kg  07/18/21 101.6 kg  06/07/21 104.8 kg   PHYSICAL EXAM: General:  NAD. No resp difficulty HEENT: Normal Neck: Supple. JVP ~8. Carotids 2+ bilat; no bruits. No lymphadenopathy or thryomegaly appreciated. Cor: PMI nondisplaced. Tachy rate & rhythm. No rubs, gallops or murmurs. Lungs: Clear Abdomen: Soft, nontender, nondistended. No hepatosplenomegaly. No bruits or masses. Good bowel sounds. Extremities: No cyanosis, clubbing, rash, edema Neuro: Alert & oriented x 3, cranial nerves grossly intact. Moves all 4 extremities w/o difficulty. Affect pleasant.  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 Admit for a/c HF 08/22.  - Echo (8/22): EF 15-20%, mildly dilated RV with normal RV function, mild MR, moderate TR, PAP 40-45 mmHg - Etiology not certain. ? cocaine use, viral myocarditis or LV noncompaction. Father has heart issues and maternal cousin has systolic HF. Ischemic not likely at his age. LVH on ECG and BP elevated, component of HTN?  - Worse NYHA III. Appears slightly volume up.  ReDS 46% but may be overestimated.  - Start digoxin 0.125 mg daily. - Re-start Lasix 40 mg daily. Refilled sent. - Continue spiro 25 mg daily. - Continue Entresto 24/26 mg bid. Stressed taking twice a day.  - Needs cMRI & sleep study, but no insurance. - Discussed daily weights. - If not improving will need R/LHC.  - BMET and BNP today; BMET & dig level 1 week.  2. Hx cocaine abuse: - Reports he has not used since May 2022. - Encouraged complete abstinence.  3. Elevated BP: - Improved today. - Monitor.   4. Snoring: - Sleep study once has insurance.  5. Tobacco Use: - Counseled on quitting.  6. SDOH: - Homeless. Staying with grandmother now. Unable to work due to symptoms.  - Meds through HF fund, Entresto through patient assistance. He has GoodRx card. - Given # for [employer]. - Given a scale today. - He has food stamps.  7. Medication Compliance: - He has lost some of his meds and ran out of lasix. Discussed importance of taking medications as prescribed. - If he is going to be with his grandmother for a longer period of time, consider paramedicine referral.  Follow up in 3 weeks with APP for medication (consider SGLT2i).  Prince Rome, FNP-BC 08/08/21

## 2021-08-08 NOTE — Discharge Summary (Signed)
Physician Discharge Summary  Carlos Mckinney [id number] DOB: Dec 11, 1995 DOA: 08/06/2021  PCP: Merryl Hacker, No  Admit date: 08/06/2021 Discharge date: 08/08/2021  Admitted From: Home  Discharge disposition: Home  Recommendations for Outpatient Follow-Up:   Follow up with your primary care provider in one week.  Check CBC, BMP, magnesium in the next visit Patient has been started on Eliquis for anticoagulation.  She has mild iron deficiency anemia but no overt bleeding noted.  We will need to monitor CBC closely as outpatient.  Discharge Diagnosis:   Principal Problem:   Multiple pulmonary emboli (HCC) Active Problems:   Acute on chronic HFrEF (heart failure with reduced ejection fraction) (HCC)   Acute deep vein thrombosis (DVT) of right lower extremity (HCC)   Anemia   Iron deficiency anemia   Discharge Condition: Improved.  Diet recommendation: Low sodium, heart healthy.  Carbohydrate-modified.    Wound care: None.  Code status: Full.   History of Present Illness:   Patient is a 26 year old male with past medical history of congestive heart failure with a last known ejection fraction of 15 to 20% with history of cocaine abuse presented to hospital with right lower extremity swelling and pain with shortness of breath.  Further work-up showed acute right-sided DVT and CT angiogram showed multiple pulmonary emboli.  BNP was also elevated in the ED and patient was compliant to her heart failure medication.  Patient was then started on IV heparin and Lasix and was admitted hospital.    Hospital Course:   Following conditions were addressed during hospitalization as listed below,  Multiple pulmonary emboli likely secondary to acute DVT of the right lower extremity with lower extremity pain..  Likely provoked DVT. LE provoked DVT secondary to sedentary lifestyle.  Patient was initially on heparin drip which has been changed to Eliquis at this time.  As tolerated.  Patient was advised  the need for being on anticoagulation and the risk associated with it.    To monitor hemoglobin as outpatient since the patient will be on Eliquis.   Acute on chronic HFrEF (heart failure with reduced ejection fraction)  Received IV Lasix during hospitalization.  1 kg weight loss documented.  Patient was advised a low-salt diet compliance with medication including Entresto, spironolactone digoxin.  Patient does have an appointment to follow-up with cardiology as outpatient on 08/09/2021.  BNP on presentation was 3697.  We will need to monitor BMP as outpatient.  Mild hyponatremia.  Likely secondary to diuresis.  Will need to follow-up as outpatient.                Iron deficiency anemia She does have history of intermittent bleeding from hemorrhoids.  She was advised to continue stool softeners on discharge and focus on regular bowel movements.  Hemoglobin of 8.7 with a low MCV at 77.  Denies any obvious blood loss.  UA without any RBC or hemoglobin.  Vitamin B12 was 691.  Iron low at 36 with low saturation and TIBC elevated.  Ferritin low at 21.  Patient will be given iron sulfate on discharge.  Disposition.  At this time, patient is stable for disposition with outpatient PCP in the Cardiology follow-up.  Medical Consultants:   None.  Procedures:    None Subjective:   Today, patient was seen and examined at bedside.  Feels better with breathing.  Denies any overt bleeding.  Denies any chest pain, dizziness, lightheadedness.  Complains of mild leg pain at the site of DVT.  No  shortness of breath or dizziness.  Discharge Exam:   Vitals:   08/08/21 0158 08/08/21 0648  BP: 120/68 118/73  Pulse: 76 87  Resp: 20 18  Temp: 98.1 F (36.7 C) 98.4 F (36.9 C)  SpO2: 100% 99%   Vitals:   08/07/21 1720 08/07/21 2147 08/08/21 0158 08/08/21 0648  BP: (!) 124/100 105/66 120/68 118/73  Pulse: 100 (!) 101 76 87  Resp: (!) 21 18 20 18   Temp: 97.7 F (36.5 C) 98.2 F (36.8 C) 98.1 F (36.7  C) 98.4 F (36.9 C)  TempSrc: Oral Oral Oral Oral  SpO2: 100% 100% 100% 99%  Weight:      Height:       Body mass index is 31.28 kg/m.  General: Alert awake, not in obvious distress HENT: pupils equally reacting to light,  No scleral pallor or icterus noted. Oral mucosa is moist.  Chest:  Clear breath sounds.  Diminished breath sounds bilaterally. No crackles or wheezes.  CVS: S1 &S2 heard. No murmur.  Regular rate and rhythm. Abdomen: Soft, nontender, nondistended.  Bowel sounds are heard.   Extremities: No cyanosis, clubbing or edema.  Peripheral pulses are palpable. Psych: Alert, awake and oriented, normal mood CNS:  No cranial nerve deficits.  Power equal in all extremities.   Skin: Warm and dry.  No rashes noted.  The results of significant diagnostics from this hospitalization (including imaging, microbiology, ancillary and laboratory) are listed below for reference.     Diagnostic Studies:   CT Angio Chest PE W/Cm &/Or Wo Cm  Result Date: 08/06/2021 CLINICAL DATA:  Shortness of breath. History of heart failure and DVT. EXAM: CT ANGIOGRAPHY CHEST WITH CONTRAST TECHNIQUE: Multidetector CT imaging of the chest was performed using the standard protocol during bolus administration of intravenous contrast. Multiplanar CT image reconstructions and MIPs were obtained to evaluate the vascular anatomy. CONTRAST:  97mL OMNIPAQUE IOHEXOL 350 MG/ML SOLN COMPARISON:  Chest x-ray dated July 19, 2021. FINDINGS: Cardiovascular: Satisfactory opacification of the pulmonary arteries to the segmental level. Small subsegmental pulmonary emboli in both upper lobes and the right lower lobe. Moderate cardiomegaly. No right heart strain. Small pericardial effusion. Reflux of contrast into the IVC and hepatic veins. No thoracic aortic aneurysm. Mediastinum/Nodes: No enlarged mediastinal, hilar, or axillary lymph nodes. Thyroid gland, trachea, and esophagus demonstrate no significant findings. Lungs/Pleura:  Small peripheral consolidations/ground-glass densities in the lingula, anterior right upper lobe, posterior right middle lobe, and anterolateral right lower lobe, suspicious for pulmonary infarcts. Trace right pleural effusion. No pneumothorax. Upper Abdomen: No acute abnormality. Small perihepatic and perisplenic ascites. Musculoskeletal: Bilateral gynecomastia. Anasarca. No acute or significant osseous findings. Review of the MIP images confirms the above findings. IMPRESSION: 1. Small subsegmental pulmonary emboli in both upper lobes and the right lower lobe with associated small pulmonary infarcts. 2. Trace right pleural effusion. 3. Moderate cardiomegaly with small pericardial effusion and reflux of contrast into the IVC and hepatic veins, consistent with right heart dysfunction. 4. Small upper abdominal ascites.  Anasarca. Critical Value/emergent results were called by telephone at the time of interpretation on 08/06/2021 at 5:31 pm to provider Theodis Blaze, who verbally acknowledged these results. Electronically Signed   By: Titus Dubin M.D.   On: 08/06/2021 17:31   ECHOCARDIOGRAM COMPLETE  Result Date: 08/07/2021    ECHOCARDIOGRAM REPORT   Patient Name:   TERESO PINK Date of Exam: 08/07/2021 Medical Rec #:  [id number]       Height:  71.0 in Accession #:    [id number]      Weight:       225.0 lb Date of Birth:  03/18/1996        BSA:          2.217 m Patient Age:    25 years        BP:           92/64 mmHg Patient Gender: M               HR:           110 bpm. Exam Location:  Inpatient Procedure: 2D Echo, Cardiac Doppler, Color Doppler and Intracardiac            Opacification Agent Indications:    CHF  History:        Patient has no prior history of Echocardiogram examinations.                 CHF.  Sonographer:    Glo Herring Referring Phys: [id number] [place]  1. Left ventricular ejection fraction, by estimation, is <20%. The left ventricle has severely decreased function. The  left ventricle demonstrates global hypokinesis. The left ventricular internal cavity size was severely dilated. Left ventricular diastolic parameters are consistent with Grade III diastolic dysfunction (restrictive). Elevated left atrial pressure.  2. Right ventricular systolic function is normal. The right ventricular size is moderately enlarged. There is moderately elevated pulmonary artery systolic pressure.  3. Left atrial size was moderately dilated.  4. Right atrial size was mildly dilated.  5. A small pericardial effusion is present.  6. The mitral valve is normal in structure. Trivial mitral valve regurgitation. No evidence of mitral stenosis.  7. Tricuspid valve regurgitation is moderate to severe.  8. The aortic valve is tricuspid. Aortic valve regurgitation is not visualized. No aortic stenosis is present.  9. The inferior vena cava is dilated in size with <50% respiratory variability, suggesting right atrial pressure of 15 mmHg. Comparison(s): No prior Echocardiogram. FINDINGS  Left Ventricle: Left ventricular ejection fraction, by estimation, is <20%. The left ventricle has severely decreased function. The left ventricle demonstrates global hypokinesis. Definity contrast agent was given IV to delineate the left ventricular endocardial borders. The left ventricular internal cavity size was severely dilated. There is no left ventricular hypertrophy. Left ventricular diastolic parameters are consistent with Grade III diastolic dysfunction (restrictive). Elevated left atrial pressure. Right Ventricle: The right ventricular size is moderately enlarged. Right ventricular systolic function is normal. There is moderately elevated pulmonary artery systolic pressure. The tricuspid regurgitant velocity is 3.20 m/s, and with an assumed right atrial pressure of 15 mmHg, the estimated right ventricular systolic pressure is [id number]. Left Atrium: Left atrial size was moderately dilated. Right Atrium: Right atrial  size was mildly dilated. Pericardium: A small pericardial effusion is present. Mitral Valve: The mitral valve is normal in structure. Trivial mitral valve regurgitation. No evidence of mitral valve stenosis. Tricuspid Valve: The tricuspid valve is normal in structure. Tricuspid valve regurgitation is moderate to severe. No evidence of tricuspid stenosis. Aortic Valve: The aortic valve is tricuspid. Aortic valve regurgitation is not visualized. No aortic stenosis is present. Aortic valve mean gradient measures 1.0 mmHg. Aortic valve peak gradient measures 2.5 mmHg. Aortic valve area, by VTI measures 2.16 cm. Pulmonic Valve: The pulmonic valve was normal in structure. Pulmonic valve regurgitation is mild. No evidence of pulmonic stenosis. Aorta: The aortic root is normal in size and structure. Venous:  The inferior vena cava is dilated in size with less than 50% respiratory variability, suggesting right atrial pressure of 15 mmHg. IAS/Shunts: No atrial level shunt detected by color flow Doppler.  LEFT VENTRICLE PLAX 2D LVIDd:         7.10 cm      Diastology LVIDs:         6.60 cm      LV e' medial:    4.56 cm/s LV PW:         0.90 cm      LV E/e' medial:  19.7 LV IVS:        0.80 cm      LV e' lateral:   5.18 cm/s LVOT diam:     2.10 cm      LV E/e' lateral: 17.3 LV SV:         25 LV SV Index:   11 LVOT Area:     3.46 cm  LV Volumes (MOD) LV vol d, MOD A2C: 383.0 ml LV vol d, MOD A4C: 343.0 ml LV vol s, MOD A2C: 329.0 ml LV vol s, MOD A4C: 291.0 ml LV SV MOD A2C:     54.0 ml LV SV MOD A4C:     343.0 ml LV SV MOD BP:      50.9 ml RIGHT VENTRICLE            IVC RV Basal diam:  5.30 cm    IVC diam: 3.10 cm RV Mid diam:    3.30 cm RV S prime:     7.65 cm/s LEFT ATRIUM              Index        RIGHT ATRIUM           Index LA diam:        4.80 cm  2.17 cm/m   RA Area:     26.70 cm LA Vol (A2C):   99.1 ml  44.70 ml/m  RA Volume:   80.90 ml  36.49 ml/m LA Vol (A4C):   94.3 ml  42.54 ml/m LA Biplane Vol: 102.0 ml 46.01  ml/m  AORTIC VALVE AV Area (Vmax):    2.51 cm AV Area (Vmean):   2.54 cm AV Area (VTI):     2.16 cm AV Vmax:           78.40 cm/s AV Vmean:          56.700 cm/s AV VTI:            0.118 m AV Peak Grad:      2.5 mmHg AV Mean Grad:      1.0 mmHg LVOT Vmax:         56.90 cm/s LVOT Vmean:        41.500 cm/s LVOT VTI:          0.074 m LVOT/AV VTI ratio: 0.62  AORTA Ao Root diam: 2.60 cm Ao Asc diam:  2.80 cm MITRAL VALVE               TRICUSPID VALVE MV Area (PHT): 6.65 cm    TR Peak grad:   41.0 mmHg MV Decel Time: 114 msec    TR Vmax:        320.00 cm/s MV E velocity: 89.70 cm/s MV A velocity: 31.50 cm/s  SHUNTS MV E/A ratio:  2.85        Systemic VTI:  0.07 m  Systemic Diam: 2.10 cm Kirk Ruths MD Electronically signed by Kirk Ruths MD Signature Date/Time: 08/07/2021/10:36:02 AM    Final    VAS [place] LOWER EXTREMITY VENOUS (DVT) (ONLY [facility] & [facility])  Result Date: 08/06/2021  Lower Venous DVT Study Patient Name:  EATHEN TITMUS  Date of Exam:   08/06/2021 Medical Rec #: [id number]        Accession #:    [id number] Date of Birth: 08-11-1995         Patient Gender: M Patient Age:   10 years Exam Location:  [facility] Procedure:      VAS [place] LOWER EXTREMITY VENOUS (DVT) Referring Phys: Genevive Bi --------------------------------------------------------------------------------  Indications: Bilateral swelling, new pain in right calf.  Limitations: Patient guarding, intolerant to compression at some segments. Comparison Study: No prior studies. Performing Technologist: Darlin Coco RDMS, RVT  Examination Guidelines: A complete evaluation includes B-mode imaging, spectral Doppler, color Doppler, and power Doppler as needed of all accessible portions of each vessel. Bilateral testing is considered an integral part of a complete examination. Limited examinations for reoccurring indications may be performed as noted. The reflux portion of the exam is performed with the patient in reverse  Trendelenburg.  +---------+---------------+---------+-----------+----------+--------------+  RIGHT     Compressibility Phasicity Spontaneity Properties Thrombus Aging  +---------+---------------+---------+-----------+----------+--------------+  CFV                       Yes       Yes                                    +---------+---------------+---------+-----------+----------+--------------+  SFJ                       Yes       Yes                                    +---------+---------------+---------+-----------+----------+--------------+  FV Prox   Full                                                             +---------+---------------+---------+-----------+----------+--------------+  FV Mid    Full                                                             +---------+---------------+---------+-----------+----------+--------------+  FV Distal Full                                                             +---------+---------------+---------+-----------+----------+--------------+  PFV       Full                                                             +---------+---------------+---------+-----------+----------+--------------+  POP       Full            Yes       Yes                                    +---------+---------------+---------+-----------+----------+--------------+  PTV       Partial                                          Acute           +---------+---------------+---------+-----------+----------+--------------+  PERO      Full                                                             +---------+---------------+---------+-----------+----------+--------------+  Soleal    None            No        No                     Acute           +---------+---------------+---------+-----------+----------+--------------+  Gastroc   Full                                                             +---------+---------------+---------+-----------+----------+--------------+    +----+---------------+---------+-----------+----------+--------------+  LEFT Compressibility Phasicity Spontaneity Properties Thrombus Aging  +----+---------------+---------+-----------+----------+--------------+  CFV                  Yes       Yes                                    +----+---------------+---------+-----------+----------+--------------+    Summary: RIGHT: - Findings consistent with acute deep vein thrombosis involving the right posterior tibial veins, and right soleal veins. - No cystic structure found in the popliteal fossa.  LEFT: - No evidence of common femoral vein obstruction.  *See table(s) above for measurements and observations.    Preliminary      Labs:   Basic Metabolic Panel: Recent Labs  Lab 08/06/21 1248 08/07/21 0500 08/08/21 0056  NA 133* 132* 128*  K 3.8 3.8 3.5  CL 100 101 98  CO2 24 22 22   GLUCOSE 120* 94 95  BUN 15 15 16   CREATININE 1.35* 1.20 1.24  CALCIUM 8.9 8.5* 8.0*  MG  --  1.7  --    GFR Estimated Creatinine Clearance: 109.7 mL/min (by C-G formula based on SCr of 1.24 mg/dL). Liver Function Tests: No results for input(s): AST, ALT, ALKPHOS, BILITOT, PROT, ALBUMIN in the last 168 hours. No results for input(s): LIPASE, AMYLASE in the last 168 hours. No results for input(s): AMMONIA in the last 168 hours. Coagulation profile No results for input(s): INR, PROTIME in the last 168 hours.  CBC: Recent Labs  Lab 08/06/21 1248 08/07/21 0500 08/08/21 0056  WBC 7.2 7.2 6.8  NEUTROABS 5.0  --   --   HGB 9.3* 8.6* 8.7*  HCT 30.4* 27.8* 27.9*  MCV 79.6* 77.9* 77.9*  PLT 277 263 261   Cardiac Enzymes: No results for input(s): CKTOTAL, CKMB, CKMBINDEX, TROPONINI in the last 168 hours. BNP: Invalid input(s): POCBNP CBG: No results for input(s): GLUCAP in the last 168 hours. D-Dimer No results for input(s): DDIMER in the last 72 hours. Hgb A1c No results for input(s): HGBA1C in the last 72 hours. Lipid Profile No results for input(s):  CHOL, HDL, LDLCALC, TRIG, CHOLHDL, LDLDIRECT in the last 72 hours. Thyroid function studies No results for input(s): TSH, T4TOTAL, T3FREE, THYROIDAB in the last 72 hours.  Invalid input(s): FREET3 Anemia work up Recent Labs    08/07/21 0500  VITAMINB12 691  FOLATE 7.4  FERRITIN 21*  TIBC 406  IRON 36*   Microbiology Recent Results (from the past 240 hour(s))  Resp Panel by RT-PCR (Flu A&B, Covid) Nasopharyngeal Swab     Status: None   Collection Time: 08/06/21 11:47 PM   Specimen: Nasopharyngeal Swab; Nasopharyngeal(NP) swabs in vial transport medium  Result Value Ref Range Status   SARS Coronavirus 2 by RT PCR NEGATIVE NEGATIVE Final    Comment: (NOTE) SARS-CoV-2 target nucleic acids are NOT DETECTED.  The SARS-CoV-2 RNA is generally detectable in upper respiratory specimens during the acute phase of infection. The lowest concentration of SARS-CoV-2 viral copies this assay can detect is 138 copies/mL. A negative result does not preclude SARS-Cov-2 infection and should not be used as the sole basis for treatment or other patient management decisions. A negative result may occur with  improper specimen collection/handling, submission of specimen other than nasopharyngeal swab, presence of viral mutation(s) within the areas targeted by this assay, and inadequate number of viral copies(<138 copies/mL). A negative result must be combined with clinical observations, patient history, and epidemiological information. The expected result is Negative.  Fact Sheet for Patients:  EntrepreneurPulse.com.au  Fact Sheet for Healthcare Providers:  IncredibleEmployment.be  This test is no t yet approved or cleared by the [place] FDA and  has been authorized for detection and/or diagnosis of SARS-CoV-2 by FDA under an Emergency Use Authorization (EUA). This EUA will remain  in effect (meaning this test can be used) for the duration of  the COVID-19 declaration under Section 564(b)(1) of the Act, 21 U.S.C.section 360bbb-3(b)(1), unless the authorization is terminated  or revoked sooner.       Influenza A by PCR NEGATIVE NEGATIVE Final   Influenza B by PCR NEGATIVE NEGATIVE Final    Comment: (NOTE) The Xpert Xpress SARS-CoV-2/FLU/RSV plus assay is intended as an aid in the diagnosis of influenza from Nasopharyngeal swab specimens and should not be used as a sole basis for treatment. Nasal washings and aspirates are unacceptable for Xpert Xpress SARS-CoV-2/FLU/RSV testing.  Fact Sheet for Patients: EntrepreneurPulse.com.au  Fact Sheet for Healthcare Providers: IncredibleEmployment.be  This test is not yet approved or cleared by the [place] FDA and has been authorized for detection and/or diagnosis of SARS-CoV-2 by FDA under an Emergency Use Authorization (EUA). This EUA will remain in effect (meaning this test can be used) for the duration of the COVID-19 declaration under Section 564(b)(1) of the Act, 21 U.S.C. section 360bbb-3(b)(1), unless the authorization is terminated or revoked.  Performed at [facility], [place] [address] [place], [place] [address]      Discharge Instructions:   Discharge Instructions     (  HEART FAILURE PATIENTS) Call MD:  Anytime you have any of the following symptoms: 1) 3 pound weight gain in 24 hours or 5 pounds in 1 week 2) shortness of breath, with or without a dry hacking cough 3) swelling in the hands, feet or stomach 4) if you have to sleep on extra pillows at night in order to breathe.   Complete by: As directed    Diet - low sodium heart healthy   Complete by: As directed    Discharge instructions   Complete by: As directed    Follow up with your primary care physician in one week. Follow up with your cardiology as scheduled tomorrow. No overexertion. Continue to take blood thinners as prescribed. Take stool  softeners to keep your bowels moving everyday. Seek medical attention for worsening symptoms.   Heart Failure patients record your daily weight using the same scale at the same time of day   Complete by: As directed    Increase activity slowly   Complete by: As directed    STOP any activity that causes chest pain, shortness of breath, dizziness, sweating, or exessive weakness   Complete by: As directed       Allergies as of 08/08/2021   No Known Allergies      Medication List     TAKE these medications    albuterol 108 (90 Base) MCG/ACT inhaler Commonly known as: VENTOLIN HFA Inhale 1-2 puffs into the lungs every 6 (six) hours as needed for wheezing or shortness of breath.   Apixaban Starter Pack (10mg  and 5mg ) Commonly known as: ELIQUIS STARTER PACK Take as directed on package: start with two-5mg  tablets twice daily for 7 days. On day 8, switch to one-5mg  tablet twice daily.   digoxin 0.125 MG tablet Commonly known as: Lanoxin Take 1 tablet (0.125 mg total) by mouth daily.   Entresto 24-26 MG Generic drug: sacubitril-valsartan Take 1 tablet by mouth 2 (two) times daily.   ferrous sulfate 325 (65 FE) MG tablet Take 1 tablet (325 mg total) by mouth 3 (three) times daily with meals.   FLOVENT HFA IN Inhale 1 puff into the lungs 2 (two) times daily as needed (for flares).   furosemide 40 MG tablet Commonly known as: LASIX Take 1 tablet (40 mg total) by mouth daily. What changed: when to take this   oxyCODONE 5 MG immediate release tablet Commonly known as: Oxy IR/ROXICODONE Take 1 tablet (5 mg total) by mouth every 6 (six) hours as needed for moderate pain.   polyethylene glycol 17 g packet Commonly known as: MiraLax Take 17 g by mouth daily.   senna-docusate 8.6-50 MG tablet Commonly known as: Senokot-S Take 1 tablet by mouth at bedtime as needed for mild constipation or moderate constipation.   spironolactone 25 MG tablet Commonly known as: ALDACTONE Take 1  tablet (25 mg total) by mouth daily.         Time coordinating discharge: 39 minutes  Signed:  Cristiano Capri  [facility] 08/08/2021, 8:58 AM

## 2021-08-08 NOTE — Telephone Encounter (Signed)
Called and left patient a detailed message to confirm/remind patient of their appointment at the [facility] on 08/09/21.

## 2021-08-08 NOTE — Discharge Instructions (Signed)
Information on my medicine - ELIQUIS (apixaban)  This medication education was reviewed with me or my healthcare representative as part of my discharge preparation.    Why was Eliquis prescribed for you? Eliquis was prescribed to treat blood clots that may have been found in the veins of your legs (deep vein thrombosis) or in your lungs (pulmonary embolism) and to reduce the risk of them occurring again.  What do You need to know about Eliquis ? The starting dose is 10 mg (two 5 mg tablets) taken TWICE daily for the FIRST SEVEN (7) DAYS, then on 08/15/21  the dose is reduced to ONE 5 mg tablet taken TWICE daily.  Eliquis may be taken with or without food.   Try to take the dose about the same time in the morning and in the evening. If you have difficulty swallowing the tablet whole please discuss with your pharmacist how to take the medication safely.  Take Eliquis exactly as prescribed and DO NOT stop taking Eliquis without talking to the doctor who prescribed the medication.  Stopping may increase your risk of developing a new blood clot.  Refill your prescription before you run out.  After discharge, you should have regular check-up appointments with your healthcare provider that is prescribing your Eliquis.    What do you do if you miss a dose? If a dose of ELIQUIS is not taken at the scheduled time, take it as soon as possible on the same day and twice-daily administration should be resumed. The dose should not be doubled to make up for a missed dose.  Important Safety Information A possible side effect of Eliquis is bleeding. You should call your healthcare provider right away if you experience any of the following: Bleeding from an injury or your nose that does not stop. Unusual colored urine (red or dark brown) or unusual colored stools (red or black). Unusual bruising for unknown reasons. A serious fall or if you hit your head (even if there is no bleeding).  Some  medicines may interact with Eliquis and might increase your risk of bleeding or clotting while on Eliquis. To help avoid this, consult your healthcare provider or pharmacist prior to using any new prescription or non-prescription medications, including herbals, vitamins, non-steroidal anti-inflammatory drugs (NSAIDs) and supplements.  This website has more information on Eliquis (apixaban): http://www.eliquis.com/eliquis/home

## 2021-08-08 NOTE — Progress Notes (Signed)
ANTICOAGULATION CONSULT NOTE   Pharmacy Consult for heparin Indication: acute pulmonary embolus and DVT  No Known Allergies  Patient Measurements: Height: 5\' 11"  (180.3 cm) Weight: 101.7 kg (224 lb 4.8 oz) IBW/kg (Calculated) : 75.3 Heparin Dosing Weight: 96 kg  Vital Signs: Temp: 98.4 F (36.9 C) (01/09 0648) Temp Source: Oral (01/09 0648) BP: 118/73 (01/09 0648) Pulse Rate: 87 (01/09 0648)  Labs: Recent Labs    08/06/21 1248 08/06/21 1629 08/06/21 1905 08/07/21 0100 08/07/21 0500 08/07/21 1022 08/07/21 1815 08/08/21 0056  HGB 9.3*  --   --   --  8.6*  --   --  8.7*  HCT 30.4*  --   --   --  27.8*  --   --  27.9*  PLT 277  --   --   --  263  --   --  261  HEPARINUNFRC  --   --   --    < >  --  0.26* 0.24* 0.43  CREATININE 1.35*  --   --   --  1.20  --   --  1.24  TROPONINIHS  --  14 14  --   --   --   --   --    < > = values in this interval not displayed.     Estimated Creatinine Clearance: 109.7 mL/min (by C-G formula based on SCr of 1.24 mg/dL).   Medical History: Past Medical History:  Diagnosis Date   Heart failure (HCC)     Assessment: Pt is a 30 yoM with PMH significant for heart failure presenting to the ED with leg swelling. Venous doppler reveals acute DVT involving right posterior tibial veins and right soleal veins. CTA reveals small subsegmental PE in both upper lobes and the right lower lobe with associated small pulmonary infarcts.   Pharmacy consulted to dose heparin for acute PE/DVT.   Today, 08/08/2021: - confirmatory heparin level collected at 8a is therapeutic at 0.52 - hgb low but stable. Plts wnl - no bleeding documented  Goal of Therapy:  Heparin level 0.3-0.7 units/ml Monitor platelets by anticoagulation protocol: Yes   Plan:  - Continue heparin infusion @ 2200 units/hr - CBC, HL daily while on heparin infusion - Monitor for signs of bleeding - Follow for transition to oral DOAC therapy  10/06/2021, PharmD 08/08/2021,8:18  AM

## 2021-08-08 NOTE — Progress Notes (Signed)
Discharge instructions and AVS reviewed w/ pt. And partner at bedside. Patient and partner verbalized understanding and had no further questions.

## 2021-08-08 NOTE — Plan of Care (Signed)
°  Problem: Education: °Goal: Ability to demonstrate management of disease process will improve °Outcome: Progressing °Goal: Ability to verbalize understanding of medication therapies will improve °Outcome: Progressing °  °Problem: Cardiac: °Goal: Ability to achieve and maintain adequate cardiopulmonary perfusion will improve °Outcome: Progressing °  °Problem: Education: °Goal: Knowledge of General Education information will improve °Description: Including pain rating scale, medication(s)/side effects and non-pharmacologic comfort measures °Outcome: Progressing °  °

## 2021-08-08 NOTE — Progress Notes (Signed)
ANTICOAGULATION CONSULT NOTE   Pharmacy Consult for heparin Indication: pulmonary embolus  No Known Allergies  Patient Measurements: Height: 5\' 11"  (180.3 cm) Weight: 101.7 kg (224 lb 4.8 oz) IBW/kg (Calculated) : 75.3 Heparin Dosing Weight: 96 kg  Vital Signs: Temp: 98.1 F (36.7 C) (01/09 0158) Temp Source: Oral (01/09 0158) BP: 120/68 (01/09 0158) Pulse Rate: 76 (01/09 0158)  Labs: Recent Labs    08/06/21 1248 08/06/21 1629 08/06/21 1905 08/07/21 0100 08/07/21 0500 08/07/21 1022 08/07/21 1815 08/08/21 0056  HGB 9.3*  --   --   --  8.6*  --   --  8.7*  HCT 30.4*  --   --   --  27.8*  --   --  27.9*  PLT 277  --   --   --  263  --   --  261  HEPARINUNFRC  --   --   --    < >  --  0.26* 0.24* 0.43  CREATININE 1.35*  --   --   --  1.20  --   --   --   TROPONINIHS  --  14 14  --   --   --   --   --    < > = values in this interval not displayed.     Estimated Creatinine Clearance: 113.3 mL/min (by C-G formula based on SCr of 1.2 mg/dL).   Medical History: Past Medical History:  Diagnosis Date   Heart failure (HCC)     Medications: Pt not taking anticoagulants PTA  Assessment: Pt is a 53 yoM with PMH significant for heart failure presenting to the ED with leg swelling. Venous doppler reveals acute DVT involving right posterior tibial veins and right soleal veins. CTA reveals small subsegmental PE in both upper lobes and the right lower lobe with associated small pulmonary infarcts.   Pharmacy consulted to dose heparin for acute PE/DVT.  Today, 08/08/21 HL = 0.43 therapeutic on heparin infusion of 2200 units/hr CBC: Hgb low (8.7)  Plt WNL  No complications of therapy noted  Goal of Therapy:  Heparin level 0.3-0.7 units/ml Monitor platelets by anticoagulation protocol: Yes   Plan:  Continue heparin infusion @ 2200 units/hr Recheck 6 hour HL to confirm therapeutic dose CBC, HL daily while on heparin infusion Monitor for signs of bleeding Follow for  transition to oral DOAC therapy  Zaria Taha, 10/06/21, PharmD 08/08/2021,2:15 AM

## 2021-08-08 NOTE — TOC Transition Note (Signed)
Transition of Care [facility]) - CM/SW Discharge Note   Patient Details  Name: Carlos Mckinney MRN: [place]:[id number] Date of Birth: 08/17/1995  [name] CM/SW Contact:  Dessa Phi, RN Phone Number: 08/08/2021, 11:55 AM   Clinical Narrative:d/c plan home. Referral for med asst-hx cocaine use;patient already goes to [facility] ot[facility] pharmacy for meds;new meds(xarelto-pharmacy to asst,there meds are OTC, & narcotic-unable to use med asst) No further CM needs.       Final next level of care: Home/Self Care Barriers to Discharge: No Barriers Identified   Patient Goals and CMS Choice        Discharge Placement                       Discharge Plan and Services                                     Social Determinants of Health (SDOH) Interventions     Readmission Risk Interventions No flowsheet data found.

## 2021-08-09 ENCOUNTER — Encounter (HOSPITAL_COMMUNITY): Payer: Self-pay

## 2021-08-25 ENCOUNTER — Other Ambulatory Visit (HOSPITAL_COMMUNITY): Payer: Self-pay

## 2021-08-31 ENCOUNTER — Telehealth (HOSPITAL_COMMUNITY): Payer: Self-pay | Admitting: Cardiology

## 2021-08-31 NOTE — Telephone Encounter (Signed)
Patient called to report increased weight Normal weight 225, weight today 240 Increased SOB, +BLE edema   Advised a sliding scale diuretic is normally ok ( take double for a day or two) HOWEVER with this much weight gain and swelling, unsure how effective it would be and labs are needed.   Add on appt available 2/2 @ 1030. Will forward to provider for med changes if needed until appt.

## 2021-08-31 NOTE — Telephone Encounter (Signed)
lmom 

## 2021-09-01 ENCOUNTER — Encounter (HOSPITAL_COMMUNITY): Payer: Self-pay

## 2021-09-01 ENCOUNTER — Other Ambulatory Visit (HOSPITAL_COMMUNITY): Payer: Medicaid Other

## 2021-09-01 ENCOUNTER — Other Ambulatory Visit: Payer: Self-pay

## 2021-09-01 ENCOUNTER — Inpatient Hospital Stay: Payer: Self-pay

## 2021-09-01 ENCOUNTER — Encounter (HOSPITAL_COMMUNITY): Payer: Self-pay | Admitting: Internal Medicine

## 2021-09-01 ENCOUNTER — Inpatient Hospital Stay (HOSPITAL_COMMUNITY)
Admission: AD | Admit: 2021-09-01 | Discharge: 2021-09-07 | DRG: 291 | Disposition: A | Discharge status: Home / Self Care | Payer: Medicaid Other | Source: Ambulatory Visit | Attending: Internal Medicine | Admitting: Internal Medicine

## 2021-09-01 ENCOUNTER — Ambulatory Visit (HOSPITAL_COMMUNITY)
Admission: RE | Admit: 2021-09-01 | Discharge: 2021-09-01 | Disposition: A | Discharge status: Home / Self Care | Payer: Medicaid Other | Source: Ambulatory Visit | Attending: Family Medicine | Admitting: Family Medicine

## 2021-09-01 VITALS — BP 84/62 | HR 123 | Wt 236.4 lb

## 2021-09-01 DIAGNOSIS — E876 Hypokalemia: Secondary | ICD-10-CM | POA: Diagnosis not present

## 2021-09-01 DIAGNOSIS — Z86718 Personal history of other venous thrombosis and embolism: Secondary | ICD-10-CM | POA: Diagnosis not present

## 2021-09-01 DIAGNOSIS — R0683 Snoring: Secondary | ICD-10-CM | POA: Diagnosis present

## 2021-09-01 DIAGNOSIS — Z8249 Family history of ischemic heart disease and other diseases of the circulatory system: Secondary | ICD-10-CM

## 2021-09-01 DIAGNOSIS — Z79899 Other long term (current) drug therapy: Secondary | ICD-10-CM | POA: Diagnosis not present

## 2021-09-01 DIAGNOSIS — Z7901 Long term (current) use of anticoagulants: Secondary | ICD-10-CM

## 2021-09-01 DIAGNOSIS — E669 Obesity, unspecified: Secondary | ICD-10-CM | POA: Diagnosis present

## 2021-09-01 DIAGNOSIS — E871 Hypo-osmolality and hyponatremia: Secondary | ICD-10-CM | POA: Diagnosis not present

## 2021-09-01 DIAGNOSIS — F1490 Cocaine use, unspecified, uncomplicated: Secondary | ICD-10-CM | POA: Diagnosis present

## 2021-09-01 DIAGNOSIS — Z6832 Body mass index (BMI) 32.0-32.9, adult: Secondary | ICD-10-CM

## 2021-09-01 DIAGNOSIS — R42 Dizziness and giddiness: Secondary | ICD-10-CM | POA: Diagnosis not present

## 2021-09-01 DIAGNOSIS — Z5900 Homelessness unspecified: Secondary | ICD-10-CM | POA: Insufficient documentation

## 2021-09-01 DIAGNOSIS — I428 Other cardiomyopathies: Secondary | ICD-10-CM | POA: Diagnosis present

## 2021-09-01 DIAGNOSIS — I509 Heart failure, unspecified: Secondary | ICD-10-CM

## 2021-09-01 DIAGNOSIS — I5033 Acute on chronic diastolic (congestive) heart failure: Secondary | ICD-10-CM | POA: Diagnosis not present

## 2021-09-01 DIAGNOSIS — D509 Iron deficiency anemia, unspecified: Secondary | ICD-10-CM | POA: Diagnosis not present

## 2021-09-01 DIAGNOSIS — I081 Rheumatic disorders of both mitral and tricuspid valves: Secondary | ICD-10-CM | POA: Diagnosis not present

## 2021-09-01 DIAGNOSIS — Z86711 Personal history of pulmonary embolism: Secondary | ICD-10-CM | POA: Diagnosis not present

## 2021-09-01 DIAGNOSIS — I5021 Acute systolic (congestive) heart failure: Secondary | ICD-10-CM | POA: Diagnosis not present

## 2021-09-01 DIAGNOSIS — F1090 Alcohol use, unspecified, uncomplicated: Secondary | ICD-10-CM | POA: Diagnosis not present

## 2021-09-01 DIAGNOSIS — I5023 Acute on chronic systolic (congestive) heart failure: Secondary | ICD-10-CM | POA: Diagnosis present

## 2021-09-01 DIAGNOSIS — I110 Hypertensive heart disease with heart failure: Secondary | ICD-10-CM | POA: Insufficient documentation

## 2021-09-01 DIAGNOSIS — I472 Ventricular tachycardia, unspecified: Secondary | ICD-10-CM | POA: Diagnosis not present

## 2021-09-01 DIAGNOSIS — Z87891 Personal history of nicotine dependence: Secondary | ICD-10-CM | POA: Diagnosis not present

## 2021-09-01 DIAGNOSIS — Z20822 Contact with and (suspected) exposure to covid-19: Secondary | ICD-10-CM | POA: Diagnosis not present

## 2021-09-01 DIAGNOSIS — K649 Unspecified hemorrhoids: Secondary | ICD-10-CM | POA: Diagnosis present

## 2021-09-01 DIAGNOSIS — J90 Pleural effusion, not elsewhere classified: Secondary | ICD-10-CM

## 2021-09-01 DIAGNOSIS — R570 Cardiogenic shock: Secondary | ICD-10-CM | POA: Diagnosis not present

## 2021-09-01 DIAGNOSIS — F1411 Cocaine abuse, in remission: Secondary | ICD-10-CM | POA: Diagnosis not present

## 2021-09-01 HISTORY — DX: Essential (primary) hypertension: I10

## 2021-09-01 HISTORY — DX: Other psychoactive substance abuse, uncomplicated: F19.10

## 2021-09-01 LAB — IRON AND TIBC
Iron: 26 ug/dL — ABNORMAL LOW (ref 45–182)
Saturation Ratios: 7 % — ABNORMAL LOW (ref 17.9–39.5)
TIBC: 391 ug/dL (ref 250–450)
UIBC: 365 ug/dL

## 2021-09-01 LAB — COMPREHENSIVE METABOLIC PANEL
ALT: 26 U/L (ref 0–44)
AST: 27 U/L (ref 15–41)
Albumin: 2.8 g/dL — ABNORMAL LOW (ref 3.5–5.0)
Alkaline Phosphatase: 69 U/L (ref 38–126)
Anion gap: 10 (ref 5–15)
BUN: 9 mg/dL (ref 6–20)
CO2: 22 mmol/L (ref 22–32)
Calcium: 8.6 mg/dL — ABNORMAL LOW (ref 8.9–10.3)
Chloride: 97 mmol/L — ABNORMAL LOW (ref 98–111)
Creatinine, Ser: 1.19 mg/dL (ref 0.61–1.24)
GFR, Estimated: >60 mL/min (ref >60)
Glucose, Bld: 104 mg/dL — ABNORMAL HIGH (ref 70–99)
Potassium: 3.9 mmol/L (ref 3.5–5.1)
Sodium: 129 mmol/L — ABNORMAL LOW (ref 135–145)
Total Bilirubin: 2.6 mg/dL — ABNORMAL HIGH (ref 0.3–1.2)
Total Protein: 6.2 g/dL — ABNORMAL LOW (ref 6.5–8.1)

## 2021-09-01 LAB — CBC
HCT: 26.8 % — ABNORMAL LOW (ref 39.0–52.0)
Hemoglobin: 8.5 g/dL — ABNORMAL LOW (ref 13.0–17.0)
MCH: 24.8 pg — ABNORMAL LOW (ref 26.0–34.0)
MCHC: 31.7 g/dL (ref 30.0–36.0)
MCV: 78.1 fL — ABNORMAL LOW (ref 80.0–100.0)
Platelets: 306 10*3/uL (ref 150–400)
RBC: 3.43 MIL/uL — ABNORMAL LOW (ref 4.22–5.81)
RDW: 21.6 % — ABNORMAL HIGH (ref 11.5–15.5)
WBC: 10.7 10*3/uL — ABNORMAL HIGH (ref 4.0–10.5)
nRBC: 0.6 % — ABNORMAL HIGH (ref 0.0–0.2)

## 2021-09-01 LAB — LACTIC ACID, PLASMA: Lactic Acid, Venous: 1.8 mmol/L (ref 0.5–1.9)

## 2021-09-01 LAB — RAPID URINE DRUG SCREEN, HOSP PERFORMED
Amphetamines: NOT DETECTED
Barbiturates: NOT DETECTED
Benzodiazepines: NOT DETECTED
Cocaine: POSITIVE — AB
Opiates: NOT DETECTED
Tetrahydrocannabinol: POSITIVE — AB

## 2021-09-01 LAB — DIGOXIN LEVEL: Digoxin Level: <0.2 ng/mL — ABNORMAL LOW (ref 0.8–2.0)

## 2021-09-01 LAB — COOXEMETRY PANEL
Carboxyhemoglobin: 1.4 % (ref 0.5–1.5)
Carboxyhemoglobin: 1.8 % — ABNORMAL HIGH (ref 0.5–1.5)
Methemoglobin: 1 % (ref 0.0–1.5)
Methemoglobin: 0.9 % (ref 0.0–1.5)
O2 Saturation: 23.9 %
O2 Saturation: 34 %
Total hemoglobin: 9 g/dL — ABNORMAL LOW (ref 12.0–16.0)
Total hemoglobin: 7.7 g/dL — ABNORMAL LOW (ref 12.0–16.0)

## 2021-09-01 LAB — MAGNESIUM: Magnesium: 1.8 mg/dL (ref 1.7–2.4)

## 2021-09-01 LAB — APTT: aPTT: 56 seconds — ABNORMAL HIGH (ref 24–36)

## 2021-09-01 LAB — BRAIN NATRIURETIC PEPTIDE: B Natriuretic Peptide: >4500 pg/mL — ABNORMAL HIGH (ref 0.0–100.0)

## 2021-09-01 LAB — MRSA NEXT GEN BY PCR, NASAL: MRSA by PCR Next Gen: NOT DETECTED

## 2021-09-01 LAB — HEPARIN LEVEL (UNFRACTIONATED): Heparin Unfractionated: <0.1 IU/mL — ABNORMAL LOW (ref 0.30–0.70)

## 2021-09-01 LAB — FERRITIN: Ferritin: 84 ng/mL (ref 24–336)

## 2021-09-01 MED ORDER — FUROSEMIDE 10 MG/ML IJ SOLN
80.0000 mg | Freq: Once | INTRAMUSCULAR | Status: AC
  Administered 2021-09-01: 80 mg via INTRAVENOUS
  Filled 2021-09-01: qty 8

## 2021-09-01 MED ORDER — MAGNESIUM SULFATE 2 GM/50ML IV SOLN
2.0000 g | Freq: Once | INTRAVENOUS | Status: AC
Start: 2021-09-01 — End: 2021-09-01
  Administered 2021-09-01: 2 g via INTRAVENOUS
  Filled 2021-09-01: qty 50

## 2021-09-01 MED ORDER — HEPARIN (PORCINE) 25000 UT/250ML-% IV SOLN
2000.0000 [IU]/h | INTRAVENOUS | Status: DC
  Administered 2021-09-01: 1500 [IU]/h via INTRAVENOUS
  Administered 2021-09-02: 06:00:00 1700 [IU]/h via INTRAVENOUS
  Administered 2021-09-02: 22:00:00 1750 [IU]/h via INTRAVENOUS
  Administered 2021-09-03 – 2021-09-06 (×5): 2000 [IU]/h via INTRAVENOUS
  Filled 2021-09-01 (×10): qty 250

## 2021-09-01 MED ORDER — CHLORHEXIDINE GLUCONATE CLOTH 2 % EX PADS
6.0000 | MEDICATED_PAD | Freq: Every day | CUTANEOUS | Status: DC
  Administered 2021-09-01 – 2021-09-06 (×6): 6 via TOPICAL

## 2021-09-01 MED ORDER — ACETAMINOPHEN 325 MG PO TABS
650.0000 mg | ORAL_TABLET | ORAL | Status: DC | PRN
  Administered 2021-09-01 – 2021-09-06 (×9): 650 mg via ORAL
  Filled 2021-09-01 (×9): qty 2

## 2021-09-01 MED ORDER — POTASSIUM CHLORIDE CRYS ER 20 MEQ PO TBCR
40.0000 meq | EXTENDED_RELEASE_TABLET | Freq: Once | ORAL | Status: AC
Start: 2021-09-01 — End: 2021-09-01
  Administered 2021-09-01: 40 meq via ORAL
  Filled 2021-09-01: qty 2

## 2021-09-01 MED ORDER — SODIUM CHLORIDE 0.9% FLUSH
10.0000 mL | Freq: Two times a day (BID) | INTRAVENOUS | Status: DC
  Administered 2021-09-01: 20 mL
  Administered 2021-09-02 (×2): 10 mL
  Administered 2021-09-03: 20 mL
  Administered 2021-09-04: 40 mL
  Administered 2021-09-04 – 2021-09-07 (×5): 10 mL

## 2021-09-01 MED ORDER — GUAIFENESIN-DM 100-10 MG/5ML PO SYRP
5.0000 mL | ORAL_SOLUTION | ORAL | Status: DC | PRN
  Administered 2021-09-01 – 2021-09-07 (×11): 5 mL via ORAL
  Filled 2021-09-01 (×11): qty 5

## 2021-09-01 MED ORDER — SODIUM CHLORIDE 0.9% FLUSH: 3.0000 mL | INTRAVENOUS | Status: DC | PRN

## 2021-09-01 MED ORDER — FUROSEMIDE 10 MG/ML IJ SOLN
80.0000 mg | Freq: Two times a day (BID) | INTRAMUSCULAR | Status: DC
  Administered 2021-09-01 – 2021-09-04 (×7): 80 mg via INTRAVENOUS
  Filled 2021-09-01 (×7): qty 8

## 2021-09-01 MED ORDER — SODIUM CHLORIDE 0.9% FLUSH: 10.0000 mL | INTRAVENOUS | Status: DC | PRN

## 2021-09-01 MED ORDER — MILRINONE LACTATE IN DEXTROSE 20-5 MG/100ML-% IV SOLN
0.3750 ug/kg/min | INTRAVENOUS | Status: DC
  Administered 2021-09-01 – 2021-09-02 (×2): 0.25 ug/kg/min via INTRAVENOUS
  Administered 2021-09-02 – 2021-09-05 (×8): 0.375 ug/kg/min via INTRAVENOUS
  Filled 2021-09-01 (×10): qty 100

## 2021-09-01 MED ORDER — SODIUM CHLORIDE 0.9% FLUSH
3.0000 mL | Freq: Two times a day (BID) | INTRAVENOUS | Status: DC
  Administered 2021-09-01 – 2021-09-02 (×2): 3 mL via INTRAVENOUS

## 2021-09-01 MED ORDER — SODIUM CHLORIDE 0.9 % IV SOLN: 250.0000 mL | INTRAVENOUS | Status: DC | PRN

## 2021-09-01 MED ORDER — SENNOSIDES-DOCUSATE SODIUM 8.6-50 MG PO TABS
1.0000 | ORAL_TABLET | Freq: Every day | ORAL | Status: DC
  Administered 2021-09-02 – 2021-09-06 (×2): 1 via ORAL
  Filled 2021-09-01 (×5): qty 1

## 2021-09-01 MED ORDER — DIGOXIN 125 MCG PO TABS
0.1250 mg | ORAL_TABLET | Freq: Every day | ORAL | Status: DC
Start: 2021-09-01 — End: 2021-09-07
  Administered 2021-09-01 – 2021-09-07 (×7): 0.125 mg via ORAL
  Filled 2021-09-01 (×7): qty 1

## 2021-09-01 NOTE — Progress Notes (Signed)
ANTICOAGULATION CONSULT NOTE - Initial Consult  Pharmacy Consult for Heparin  Indication: pulmonary embolus  No Known Allergies  Patient Measurements: Weight: 105.9 kg (233 lb 6.4 oz)  Vital Signs: BP: 84/62 (02/02 1213) Pulse Rate: 110 (02/02 1512)  Labs: Recent Labs    09/01/21 1200  HGB 8.5*  HCT 26.8*  PLT 306  CREATININE 1.19    Estimated Creatinine Clearance: 116.4 mL/min (by C-G formula based on SCr of 1.19 mg/dL).   Medical History: Past Medical History:  Diagnosis Date   Heart failure ([place])    Hypertension    Polysubstance abuse ([place])       Assessment: 25yom with HF EF < 20% admitted with soft BP and volume over load.   Previously diagnosed with PE earlier this month - stopped his apixaban on own.   Will start heparin drip until stable and procedures complete.  Goal of Therapy:  Heparin level 0.3-0.7 units/ml Monitor platelets by anticoagulation protocol: Yes   Plan:  Heparin drip 1500 uts/hr  Heparin level and aptt in 6hr Daily heparin level and CBC  Monitor s/s bleeding    Bonnita Nasuti Pharm.D. CPP, BCPS Clinical Pharmacist [phone] 09/01/2021 3:55 PM

## 2021-09-01 NOTE — Progress Notes (Signed)
PT Cancellation Note  Patient Details Name: Carlos Mckinney MRN: [id number] DOB: August 25, 1995   Cancelled Treatment:    Reason Eval/Treat Not Completed: Patient declined, no reason specified. Pt cleared for PT Eval by MD. Pt appearing to be willing to participate with PT initially, but once room was set up and cued pt to sit up EOB he stated "I can't", reporting his legs hurt and he was unable to mobilize on the lines/leads. Educated pt that he can move safely on lines/leads and that mobilizing may assist in reducing the edema in his legs. Pt continued to decline and agreed to participate in PT Eval tomorrow. Will follow-up tomorrow.   Raymond Gurney, PT, DPT Acute Rehabilitation Services  Pager: [phone] Office: [phone]    Jewel Baize 09/01/2021, 4:19 PM

## 2021-09-01 NOTE — Progress Notes (Signed)
Report called to 2 central [place] RN [phone], pt will be transported to unit by RN via wheelchair.

## 2021-09-01 NOTE — Progress Notes (Signed)
ADVANCED HF CLINIC NOTE  Primary Care: Pending Primary Cardiologist: Dr. Gala Romney  Reason for Visit: Acute on Chronic Systolic Heart Failure   HPI: Carlos Mckinney is a 26 y.o.male with hx tobacco use, cocaine abuse, obesity.  He's had occasional ED visits over the years.  No routine care.   ED visit 11/2020 with cough, dyspnea, tachycardia/tachypnea.  Chest x-ray with bilateral mild to moderate perihilar and bibasilar infiltrates.  Some suspicion for COVID, pt prescribed CAP coverage and left AMA before covid testing performed.    He was incarcerated from May - September 2022. Recently released from prison 04/25/2021.  Hospitalized at [facility] and transferred to [facility] 08/22 for acute systolic HF and was seen by Advanced Heart Failure team. Diuresed with IV lasix and transitioned to po furosemide 20 mg daily.  Started on GDMT with enstresto and spiro. Echo 08/22 with EF 15-20%, mildly dilated RV with normal RV function, mild MR, moderate TR, PAP 40-45 mmHg. Coronary angiogram not performed. 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 Reports heavy cocaine use for a period of time before incarcerated. Denies any recent drug use, still drinking ETOH occasionally.   Established care 05/02/21, mild volume overload and NYHA II-III. Lasix increased.  Seen in ED 05/24/21 for CP and swelling, left without being seen.  Seen back in clinic 11/22 and was fluid overloaded after running out of meds. Diuretics restarted and close f/u was advised but he no-showed the last 2 appts.   Presents today w/ worsening symptoms. NYHA Class IIIb + ~20 lb wt gain, worsening edema and marked fatigued. Feels poorly. Denies recent cocaine use but drank ETOH for his mother's birthday 3 days ago. Reports full med compliance but reports diuretics "stopped working" ~2 weeks ago. Living w/ his grandmother.   In clinic, he looks fatigued. SOB w/ exertion. EKG shows sinus tach 116 bmp. BP soft 90s systolic. Marked edema on exam. ReDs  clip 49%. Agreeable to direct admission for IV Lasix.   Review of systems complete and found to be negative unless listed in HPI.     SH: Homeless. Staying with grandmother presently. Unable to work, applying for disability.  No cocaine use since May 2022 (per Pt report).  Past medical history as above.  Current Outpatient Medications  Medication Sig Dispense Refill   albuterol (VENTOLIN HFA) 108 (90 Base) MCG/ACT inhaler Inhale 1-2 puffs into the lungs every 6 (six) hours as needed for wheezing or shortness of breath.     ferrous sulfate 325 (65 FE) MG tablet Take 1 tablet (325 mg total) by mouth 3 (three) times daily with meals. 100 tablet 3   furosemide (LASIX) 40 MG tablet Take 1 tablet (40 mg total) by mouth daily. (Patient taking differently: Take 40 mg by mouth 2 (two) times daily.) 30 tablet 3   sacubitril-valsartan (ENTRESTO) 24-26 MG Take 1 tablet by mouth 2 (two) times daily. 60 tablet 3   No current facility-administered medications for this encounter.   No Known Allergies  Social History   Socioeconomic History   Marital status: Single    Spouse name: Not on file   Number of children: Not on file   Years of education: Not on file   Highest education level: Not on file  Occupational History   Not on file  Tobacco Use   Smoking status: Former    Types: Cigarettes   Smokeless tobacco: Never  Substance and Sexual Activity   Alcohol use: No   Drug use: No   Sexual  activity: Not on file  Other Topics Concern   Not on file  Social History Narrative   Not on file   Social Determinants of Health   Financial Resource Strain: High Risk   Difficulty of Paying Living Expenses: Hard  Food Insecurity: No Food Insecurity   Worried About Running Out of Food in the Last Year: Never true   Ran Out of Food in the Last Year: Never true  Transportation Needs: No Transportation Needs   Lack of Transportation (Medical): No   Lack of Transportation (Non-Medical): No   Physical Activity: Not on file  Stress: Not on file  Social Connections: Not on file  Intimate Partner Violence: Not on file   FH: His maternal cousin has systolic HF and father has heart issues but details not known.   BP (!) 82/50    Pulse (!) 123    Wt 107.2 kg (236 lb 6.4 oz)    SpO2 99%    BMI 32.97 kg/m   Wt Readings from Last 3 Encounters:  09/01/21 107.2 kg (236 lb 6.4 oz)  08/07/21 101.7 kg (224 lb 4.8 oz)  07/18/21 101.6 kg (224 lb)   PHYSICAL EXAM: ReDS Clip 49%  General:  fatigued appearing. No respiratory difficulty HEENT: normal Neck: supple.JVD to jaw. Carotids 2+ bilat; no bruits. No lymphadenopathy or thyromegaly appreciated. Cor: PMI nondisplaced. Regular rhythm, tachy rate. No rubs, gallops or murmurs. Lungs: decreased BS at the bases bilaterally, faint crackles. No wheezing  Abdomen: obese, soft, nontender, nondistended. + abdominal striae No hepatosplenomegaly. No bruits or masses. Good bowel sounds. Extremities: no cyanosis, clubbing, rash, 2+ b/l LE edema up to knees, distal extremities cool  Neuro: alert & oriented x 3, cranial nerves grossly intact. moves all 4 extremities w/o difficulty. Affect pleasant.   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 Admit for a/c HF 08/22.  - Echo (8/22): EF 15-20%, mildly dilated RV with normal RV function, mild MR, moderate TR, PAP 40-45 mmHg - Etiology not certain. ? cocaine use, viral myocarditis or LV noncompaction. Father has heart issues and maternal cousin has systolic HF. Ischemic not likely at his age. LVH on ECG and h/o HTN, ? component of HTN CM.  - Marked Fluid overload, ReDs Clip 49%, Poor response to PO diuretics, NYHA Class IIIb. Concern for low-output HF - Admit for IV diuretics, 80 mg Lasix BID - Place PICC to check Co-ox and follow CVP, may need inotropic support w/ milrinone vs DBA - Check Lactic acid, BMP, BNP, Mg and  digoxin levels + UDS - Spiro 25 mg daily  - Hold Entresto w/ low BP  - Continue Digoxin 0.125 mg - No ? blocker  w/ presumed low output  - Place UNNA boots  - Repeat echo   - Needs R/LHC after diuresis  - cMRI if cath unrevealing  - cessation for cocaine and ETOH imperative   2. Hx cocaine abuse: - Reports he has not used since May 2022. - check UDS   3. Hypertension  - BP low in clinic today, concern for low-output HF (see above)  - Hold Entresto   4. H/o Snoring: - Sleep study once has insurance.  5. Substance Use: - cocaine/ETOH and tobacco use history  - Counseled on quitting. - check UDS    D/w Dr. Gala Romney, who has also seen and examined the patient. Will plan to admit to [facility] for IV diuretics  and possible inotropes.   Robbie Lis, PA-C 09/01/2021

## 2021-09-01 NOTE — H&P (Addendum)
Advanced Heart Failure Team History and Physical Note   PCP:  Pcp, No  PCP-Cardiology: Dr. Haroldine Laws   Reason for Admission: Acute on Chronic Systolic Heart Failure    HPI:    Carlos Mckinney is a 26 y.o.male with hx tobacco use, cocaine abuse, obesity.  He's had occasional ED visits over the years.  No routine care.    ED visit 11/2020 with cough, dyspnea, tachycardia/tachypnea.  Chest x-ray with bilateral mild to moderate perihilar and bibasilar infiltrates.  Some suspicion for COVID, pt prescribed CAP coverage and left AMA before covid testing performed.     He was incarcerated from May - September 2022. Recently released from prison 04/25/2021.   Hospitalized at [facility] and transferred to [facility] [id number] for acute systolic HF and was seen by Advanced Heart Failure team. Diuresed with IV lasix and transitioned to po furosemide 20 mg daily.  Started on GDMT with enstresto and spiro. Echo 08/22 with EF 15-20%, mildly dilated RV with normal RV function, mild MR, moderate TR, PAP 40-45 mmHg. Coronary angiogram not performed. 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 Reports heavy cocaine use for a period of time before incarcerated. Denies any recent drug use, still drinking ETOH occasionally.    Established care 05/02/21, mild volume overload and NYHA II-III. Lasix increased.   Seen in ED 05/24/21 for CP and swelling, left without being seen.   Seen back in clinic 11/22 and was fluid overloaded after running out of meds. Diuretics restarted and close f/u was advised but he no-showed the last 2 appts.   He was admitted 1/123 w/ LE swelling and dyspnea and diagnosed w/ rt sided DVT and b/l PE. Placed on IV heparin>>Eliquis. Also w/ a/c CHF and diuresed w/ IV Lasix.    Presents today w/ worsening symptoms. NYHA Class IIIb + ~20 lb wt gain, worsening edema and marked fatigued. Feels poorly. Denies recent cocaine use but drank ETOH for his mother's birthday 3 days ago. Reports full med compliance but  reports diuretics "stopped working" ~2 weeks ago. Living w/ his grandmother.    In clinic, he looks fatigued. SOB w/ exertion. EKG shows sinus tach 116 bmp. BP soft 0000000 systolic. Marked edema on exam. ReDs clip 49%. Agreeable to direct admission for IV Lasix.   He also reports that he self discontinued his Eliquis due to "blood in his stool". Bright red. Last noted 2 days ago. He was also noted previous hospitalization to have bleeding, felt 2/2 hemorrhoids. Hgb was 8.7 and iron stores low. He was prescribed ferrous sulfate.     Review of Systems: [y] = yes, [ ]  = no   General: Weight gain [Y]; Weight loss [ ] ; Anorexia [ ] ; Fatigue [ Y]; Fever [ ] ; Chills [ ] ; Weakness [Y ]  Cardiac: Chest pain/pressure [ ] ; Resting SOB [ Y]; Exertional SOB [ Y]; Orthopnea [Y ]; Pedal Edema [ Y]; Palpitations [ ] ; Syncope [ ] ; Presyncope [ ] ; Paroxysmal nocturnal dyspnea[ ]   Pulmonary: Cough Jazmín.Cullens ]; Wheezing[ ] ; Hemoptysis[ ] ; Sputum [ ] ; Snoring [ Y]  GI: Vomiting[ ] ; Dysphagia[ ] ; Melena[ ] ; Hematochezia [Y ]; Heartburn[ ] ; Abdominal pain [ ] ; Constipation [ ] ; Diarrhea [ ] ; BRBPR [ ]   GU: Hematuria[ ] ; Dysuria [ ] ; Nocturia[ ]   Vascular: Pain in legs with walking [ ] ; Pain in feet with lying flat [ ] ; Non-healing sores [ ] ; Stroke [ ] ; TIA [ ] ; Slurred speech [ ] ;  Neuro: Headaches[ ] ; Vertigo[ ] ; Seizures[ ] ; Paresthesias[ ] ;  Blurred vision [ ] ; Diplopia [ ] ; Vision changes [ ]   Ortho/Skin: Arthritis [ ] ; Joint pain [ ] ; Muscle pain [ ] ; Joint swelling [ ] ; Back Pain [ ] ; Rash [ ]   Psych: Depression[ ] ; Anxiety[ ]   Heme: Bleeding problems [ ] ; Clotting disorders [ ] ; Anemia [ ]   Endocrine: Diabetes [ ] ; Thyroid dysfunction[ ]    Home Medications Prior to Admission medications   Medication Sig Start Date End Date Taking? Authorizing Provider  albuterol (VENTOLIN HFA) 108 (90 Base) MCG/ACT inhaler Inhale 1-2 puffs into the lungs every 6 (six) hours as needed for wheezing or shortness of breath.    [provider]  ferrous sulfate 325 (65 FE) MG tablet Take 1 tablet (325 mg total) by mouth 3 (three) times daily with meals. 08/08/21   Pokhrel, Corrie Mckusick, MD  furosemide (LASIX) 40 MG tablet Take 1 tablet (40 mg total) by mouth daily. Patient taking differently: Take 40 mg by mouth 2 (two) times daily. 06/07/21   Milford, Maricela Bo, FNP  sacubitril-valsartan (ENTRESTO) 24-26 MG Take 1 tablet by mouth 2 (two) times daily. 07/19/21   Lizvette Lightsey, Shaune Pascal, MD    Past Medical History: Past Medical History:  Diagnosis Date   Heart failure ([place])    Hypertension    Polysubstance abuse [facility])     Past Surgical History: No past surgical history on file.  Family History:  Family History  Problem Relation Age of Onset   Hypertension Mother     Social History: Social History   Socioeconomic History   Marital status: Single    Spouse name: Not on file   Number of children: Not on file   Years of education: Not on file   Highest education level: Not on file  Occupational History   Not on file  Tobacco Use   Smoking status: Former    Types: Cigarettes   Smokeless tobacco: Never  Substance and Sexual Activity   Alcohol use: No   Drug use: No   Sexual activity: Not on file  Other Topics Concern   Not on file  Social History Narrative   Not on file   Social Determinants of Health   Financial Resource Strain: High Risk   Difficulty of Paying Living Expenses: Hard  Food Insecurity: No Food Insecurity   Worried About Running Out of Food in the Last Year: Never true   Ran Out of Food in the Last Year: Never true  Transportation Needs: No Transportation Needs   Lack of Transportation (Medical): No   Lack of Transportation (Non-Medical): No  Physical Activity: Not on file  Stress: Not on file  Social Connections: Not on file    Allergies:  No Known Allergies  Objective:    Vital Signs:   BP: 90/60  HR: 116 bpm O2 Sats: 99% on RA  Physical Exam     ReDS Clip 49%  General:   fatigued appearing. No respiratory difficulty HEENT: normal Neck: supple.JVD to jaw. Carotids 2+ bilat; no bruits. No lymphadenopathy or thyromegaly appreciated. Cor: PMI nondisplaced. Regular rhythm, tachy rate. No rubs, gallops or murmurs. Lungs: decreased BS at the bases bilaterally, faint crackles. No wheezing  Abdomen: obese, soft, nontender, nondistended. + abdominal striae No hepatosplenomegaly. No bruits or masses. Good bowel sounds. Extremities: no cyanosis, clubbing, rash, 2+ b/l LE edema up to knees, distal extremities cool  Neuro: alert & oriented x 3, cranial nerves grossly intact. moves all 4 extremities w/o difficulty. Affect pleasant.   Telemetry  N/A  EKG   Sinus tach 116 bpm, low voltage, personally reviewed   Labs     Basic Metabolic Panel: No results for input(s): NA, K, CL, CO2, GLUCOSE, BUN, CREATININE, CALCIUM, MG, PHOS in the last 168 hours.  Liver Function Tests: No results for input(s): AST, ALT, ALKPHOS, BILITOT, PROT, ALBUMIN in the last 168 hours. No results for input(s): LIPASE, AMYLASE in the last 168 hours. No results for input(s): AMMONIA in the last 168 hours.  CBC: Recent Labs  Lab 09/01/21 1200  WBC 10.7*  HGB 8.5*  HCT 26.8*  MCV 78.1*  PLT 306    Cardiac Enzymes: No results for input(s): CKTOTAL, CKMB, CKMBINDEX, TROPONINI in the last 168 hours.  BNP: BNP (last 3 results) Recent Labs    06/07/21 1110 07/19/21 0046 08/06/21 1629  BNP 2,805.7* 2,693.0* 3,697.9*    ProBNP (last 3 results) No results for input(s): PROBNP in the last 8760 hours.   CBG: No results for input(s): GLUCAP in the last 168 hours.  Coagulation Studies: No results for input(s): LABPROT, INR in the last 72 hours.  Imaging: No results found.   Patient Profile   26 y/o male w/ h/o chronic systolic heart failure of uncertain etiology, polysubstance use and poor compliance, direct admitted from clinic for a/c CHF w/ marked fluid overload, NYHA  Class IIIb symptoms and concern for low output HF.   Assessment/Plan   1. 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 Admit for a/c HF 08/22.  - Echo (8/22): EF 15-20%, mildly dilated RV with normal RV function, mild MR, moderate TR, PAP 40-45 mmHg - Etiology not certain. ? cocaine use, viral myocarditis or LV noncompaction. Father has heart issues and maternal cousin has systolic HF. Ischemic not likely at his age. LVH on ECG and h/o HTN, ? component of HTN CM.  - Marked Fluid overload, ReDs Clip 49%, Poor response to PO diuretics, NYHA Class IIIb. Concern for low-output HF - Admit for IV diuretics, 80 mg Lasix BID - Place PICC to check Co-ox and follow CVP, may need inotropic support w/ milrinone vs DBA - Check Lactic acid, BMP, BNP, Mg and digoxin levels + UDS - Spiro 25 mg daily  - Hold Entresto w/ low BP  - Continue Digoxin 0.125 mg - No ? blocker w/ presumed low output  - Repeat echo   - Needs R/LHC after diuresis  - cMRI if cath unrevealing  - cessation for cocaine and ETOH imperative    2. Hx cocaine abuse: - Reports he has not used since May 2022. - check UDS    3. Hypertension  - BP low in clinic today, concern for low-output HF (see above)  - Hold Entresto    4. H/o Snoring: - Sleep study once has insurance.   5. Substance Use: - cocaine/ETOH and tobacco use history  - Counseled on quitting. - check UDS    6. PE/ Rt LE DVT - diagnosed 08/06/21, placed on Eliquis - pt self discontinue Eliquis ~1 wk ago due to hematochezia. Denies further bleeding in the last 2 days - heparin per pharmacy. Check CBC and follow for further bleeding  7. Recent Hematochezia/ Iron Deficiency Anemia  - felt 2/2 hemorrhoids. Noted previous admit 1/23. Hgb 8.7 w/ low iron stores - per above, start heparin gtt for PE/DVT - monitor for bleeding and follow CBC - GI consult if recurs  - bowel regimen (stool softeners) - recheck iron  stores,  may need feraheme (doubt compliant w/ PO Fe)    Lyda Jester, PA-C 09/01/2021, 12:50 PM  Advanced Heart Failure Team Pager [phone] (M-F; 7a - 5p)  Please contact [facility] for night-coverage after hours (4p -7a ) and weekends on [email or web address]  Agree with above.  26 y/o male with h/o drug abuse and severe systolic HF presented to clinic today with 2 weeks of worsening HF. Reports compliance with meds. Oral diuretics no longer working. Volume overloaded and lethargic.   PICC placed Co-ox 23.9% ReDs 49%  Denies Ongoing drug use.  General:  Ill appearing. Lethargic  HEENT: normal Neck: supple. JVP to jaw Carotids 2+ bilat; no bruits. No lymphadenopathy or thryomegaly appreciated. Cor: PMI nondisplaced. Regular tachy +s3 Lungs: clear Abdomen: soft, nontender, +distended. No hepatosplenomegaly. No bruits or masses. Good bowel sounds. Extremities: no cyanosis, clubbing, rash, 2-3+ edema  cool  Neuro: alert & orientedx3, cranial nerves grossly intact. moves all 4 extremities w/o difficulty. Affect pleasant    He has low output HF with marked volume overload. Co-ox 23.9%(!). Will start milrinone at 0.375 and IV diuresis. Low threshold to escalated to mechanical support though advanced therapies may be limited due to social situation. Repeat echo. Continue heparin. Check UDS. Will need Feraheme when volume status improves.   CRITICAL CARE Performed by: Glori Bickers  Total critical care time: 45 minutes  Critical care time was exclusive of separately billable procedures and treating other patients.  Critical care was necessary to treat or prevent imminent or life-threatening deterioration.  Critical care was time spent personally by me (independent of midlevel providers or residents) on the following activities: development of treatment plan with patient and/or surrogate as well as nursing, discussions with consultants, evaluation of patient's response to treatment, examination  of patient, obtaining history from patient or surrogate, ordering and performing treatments and interventions, ordering and review of laboratory studies, ordering and review of radiographic studies, pulse oximetry and re-evaluation of patient's condition.  Glori Bickers, MD  6:42 PM

## 2021-09-01 NOTE — Progress Notes (Signed)
Peripherally Inserted Central Catheter Placement  The IV Nurse has discussed with the patient and/or persons authorized to consent for the patient, the purpose of this procedure and the potential benefits and risks involved with this procedure.  The benefits include less needle sticks, lab draws from the catheter, and the patient may be discharged home with the catheter. Risks include, but not limited to, infection, bleeding, blood clot (thrombus formation), and puncture of an artery; nerve damage and irregular heartbeat and possibility to perform a PICC exchange if needed/ordered by physician.  Alternatives to this procedure were also discussed.  Bard Power PICC patient education guide, fact sheet on infection prevention and patient information card has been provided to patient /or left at bedside.    PICC Placement Documentation  PICC Double Lumen 09/01/21 PICC Right Cephalic 40 cm 1 cm (Active)  Indication for Insertion or Continuance of Line Vasoactive infusions 09/01/21 1814  Exposed Catheter (cm) 1 cm 09/01/21 1814  Site Assessment Clean;Dry;Intact 09/01/21 1814  Lumen #1 Status Flushed;Saline locked;Blood return noted 09/01/21 1814  Lumen #2 Status Flushed;Saline locked;Blood return noted 09/01/21 1814  Dressing Type Transparent;Securing device 09/01/21 1814  Dressing Status Clean;Dry;Intact 09/01/21 1814  Antimicrobial disc in place? Yes 09/01/21 1814  Safety Lock Not Applicable [id number]  Line Adjustment (NICU/IV Team Only) No 09/01/21 1814  Dressing Intervention New dressing;Other (Comment) 09/01/21 1814  Dressing Change Due 09/08/21 09/01/21 1814       Enos Fling 09/01/2021, 6:15 PM

## 2021-09-01 NOTE — Telephone Encounter (Signed)
Pt seen 2/2 and admitted

## 2021-09-01 NOTE — Progress Notes (Signed)
ReDS Vest / Clip - 09/01/21 1100       ReDS Vest / Clip   Station Marker D    Ruler Value 31.5    ReDS Actual Value 49

## 2021-09-01 NOTE — Addendum Note (Signed)
Encounter addended by: Payton Mccallum, RN on: 09/01/2021 1:59 PM  Actions taken: Clinical Note Signed

## 2021-09-02 ENCOUNTER — Inpatient Hospital Stay (HOSPITAL_COMMUNITY): Payer: Medicaid Other

## 2021-09-02 ENCOUNTER — Encounter (HOSPITAL_COMMUNITY): Payer: Self-pay | Admitting: Internal Medicine

## 2021-09-02 DIAGNOSIS — I5021 Acute systolic (congestive) heart failure: Secondary | ICD-10-CM | POA: Diagnosis not present

## 2021-09-02 DIAGNOSIS — I5033 Acute on chronic diastolic (congestive) heart failure: Secondary | ICD-10-CM | POA: Diagnosis not present

## 2021-09-02 LAB — COOXEMETRY PANEL
Carboxyhemoglobin: 2.3 % — ABNORMAL HIGH (ref 0.5–1.5)
Carboxyhemoglobin: 2.7 % — ABNORMAL HIGH (ref 0.5–1.5)
Methemoglobin: 0.8 % (ref 0.0–1.5)
Methemoglobin: 0.8 % (ref 0.0–1.5)
O2 Saturation: 50.6 %
O2 Saturation: 62.4 %
Total hemoglobin: 7.6 g/dL — ABNORMAL LOW (ref 12.0–16.0)
Total hemoglobin: 7.7 g/dL — ABNORMAL LOW (ref 12.0–16.0)

## 2021-09-02 LAB — ECHOCARDIOGRAM COMPLETE
AR max vel: 2.76 cm2
AV Area VTI: 2.97 cm2
AV Area mean vel: 2.35 cm2
AV Mean grad: 3 mmHg
AV Peak grad: 4.8 mmHg
Ao pk vel: 1.09 m/s
S' Lateral: 7.3 cm
Weight: 3703.73 oz

## 2021-09-02 LAB — RESP PANEL BY RT-PCR (FLU A&B, COVID) ARPGX2
Influenza A by PCR: NEGATIVE
Influenza B by PCR: NEGATIVE
SARS Coronavirus 2 by RT PCR: NEGATIVE

## 2021-09-02 LAB — CBC
HCT: 23.2 % — ABNORMAL LOW (ref 39.0–52.0)
Hemoglobin: 7.6 g/dL — ABNORMAL LOW (ref 13.0–17.0)
MCH: 25.4 pg — ABNORMAL LOW (ref 26.0–34.0)
MCHC: 32.8 g/dL (ref 30.0–36.0)
MCV: 77.6 fL — ABNORMAL LOW (ref 80.0–100.0)
Platelets: 267 10*3/uL (ref 150–400)
RBC: 2.99 MIL/uL — ABNORMAL LOW (ref 4.22–5.81)
RDW: 21.2 % — ABNORMAL HIGH (ref 11.5–15.5)
WBC: 7.7 10*3/uL (ref 4.0–10.5)
nRBC: 0 % (ref 0.0–0.2)

## 2021-09-02 LAB — BASIC METABOLIC PANEL
Anion gap: 7 (ref 5–15)
BUN: 10 mg/dL (ref 6–20)
CO2: 23 mmol/L (ref 22–32)
Calcium: 8.2 mg/dL — ABNORMAL LOW (ref 8.9–10.3)
Chloride: 98 mmol/L (ref 98–111)
Creatinine, Ser: 1.1 mg/dL (ref 0.61–1.24)
GFR, Estimated: >60 mL/min (ref >60)
Glucose, Bld: 106 mg/dL — ABNORMAL HIGH (ref 70–99)
Potassium: 3.6 mmol/L (ref 3.5–5.1)
Sodium: 128 mmol/L — ABNORMAL LOW (ref 135–145)

## 2021-09-02 LAB — HEPARIN LEVEL (UNFRACTIONATED): Heparin Unfractionated: 0.3 IU/mL (ref 0.30–0.70)

## 2021-09-02 LAB — MAGNESIUM: Magnesium: 1.9 mg/dL (ref 1.7–2.4)

## 2021-09-02 MED ORDER — POTASSIUM CHLORIDE CRYS ER 20 MEQ PO TBCR
40.0000 meq | EXTENDED_RELEASE_TABLET | Freq: Four times a day (QID) | ORAL | Status: DC
  Administered 2021-09-02: 40 meq via ORAL
  Filled 2021-09-02: qty 2

## 2021-09-02 MED ORDER — SODIUM CHLORIDE 0.9 % IV SOLN
510.0000 mg | Freq: Once | INTRAVENOUS | Status: AC
  Administered 2021-09-02: 510 mg via INTRAVENOUS
  Filled 2021-09-02: qty 17

## 2021-09-02 MED ORDER — SPIRONOLACTONE 12.5 MG HALF TABLET
12.5000 mg | ORAL_TABLET | Freq: Every day | ORAL | Status: DC
  Administered 2021-09-02 – 2021-09-03 (×2): 12.5 mg via ORAL
  Filled 2021-09-02 (×2): qty 1

## 2021-09-02 MED ORDER — MAGNESIUM SULFATE 2 GM/50ML IV SOLN
2.0000 g | Freq: Once | INTRAVENOUS | Status: AC
  Administered 2021-09-02: 2 g via INTRAVENOUS
  Filled 2021-09-02: qty 50

## 2021-09-02 MED ORDER — HEPARIN BOLUS VIA INFUSION
3000.0000 [IU] | Freq: Once | INTRAVENOUS | Status: AC
  Administered 2021-09-02: 3000 [IU] via INTRAVENOUS
  Filled 2021-09-02: qty 3000

## 2021-09-02 MED ORDER — METOLAZONE 2.5 MG PO TABS
5.0000 mg | ORAL_TABLET | Freq: Once | ORAL | Status: AC
  Administered 2021-09-02: 5 mg via ORAL
  Filled 2021-09-02: qty 2

## 2021-09-02 MED ORDER — POTASSIUM CHLORIDE 20 MEQ PO PACK
40.0000 meq | PACK | Freq: Once | ORAL | Status: AC
  Administered 2021-09-02: 40 meq via ORAL
  Filled 2021-09-02: qty 2

## 2021-09-02 MED ORDER — POTASSIUM CHLORIDE CRYS ER 20 MEQ PO TBCR
40.0000 meq | EXTENDED_RELEASE_TABLET | Freq: Once | ORAL | Status: AC
  Administered 2021-09-02: 40 meq via ORAL
  Filled 2021-09-02: qty 2

## 2021-09-02 MED ORDER — PERFLUTREN LIPID MICROSPHERE
1.0000 mL | INTRAVENOUS | Status: AC | PRN
  Administered 2021-09-02: 3 mL via INTRAVENOUS
  Filled 2021-09-02: qty 10

## 2021-09-02 NOTE — Progress Notes (Signed)
Heart Failure Nurse Navigator Progress Note  AHF rounding team following this admission.  Kevan Rosebush, RN, BSN, [facility] HF Navigator//Specialty Coordinator Heart & Vascular Navigation Team

## 2021-09-02 NOTE — Progress Notes (Signed)
PT Cancellation Note  Patient Details Name: Carlos Mckinney MRN: [id number] DOB: 1996/03/20   Cancelled Treatment:    Reason Eval/Treat Not Completed: PT screened, no needs identified, will sign off. Pt seen by mobility specialist and he is independent with mobility once his lines are arranged to allow him to move.    [name] 09/02/2021, 2:22 PM Skip Mayer PT Acute Rehabilitation Services Pager [phone] Office [phone]

## 2021-09-02 NOTE — Evaluation (Signed)
Occupational Therapy Evaluation Patient Details Name: Carlos Mckinney MRN: [id number] DOB: [birth year] Today's Date: 09/02/2021   History of Present Illness 26 y/o male w/ h/o chronic systolic heart failure of uncertain etiology, polysubstance use and poor compliance, direct admitted from clinic for a/c CHF w/ marked fluid overload, NYHA Class IIIb symptoms and concern for low output HF.   Clinical Impression   Pt reports PTA he was independent with ADL/IADL and functional mobility. Pt received sitting in recliner with family present. Pt demonstrates decreased understanding of importance of monitoring I&O and of medication compliance. MD was present during session and re-enforced importance of adherence to medication management/treatment. Pt appeared more receptive to change following this discussion. Pt would benefit from continued OT services to address daily habits and routines that effect his health. Pt was receptive to education regarding importance of monitoring I&O, a heart healthy diet, use of energy conservation strategies, and importance of mobility. OT will continue to follow acutely.      Recommendations for follow up therapy are one component of a multi-disciplinary discharge planning process, led by the attending physician.  Recommendations may be updated based on patient status, additional functional criteria and insurance authorization.   Follow Up Recommendations  No OT follow up    Assistance Recommended at Discharge    Patient can return home with the following      Functional Status Assessment  Patient has had a recent decline in their functional status and demonstrates the ability to make significant improvements in function in a reasonable and predictable amount of time.  Equipment Recommendations       Recommendations for Other Services       Precautions / Restrictions Precautions Precautions: Fall Restrictions Weight Bearing Restrictions: No      Mobility Bed  Mobility               General bed mobility comments: not assessed;pt sitting in recliner upon arrival    Transfers                   General transfer comment: modified independent after assistance with lines      Balance Overall balance assessment: No apparent balance deficits (not formally assessed)                                         ADL either performed or assessed with clinical judgement   ADL Overall ADL's : Modified independent                                       General ADL Comments: requiring increased time and effort, pt was able to access feet with increased effort for LB dressing. Began discussion regarding energy conservation strategies during ADL/IADL.     Vision         Perception     Praxis      Pertinent Vitals/Pain Pain Assessment Pain Assessment: Faces Faces Pain Scale: Hurts a little bit Pain Location: ribs when coughing Pain Descriptors / Indicators: Grimacing, Guarding Pain Intervention(s): Monitored during session     Hand Dominance Right   Extremity/Trunk Assessment Upper Extremity Assessment Upper Extremity Assessment: Overall WFL for tasks assessed   Lower Extremity Assessment Lower Extremity Assessment: Overall WFL for tasks assessed   Cervical / Trunk Assessment Cervical / Trunk  Assessment: Normal   Communication Communication Communication: No difficulties   Cognition Arousal/Alertness: Awake/alert Behavior During Therapy: WFL for tasks assessed/performed Overall Cognitive Status: Within Functional Limits for tasks assessed                                 General Comments: decreased awareness of significance of diagnosis and importance of full compliance     General Comments  HR 114bpm at rest    Exercises     Shoulder Instructions      Home Living Family/patient expects to be discharged to:: Private residence Living Arrangements: Spouse/significant  other Available Help at Discharge: Family;Available PRN/intermittently                             Additional Comments: pt reports he has 4 kids at home      Prior Functioning/Environment Prior Level of Function : Independent/Modified Independent                        OT Problem List: Decreased activity tolerance;Cardiopulmonary status limiting activity;Obesity;Decreased knowledge of precautions      OT Treatment/Interventions: Self-care/ADL training;Energy conservation;DME and/or AE instruction;Therapeutic activities;Patient/family education    OT Goals(Current goals can be found in the care plan section) Acute Rehab OT Goals Patient Stated Goal: to live OT Goal Formulation: With patient Time For Goal Achievement: 09/16/21 Potential to Achieve Goals: Fair ADL Goals Additional ADL Goal #1: Pt will demonstrate independence with 3 energy conservation strategies during IADL to maintain HR WNL. Additional ADL Goal #2: Pt will demonstrate understanding and independence with monitoring input/output to reduce risk for re-admission.  OT Frequency: Min 1X/week    Co-evaluation              AM-PAC OT "6 Clicks" Daily Activity     Outcome Measure Help from another person eating meals?: None Help from another person taking care of personal grooming?: None Help from another person toileting, which includes using toliet, bedpan, or urinal?: None Help from another person bathing (including washing, rinsing, drying)?: None Help from another person to put on and taking off regular upper body clothing?: None Help from another person to put on and taking off regular lower body clothing?: None 6 Click Score: 24   End of Session Nurse Communication: Mobility status  Activity Tolerance: Patient tolerated treatment well Patient left: in chair;with call bell/phone within reach;with nursing/sitter in room  OT Visit Diagnosis: Other abnormalities of gait and mobility  (R26.89)                Time: [id number] OT Time Calculation (min): 30 min Charges:  OT General Charges $OT Visit: 1 Visit OT Evaluation $OT Eval Moderate Complexity: 1 Mod  Oriya Kettering OTR/L Acute Rehabilitation Services Office: [place] 09/02/2021, 4:43 PM

## 2021-09-02 NOTE — Progress Notes (Addendum)
Advanced Heart Failure Rounding Note  PCP-Cardiologist: None   Subjective:   Admitted with cardiogenic shock and marked volume overload. CO-OX 24%--> Started on milrinone 0.25 mcg.   Diuresing with IV lasix. Negative 2 liters.   Todays CO-OX 51% on milrinone 0.25 mcg. Repeating CO-OX now.  UDS + cocaine, amphetamine, THC   Wants to know when he can go home. Denies SOB.    Objective:   Weight Range: 105 kg Body mass index is 32.29 kg/m.   Vital Signs:   Temp:  [98.4 F (36.9 C)-99.9 F (37.7 C)] 98.4 F (36.9 C) (02/03 0745) Pulse Rate:  [106-121] 107 (02/03 1000) Resp:  [10-26] 10 (02/03 0745) BP: (93-111)/(55-79) 111/62 (02/03 0745) SpO2:  [97 %-100 %] 98 % (02/03 0745) Weight:  [105 kg-105.9 kg] 105 kg (02/03 0525)    Weight change: Filed Weights   09/01/21 1400 09/02/21 0525  Weight: 105.9 kg 105 kg    Intake/Output:   Intake/Output Summary (Last 24 hours) at 09/02/2021 1055 Last data filed at 09/02/2021 0305 Gross per 24 hour  Intake 360 ml  Output 2425 ml  Net -2065 ml      Physical Exam   CVP 8 General:   No resp difficulty HEENT: Normal Neck: Supple. JVP 8-9 . Carotids 2+ bilat; no bruits. No lymphadenopathy or thyromegaly appreciated. Cor: PMI nondisplaced. Regular rate & rhythm. No rubs or murmurs. + S3  Lungs: Clear Abdomen: Soft, nontender, nondistended. No hepatosplenomegaly. No bruits or masses. Good bowel sounds. Extremities: No cyanosis, clubbing, rash, R and LLE 1+ edema. RUE PICC  Neuro: Alert & orientedx3, cranial nerves grossly intact. moves all 4 extremities w/o difficulty. Affect flat    Telemetry  ST 100s   EKG    N/A   Labs    CBC Recent Labs    09/01/21 1200  WBC 10.7*  HGB 8.5*  HCT 26.8*  MCV 78.1*  PLT 306   Basic Metabolic Panel Recent Labs    14/43/15 1200 09/02/21 0600  NA 129* 128*  K 3.9 3.6  CL 97* 98  CO2 22 23  GLUCOSE 104* 106*  BUN 9 10  CREATININE 1.19 1.10  CALCIUM 8.6* 8.2*  MG 1.8  1.9   Liver Function Tests Recent Labs    09/01/21 1200  AST 27  ALT 26  ALKPHOS 69  BILITOT 2.6*  PROT 6.2*  ALBUMIN 2.8*   No results for input(s): LIPASE, AMYLASE in the last 72 hours. Cardiac Enzymes No results for input(s): CKTOTAL, CKMB, CKMBINDEX, TROPONINI in the last 72 hours.  BNP: BNP (last 3 results) Recent Labs    07/19/21 0046 08/06/21 1629 09/01/21 1200  BNP 2,693.0* 3,697.9* >4,500.0*    ProBNP (last 3 results) No results for input(s): PROBNP in the last 8760 hours.   D-Dimer No results for input(s): DDIMER in the last 72 hours. Hemoglobin A1C No results for input(s): HGBA1C in the last 72 hours. Fasting Lipid Panel No results for input(s): CHOL, HDL, LDLCALC, TRIG, CHOLHDL, LDLDIRECT in the last 72 hours. Thyroid Function Tests No results for input(s): TSH, T4TOTAL, T3FREE, THYROIDAB in the last 72 hours.  Invalid input(s): FREET3  Other results:   Imaging    [place] EKG SITE RITE  Result Date: 09/01/2021 If Site Rite image not attached, placement could not be confirmed due to current cardiac rhythm.    Medications:     Scheduled Medications:  Chlorhexidine Gluconate Cloth  6 each Topical Daily   digoxin  0.125  mg Oral Daily   furosemide  80 mg Intravenous BID   senna-docusate  1 tablet Oral QHS   sodium chloride flush  10-40 mL Intracatheter Q12H   sodium chloride flush  3 mL Intravenous Q12H    Infusions:  sodium chloride     heparin 1,700 Units/hr (09/02/21 0558)   milrinone 0.25 mcg/kg/min (09/02/21 0919)    PRN Medications: sodium chloride, acetaminophen, guaiFENesin-dextromethorphan, sodium chloride flush, sodium chloride flush    Patient Profile   26 y/o male with h/o drug abuse and severe systolic HF presented to clinic today with 2 weeks of worsening HF.   Admitted with cardiogenic shock and marked volume overload.   Assessment/Plan   1. 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 Admit for a/c HF 08/22.  - Echo (8/22): EF 15-20%, mildly dilated RV with normal RV function, mild MR, moderate TR, PAP 40-45 mmHg - Echo 08/07/21 EF < 20%.  - Etiology not certain. ? cocaine use, viral myocarditis or LV noncompaction. Father has heart issues and maternal cousin has systolic HF. Ischemic not likely at his age. LVH on ECG and h/o HTN, ? component of HTN CM.  - Lactic Acid 1.8  - CVP 8 . Needs additional IV lasix.  - Initial CO-OX 24% --> 51%. Increase milrinone to 0.375 mcg.   - Hold MRA/ARNi/BB wti  - Continue Digoxin 0.125 mg - Needs R/LHC after diuresis if he stays  - cMRI if cath unrevealing  - Not a candidate for advanced therapies with polysubstance abuse.     2. . H/o Snoring: - Sleep study once has insurance.   3 . Substance Use: - cocaine/ETOH and tobacco use history  - Counseled on quitting. - UDS on admit -->+ cocaine  + THC   5. PE/ Rt LE DVT - diagnosed 08/06/21, placed on Eliquis - pt self discontinue Eliquis ~1 wk ago due to hematochezia. Denies further bleeding in the last 2 days - heparin per pharmacy.   7. Recent Hematochezia/ Iron Deficiency Anemia  - felt 2/2 hemorrhoids. Noted previous admit 1/23. Hgb 8.7 w/ low iron stores - per above, start heparin gtt for PE/DVT - monitor for bleeding and follow CBC - GI consult if recurs  - bowel regimen (stool softeners) - Iron sats low. Given dose of feraheme.    If he decides to d/c tomorrow will need to make sure PICC line is removed.    Length of Stay: 1  Amy Clegg, NP  09/02/2021, 10:55 AM  Advanced Heart Failure Team Pager [phone] (M-F; 7a - 5p)  Please contact [facility] for night-coverage after hours (5p -7a ) and weekends on [email or web address]  Patient seen and examined with the above-signed Advanced Practice Provider and/or Housestaff. I personally reviewed laboratory data, imaging studies and relevant notes. I independently examined the patient and  formulated the important aspects of the plan. I have edited the note to reflect any of my changes or salient points. I have personally discussed the plan with the patient and/or family.  He is feeling better on milrinone. Co-ox remains marginal. Diuresing well. Drinking lots of fluids. Asking if he is going home tomorrow. Confrontational with staff  General:  Sitting in chair No resp difficulty HEENT: normal Neck: supple. JVP 10  Carotids 2+ bilat; no bruits. No lymphadenopathy or thryomegaly appreciated. Cor: Regular tachy + s3 No rubs, gallops or murmurs. Lungs: clear Abdomen: soft, nontender, nondistended. No hepatosplenomegaly. No  bruits or masses. Good bowel sounds. Extremities: no cyanosis, clubbing, rash, 1+ edema Neuro: alert & orientedx3, cranial nerves grossly intact. moves all 4 extremities w/o difficulty. Affect pleasant  He is very tenuous. Admitted with profound shock. Now improved with milrinone. I had a long talk with him about how sick he is and the likelihood that he will die soon without aggressive GDMT and possibly advanced therapies.   Unfortunately drug screen positive on admit for cocaine/THC and he remains confrontational.   Will continue milrinone and IV diuresis for now. Continue digoxin. Add spiro. Ideally would have R/L cath but not sure he will agree to it. Will place fluid restriction.   I made it clear that we are willing to stand behind him and help him but he has to be willing to change and cooperate with therapies/staff. I will circle back to discuss with him again.   Glori Bickers, MD  11:53 AM

## 2021-09-02 NOTE — TOC Initial Note (Signed)
Transition of Care [facility]) - Initial/Assessment Note    Patient Details  Name: Carlos Mckinney MRN: [id number] Date of Birth: 01-Feb-1996  [facility] CM/SW Contact:    Erenest Rasher, RN Phone Number: [phone] 09/02/2021, 5:12 PM  Clinical Narrative:                 HF TOC CM spoke to pt at bedside. States he should have Medicaid. Explained Medicaid is not showing in the system. Will assist pt with medication using MATCH and HF fund. Will arrange PCP follow up appt at [facility] (after hours and unable to arrange appt). Pt reports he has scale provided to him from HF Clinic.   Expected Discharge Plan: Home/Self Care Barriers to Discharge: Continued Medical Work up   Patient Goals and CMS Choice   CMS Medicare.gov Compare Post Acute Care list provided to:: Patient    Expected Discharge Plan and Services Expected Discharge Plan: Home/Self Care   Discharge Planning Services: CM Consult   Living arrangements for the past 2 months: Apartment                                      Prior Living Arrangements/Services Living arrangements for the past 2 months: Apartment Lives with:: Friends Patient language and need for interpreter reviewed:: Yes Do you feel safe going back to the place where you live?: Yes      Need for Family Participation in Patient Care: No (Comment) Care giver support system in place?: No (comment)   Criminal Activity/Legal Involvement Pertinent to Current Situation/Hospitalization: No - Comment as needed  Activities of Daily Living      Permission Sought/Granted Permission sought to share information with : Case Manager, Family Supports Permission granted to share information with : Yes, Verbal Permission Granted  Share Information with NAME: Melba Herbin     Permission granted to share info w Relationship: mother  Permission granted to share info w Contact Information: [phone]  Emotional Assessment Appearance::  Appears older than stated age Attitude/Demeanor/Rapport: Gracious Affect (typically observed): Accepting Orientation: : Oriented to Self, Oriented to Place, Oriented to  Time, Oriented to Situation   Psych Involvement: No (comment)  Admission diagnosis:  Acute on chronic diastolic congestive heart failure, NYHA class 3 (HCC) [I50.33] Patient Active Problem List   Diagnosis Date Noted   Acute on chronic diastolic congestive heart failure, NYHA class 3 (HCC) 09/01/2021   Iron deficiency anemia 08/07/2021   Multiple pulmonary emboli ([place]) 08/06/2021   Acute on chronic HFrEF (heart failure with reduced ejection fraction) ([place]) 08/06/2021   Acute deep vein thrombosis (DVT) of right lower extremity ([place]) 08/06/2021   Anemia 08/06/2021   PCP:  Pcp, No Pharmacy:   [facility] [address] [place] [address] Phone: [phone] Fax: [phone]     Social Determinants of Health (SDOH) Interventions    Readmission Risk Interventions No flowsheet data found.

## 2021-09-02 NOTE — Progress Notes (Signed)
ANTICOAGULATION CONSULT NOTE  Pharmacy Consult for Heparin Indication: pulmonary embolus  No Known Allergies  Patient Measurements: Weight: 105 kg (231 lb 7.7 oz)  Heparin Dosing Weight: 106 kg  Vital Signs: Temp: 97.9 F (36.6 C) (02/03 1550) Temp Source: Oral (02/03 1550) BP: 93/68 (02/03 1550) Pulse Rate: 109 (02/03 1550)  Labs: Recent Labs    09/01/21 1200 09/01/21 2315 09/02/21 0600 09/02/21 1154 09/02/21 1550  HGB 8.5*  --   --  7.6*  --   HCT 26.8*  --   --  23.2*  --   PLT 306  --   --  267  --   APTT  --  56*  --   --   --   HEPARINUNFRC  --  <0.10*  --   --  0.30  CREATININE 1.19  --  1.10  --   --     Estimated Creatinine Clearance: 125.5 mL/min (by C-G formula based on SCr of 1.1 mg/dL).   Assessment: 63 yom with HF EF < 20% admitted with cardiogenic shock and marked volume overload. Previously diagnosed with PE 0/07/23 but stopped his apixaban PTA due to hematochezia per patient. Pharmacy consulted to manage heparin while apixaban is on hold.  CBC initially wnl, Hgb now decreased from 8.5 > 7.6, platelets from 306 > 267. Per discussion with RN, no signs/symptoms bleeding or issues with heparin line. Heparin level now therapeutic at lower end 0.3. Will increase heparin infusion slightly to prevent from becoming subtherapeutic.  Goal of Therapy:  Heparin level 0.3-0.7 units/ml Monitor platelets by anticoagulation protocol: Yes   Plan:  Increase heparin infusion to 1750 units/hr Check heparin level in 6 hours and daily while on heparin Continue to monitor H&H and platelets    Thank you for allowing pharmacy to be a part of this patients care.  Ardyth Harps, PharmD Clinical Pharmacist

## 2021-09-02 NOTE — Progress Notes (Signed)
Mobility Specialist Progress Note    09/02/21 1401  Mobility  Bed Position Chair  Activity Ambulated independently in hallway  Level of Assistance Modified independent, requires aide device or extra time  Assistive Device  (IV pole)  Distance Ambulated (ft) 410 ft  Activity Response Tolerated fair  $Mobility charge 1 Mobility   Pre-Mobility: 110 HR, 94/60 BP, 99% SpO2 Post-Mobility: 114 HR, 95/60 BP, 99% SpO2  Pt received in chair and agreeable. C/o feeling high from his medication. Had successful void upon return. Left in chair with call bell in reach and family present.   [facility] Mobility Specialist  M.S. 2C and 6E: [phone] M.S. 4E: [phone]

## 2021-09-02 NOTE — Progress Notes (Signed)
PT Cancellation Note  Patient Details Name: Carlos Mckinney MRN: [id number] DOB: 1995/10/21   Cancelled Treatment:    Reason Eval/Treat Not Completed: Other (comment). Pt up in chair eating lunch. Will check back after lunch.   [place] 09/02/2021, 11:53 AM [place] Pager [phone] Office [phone]

## 2021-09-02 NOTE — Progress Notes (Signed)
ANTICOAGULATION CONSULT NOTE Pharmacy Consult for Heparin  Indication: pulmonary embolus  No Known Allergies  Patient Measurements: Weight: 105.9 kg (233 lb 6.4 oz)  Vital Signs: Temp: 98.9 F (37.2 C) (02/02 1937) Temp Source: Oral (02/02 1937) BP: 93/58 (02/02 2200) Pulse Rate: 111 (02/02 2200)  Labs: Recent Labs    09/01/21 1200 09/01/21 2315  HGB 8.5*  --   HCT 26.8*  --   PLT 306  --   APTT  --  56*  HEPARINUNFRC  --  <0.10*  CREATININE 1.19  --      Estimated Creatinine Clearance: 116.4 mL/min (by C-G formula based on SCr of 1.19 mg/dL).   Medical History: Past Medical History:  Diagnosis Date   Heart failure ([place])    Hypertension    Polysubstance abuse ([place])     Assessment: 25yom with HF EF < 20% admitted with soft BP and volume over load.   Previously diagnosed with PE earlier this month - stopped his apixaban on own.   Will start heparin drip until stable and procedures complete.  Initial heparin level < 0.10  Goal of Therapy:  Heparin level 0.3-0.7 units/ml Monitor platelets by anticoagulation protocol: Yes   Plan:  Heparin 3000 units iv bolus x  1 Heparin drip to 1700 uts/hr  Heparin level in 6 hours  Thank you Anette Guarneri, PharmD 09/02/2021 12:04 AM

## 2021-09-02 NOTE — Plan of Care (Signed)
  Problem: Education: Goal: Ability to demonstrate management of disease process will improve Outcome: Progressing Goal: Ability to verbalize understanding of medication therapies will improve Outcome: Progressing   Problem: Activity: Goal: Capacity to carry out activities will improve Outcome: Progressing   

## 2021-09-03 DIAGNOSIS — I5033 Acute on chronic diastolic (congestive) heart failure: Secondary | ICD-10-CM | POA: Diagnosis not present

## 2021-09-03 LAB — CBC
HCT: 24.4 % — ABNORMAL LOW (ref 39.0–52.0)
Hemoglobin: 7.8 g/dL — ABNORMAL LOW (ref 13.0–17.0)
MCH: 25.1 pg — ABNORMAL LOW (ref 26.0–34.0)
MCHC: 32 g/dL (ref 30.0–36.0)
MCV: 78.5 fL — ABNORMAL LOW (ref 80.0–100.0)
Platelets: 282 10*3/uL (ref 150–400)
RBC: 3.11 MIL/uL — ABNORMAL LOW (ref 4.22–5.81)
RDW: 21.5 % — ABNORMAL HIGH (ref 11.5–15.5)
WBC: 7.8 10*3/uL (ref 4.0–10.5)
nRBC: 0 % (ref 0.0–0.2)

## 2021-09-03 LAB — MAGNESIUM: Magnesium: 1.7 mg/dL (ref 1.7–2.4)

## 2021-09-03 LAB — COOXEMETRY PANEL
Carboxyhemoglobin: 2.5 % — ABNORMAL HIGH (ref 0.5–1.5)
Methemoglobin: 1.1 % (ref 0.0–1.5)
O2 Saturation: 62.7 %
Total hemoglobin: 7.2 g/dL — ABNORMAL LOW (ref 12.0–16.0)

## 2021-09-03 LAB — BASIC METABOLIC PANEL
Anion gap: 9 (ref 5–15)
BUN: 6 mg/dL (ref 6–20)
CO2: 28 mmol/L (ref 22–32)
Calcium: 8.4 mg/dL — ABNORMAL LOW (ref 8.9–10.3)
Chloride: 92 mmol/L — ABNORMAL LOW (ref 98–111)
Creatinine, Ser: 0.9 mg/dL (ref 0.61–1.24)
GFR, Estimated: >60 mL/min (ref >60)
Glucose, Bld: 102 mg/dL — ABNORMAL HIGH (ref 70–99)
Potassium: 3.2 mmol/L — ABNORMAL LOW (ref 3.5–5.1)
Sodium: 129 mmol/L — ABNORMAL LOW (ref 135–145)

## 2021-09-03 LAB — HEPARIN LEVEL (UNFRACTIONATED)
Heparin Unfractionated: 0.2 IU/mL — ABNORMAL LOW (ref 0.30–0.70)
Heparin Unfractionated: 0.35 IU/mL (ref 0.30–0.70)
Heparin Unfractionated: 0.52 IU/mL (ref 0.30–0.70)

## 2021-09-03 MED ORDER — POTASSIUM CHLORIDE CRYS ER 20 MEQ PO TBCR
40.0000 meq | EXTENDED_RELEASE_TABLET | Freq: Once | ORAL | Status: AC
Start: 2021-09-03 — End: 2021-09-03
  Administered 2021-09-03: 40 meq via ORAL
  Filled 2021-09-03: qty 2

## 2021-09-03 MED ORDER — HEPARIN BOLUS VIA INFUSION
2000.0000 [IU] | Freq: Once | INTRAVENOUS | Status: AC
Start: 2021-09-03 — End: 2021-09-03
  Administered 2021-09-03: 2000 [IU] via INTRAVENOUS
  Filled 2021-09-03: qty 2000

## 2021-09-03 MED ORDER — SPIRONOLACTONE 25 MG PO TABS
25.0000 mg | ORAL_TABLET | Freq: Every day | ORAL | Status: DC
  Administered 2021-09-04 – 2021-09-07 (×4): 25 mg via ORAL
  Filled 2021-09-03 (×4): qty 1

## 2021-09-03 MED ORDER — POTASSIUM CHLORIDE CRYS ER 10 MEQ PO TBCR
40.0000 meq | EXTENDED_RELEASE_TABLET | Freq: Two times a day (BID) | ORAL | Status: DC
  Administered 2021-09-03: 40 meq via ORAL
  Administered 2021-09-03: 10 meq via ORAL
  Filled 2021-09-03 (×5): qty 4

## 2021-09-03 MED ORDER — MAGNESIUM SULFATE 4 GM/100ML IV SOLN
4.0000 g | Freq: Once | INTRAVENOUS | Status: AC
  Administered 2021-09-03: 4 g via INTRAVENOUS
  Filled 2021-09-03: qty 100

## 2021-09-03 MED ORDER — EMPAGLIFLOZIN 10 MG PO TABS
10.0000 mg | ORAL_TABLET | Freq: Every day | ORAL | Status: DC
  Administered 2021-09-03 – 2021-09-07 (×5): 10 mg via ORAL
  Filled 2021-09-03 (×5): qty 1

## 2021-09-03 NOTE — Progress Notes (Signed)
Mobility Specialist Progress Note    09/03/21 1721  Mobility  Activity Refused mobility   Checked in on pt x2 and sleeping. Would grunt but not open eyes. Will f/u as schedule permits.   [facility] Mobility Specialist  M.S. 2C and 6E: [phone] M.S. 4E: [phone]

## 2021-09-03 NOTE — Progress Notes (Signed)
Carlos Mckinney for Heparin Indication: pulmonary embolus  No Known Allergies  Patient Measurements: Weight:  (refused weight/will call when he gets up to the bathroom to weigh)  Heparin Dosing Weight: 106 kg  Vital Signs: Temp: 98.4 F (36.9 C) (02/04 1539) Temp Source: Oral (02/04 1539) BP: 113/69 (02/04 1539) Pulse Rate: 98 (02/04 1539)  Labs: Recent Labs    09/01/21 1200 09/01/21 2315 09/02/21 0600 09/02/21 1154 09/02/21 1550 09/03/21 0000 09/03/21 0515 09/03/21 0921 09/03/21 1427  HGB 8.5*  --   --  7.6*  --   --  7.8*  --   --   HCT 26.8*  --   --  23.2*  --   --  24.4*  --   --   PLT 306  --   --  267  --   --  282  --   --   APTT  --  56*  --   --   --   --   --   --   --   HEPARINUNFRC  --  <0.10*  --   --    < > 0.20*  --  0.35 0.52  CREATININE 1.19  --  1.10  --   --   --  0.90  --   --    < > = values in this interval not displayed.     Estimated Creatinine Clearance: 152.1 mL/min (by C-G formula based on SCr of 0.9 mg/dL).   Assessment: 18 yom with HF EF < 20% admitted with cardiogenic shock and marked volume overload. Previously diagnosed with PE 07/23 but stopped his apixaban PTA due to hematochezia per patient. Pharmacy consulted to manage heparin while apixaban is on hold.   Patient will likely need LHC/RHC once diuresis is completed per HF team. Will continue heparin for now.   Heparin level remains therapeutic at 0.52 on 2000 units/hr of IV heparin. No s/x of bleeding per RN.  Goal of Therapy:  Heparin level 0.3-0.7 units/ml Monitor platelets by anticoagulation protocol: Yes   Plan:  Continue heparin at 2000 units/hr.  Monitor daily HL  Continue to monitor H&H and platelets F/u plans for continued diuresis and LHC/RHC    Thank you for allowing pharmacy to be a part of this patients care.  Albertina Parr, PharmD., BCCCP Clinical Pharmacist Please refer to [facility] for unit-specific pharmacist

## 2021-09-03 NOTE — Progress Notes (Signed)
Advanced Heart Failure Rounding Note  PCP-Cardiologist: None   Subjective:    Admitted with cardiogenic shock and marked volume overload. CO-OX 24%--> Started on milrinone 0.25 mcg. UDS + cocaine,THC   Now on milrinone 0.375  Co-ox 63%  Diuresing with IV lasix. Negative 4.3L No weight today  Breathing better. No orthopnea or PND  K 3.2 Na 129    Objective:   Weight Range: 105 kg Body mass index is 32.29 kg/m.   Vital Signs:   Temp:  [97.9 F (36.6 C)-99.2 F (37.3 C)] 98.3 F (36.8 C) (02/04 0800) Pulse Rate:  [99-114] 99 (02/04 0800) Resp:  [16-20] 16 (02/04 0800) BP: (93-119)/(64-87) 100/64 (02/04 0800) SpO2:  [94 %-100 %] 94 % (02/04 0800) Last BM Date: 08/31/21  Weight change: Filed Weights   09/01/21 1400 09/02/21 0525  Weight: 105.9 kg 105 kg    Intake/Output:   Intake/Output Summary (Last 24 hours) at 09/03/2021 1048 Last data filed at 09/03/2021 0915 Gross per 24 hour  Intake 2452.23 ml  Output 7950 ml  Net -5497.77 ml       Physical Exam    General:  Lying in bed . No resp difficulty HEENT: normal Neck: supple. JVP 8-9 Carotids 2+ bilat; no bruits. No lymphadenopathy or thryomegaly appreciated. Cor: PMI nondisplaced. Regular tachy  No rubs, gallops or murmurs. Lungs: clear Abdomen: soft, nontender, nondistended. No hepatosplenomegaly. No bruits or masses. Good bowel sounds. Extremities: no cyanosis, clubbing, rash, 1+ edema + TED Neuro: alert & orientedx3, cranial nerves grossly intact. moves all 4 extremities w/o difficulty. Affect pleasant   Telemetry   Sinus 100-110 Personally reviewed   Labs    CBC Recent Labs    09/02/21 1154 09/03/21 0515  WBC 7.7 7.8  HGB 7.6* 7.8*  HCT 23.2* 24.4*  MCV 77.6* 78.5*  PLT 267 [id number]    Basic Metabolic Panel Recent Labs    09/02/21 0600 09/03/21 0515  NA 128* 129*  K 3.6 3.2*  CL 98 92*  CO2 23 28  GLUCOSE 106* 102*  BUN 10 6  CREATININE 1.10 0.90  CALCIUM 8.2* 8.4*  MG 1.9 1.7     Liver Function Tests Recent Labs    09/01/21 1200  AST 27  ALT 26  ALKPHOS 69  BILITOT 2.6*  PROT 6.2*  ALBUMIN 2.8*    No results for input(s): LIPASE, AMYLASE in the last 72 hours. Cardiac Enzymes No results for input(s): CKTOTAL, CKMB, CKMBINDEX, TROPONINI in the last 72 hours.  BNP: BNP (last 3 results) Recent Labs    07/19/21 0046 08/06/21 1629 09/01/21 1200  BNP 2,693.0* 3,697.9* >4,500.0*     ProBNP (last 3 results) No results for input(s): PROBNP in the last 8760 hours.   D-Dimer No results for input(s): DDIMER in the last 72 hours. Hemoglobin A1C No results for input(s): HGBA1C in the last 72 hours. Fasting Lipid Panel No results for input(s): CHOL, HDL, LDLCALC, TRIG, CHOLHDL, LDLDIRECT in the last 72 hours. Thyroid Function Tests No results for input(s): TSH, T4TOTAL, T3FREE, THYROIDAB in the last 72 hours.  Invalid input(s): FREET3  Other results:   Imaging    ECHOCARDIOGRAM COMPLETE  Result Date: 09/02/2021    ECHOCARDIOGRAM REPORT   Patient Name:   Carlos Mckinney Date of Exam: 09/02/2021 Medical Rec #:  [id number]       Height:       71.0 in Accession #:    [id number]      Weight:  231.5 lb Date of Birth:  1996-02-19        BSA:          2.244 m Patient Age:    26 years        BP:           93/68 mmHg Patient Gender: M               HR:           113 bpm. Exam Location:  Inpatient Procedure: 2D Echo, Cardiac Doppler, Color Doppler and Intracardiac            Opacification Agent Indications:    CHF-Acute Systolic  History:        Patient has prior history of Echocardiogram examinations, most                 recent 08/07/2021. CHF; Risk Factors:Hypertension. Substance                 abuse.  Sonographer:    Clayton Lefort RDCS (AE) Referring Phys: [place]  1. No apical mural thrombus . Left ventricular ejection fraction, by estimation, is <20%. The left ventricle has severely decreased function. The left ventricle  demonstrates global hypokinesis. The left ventricular internal cavity size was severely dilated. Left ventricular diastolic parameters are indeterminate.  2. Right ventricular systolic function is moderately reduced. The right ventricular size is normal. There is moderately elevated pulmonary artery systolic pressure.  3. Left atrial size was moderately dilated.  4. The pericardial effusion is posterior to the left ventricle and localized near the right atrium.  5. The mitral valve is normal in structure. Trivial mitral valve regurgitation. No evidence of mitral stenosis.  6. Tricuspid valve regurgitation is severe.  7. The aortic valve is tricuspid. Aortic valve regurgitation is not visualized. No aortic stenosis is present.  8. The inferior vena cava is dilated in size with >50% respiratory variability, suggesting right atrial pressure of 8 mmHg. FINDINGS  Left Ventricle: No apical mural thrombus. Left ventricular ejection fraction, by estimation, is <20%. The left ventricle has severely decreased function. The left ventricle demonstrates global hypokinesis. Definity contrast agent was given IV to delineate the left ventricular endocardial borders. The left ventricular internal cavity size was severely dilated. There is no left ventricular hypertrophy. Left ventricular diastolic parameters are indeterminate. Right Ventricle: The right ventricular size is normal. Right vetricular wall thickness was not assessed. Right ventricular systolic function is moderately reduced. There is moderately elevated pulmonary artery systolic pressure. The tricuspid regurgitant  velocity is 3.26 m/s, and with an assumed right atrial pressure of 15 mmHg, the estimated right ventricular systolic pressure is [id number] mmHg. Left Atrium: Left atrial size was moderately dilated. Right Atrium: Right atrial size was normal in size. Pericardium: Trivial pericardial effusion is present. The pericardial effusion is posterior to the left ventricle  and localized near the right atrium. Mitral Valve: The mitral valve is normal in structure. Trivial mitral valve regurgitation. No evidence of mitral valve stenosis. Tricuspid Valve: The tricuspid valve is normal in structure. Tricuspid valve regurgitation is severe. No evidence of tricuspid stenosis. Aortic Valve: The aortic valve is tricuspid. Aortic valve regurgitation is not visualized. No aortic stenosis is present. Aortic valve mean gradient measures 3.0 mmHg. Aortic valve peak gradient measures 4.8 mmHg. Aortic valve area, by VTI measures 2.97 cm. Pulmonic Valve: The pulmonic valve was normal in structure. Pulmonic valve regurgitation is mild. No evidence of pulmonic stenosis. Aorta:  The aortic root is normal in size and structure. Venous: The inferior vena cava is dilated in size with greater than 50% respiratory variability, suggesting right atrial pressure of 8 mmHg. IAS/Shunts: No atrial level shunt detected by color flow Doppler.  LEFT VENTRICLE PLAX 2D LVIDd:         8.40 cm LVIDs:         7.30 cm LV PW:         1.10 cm LV IVS:        0.70 cm LVOT diam:     2.10 cm LV SV:         42 LV SV Index:   19 LVOT Area:     3.46 cm  RIGHT VENTRICLE            IVC RV Basal diam:  4.30 cm    IVC diam: 2.60 cm RV Mid diam:    2.00 cm RV S prime:     9.36 cm/s TAPSE (M-mode): 1.6 cm LEFT ATRIUM             Index        RIGHT ATRIUM           Index LA diam:        5.20 cm 2.32 cm/m   RA Area:     26.30 cm LA Vol (A2C):   54.6 ml 24.33 ml/m  RA Volume:   90.70 ml  40.42 ml/m LA Vol (A4C):   87.0 ml 38.77 ml/m LA Biplane Vol: 72.5 ml 32.31 ml/m  AORTIC VALVE AV Area (Vmax):    2.76 cm AV Area (Vmean):   2.35 cm AV Area (VTI):     2.97 cm AV Vmax:           109.00 cm/s AV Vmean:          82.700 cm/s AV VTI:            0.141 m AV Peak Grad:      4.8 mmHg AV Mean Grad:      3.0 mmHg LVOT Vmax:         87.00 cm/s LVOT Vmean:        56.000 cm/s LVOT VTI:          0.121 m LVOT/AV VTI ratio: 0.86  AORTA Ao Root  diam: 2.60 cm Ao Asc diam:  2.70 cm TRICUSPID VALVE TR Peak grad:   42.5 mmHg TR Vmax:        326.00 cm/s  SHUNTS Systemic VTI:  0.12 m Systemic Diam: 2.10 cm Jenkins Rouge MD Electronically signed by Jenkins Rouge MD Signature Date/Time: 09/02/2021/6:05:52 PM    Final      Medications:     Scheduled Medications:  Chlorhexidine Gluconate Cloth  6 each Topical Daily   digoxin  0.125 mg Oral Daily   furosemide  80 mg Intravenous BID   senna-docusate  1 tablet Oral QHS   sodium chloride flush  10-40 mL Intracatheter Q12H   spironolactone  12.5 mg Oral Daily    Infusions:  heparin 2,000 Units/hr (09/03/21 0908)   milrinone 0.375 mcg/kg/min (09/03/21 0915)    PRN Medications: acetaminophen, guaiFENesin-dextromethorphan    Patient Profile   26 y/o male with h/o drug abuse and severe systolic HF presented to clinic today with 2 weeks of worsening HF.   Admitted with cardiogenic shock and marked volume overload.   Assessment/Plan   1. 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 Admit for a/c HF 08/22.  - Echo (8/22): EF 15-20%, mildly dilated RV with normal RV function, mild MR, moderate TR, PAP 40-45 mmHg - Echo 08/07/21 EF < 20%.  - Etiology not certain. ? cocaine use, viral myocarditis or LV noncompaction. Father has heart issues and maternal cousin has systolic HF. Ischemic not likely at his age. LVH on ECG and h/o HTN, ? component of HTN CM.  - Initial CO-OX 24% --> 51% -> 63% On milrinone 0.375 mcg.   - CVP 8-9. Continue IV lasix - Hold MRA/ARNi/BB with low BP - Continue Digoxin 0.125 mg - Increase spiro to 25 - Add SGLT - Needs R/LHC after diuresis if he stays  - cMRI if cath unrevealing  - Not a candidate for advanced therapies with polysubstance abuse.  - Long discussion about high-risk nature of his situation and need for cessation of substance and to cooperate with treatment teams    2. . H/o  Snoring: - Sleep study once has insurance.   3 . Substance Use: - cocaine/ETOH and tobacco use history  - Counseled on quitting. - UDS on admit -->+ cocaine  + THC  4. Hypokalemia/hypomag - supp - Increase spiro   5. PE/ Rt LE DVT - diagnosed 08/06/21, placed on Eliquis - pt self discontinue Eliquis ~1 wk ago due to hematochezia. Denies further bleeding in the last 2 days - heparin per pharmacy.   7. Recent Hematochezia/ Iron Deficiency Anemia  - felt 2/2 hemorrhoids. Noted previous admit 1/23. Hgb 8.7 w/ low iron stores - per above, start heparin gtt for PE/DVT - Iron sats low. Given dose of feraheme on 2/3   Length of Stay: 2  Glori Bickers, MD  09/03/2021, 10:48 AM  Advanced Heart Failure Team Pager [phone] (M-F; 7a - 5p)  Please contact [facility] for night-coverage after hours (5p -7a ) and weekends on [email or web address]

## 2021-09-03 NOTE — Progress Notes (Signed)
[place] for Heparin Indication: pulmonary embolus  No Known Allergies  Patient Measurements: Weight:  (refused weight/will call when he gets up to the bathroom to weigh)  Heparin Dosing Weight: 106 kg  Vital Signs: Temp: 98.3 F (36.8 C) (02/04 0800) Temp Source: Oral (02/04 0800) BP: 100/64 (02/04 0800) Pulse Rate: 99 (02/04 0800)  Labs: Recent Labs    09/01/21 1200 09/01/21 1200 09/01/21 2315 09/02/21 0600 09/02/21 1154 09/02/21 1550 09/03/21 0000 09/03/21 0515 09/03/21 0921  HGB 8.5*  --   --   --  7.6*  --   --  7.8*  --   HCT 26.8*  --   --   --  23.2*  --   --  24.4*  --   PLT 306  --   --   --  267  --   --  282  --   APTT  --   --  56*  --   --   --   --   --   --   HEPARINUNFRC  --    < > <0.10*  --   --  0.30 0.20*  --  0.35  CREATININE 1.19  --   --  1.10  --   --   --  0.90  --    < > = values in this interval not displayed.     Estimated Creatinine Clearance: 152.1 mL/min (by C-G formula based on SCr of 0.9 mg/dL).   Assessment: 42 yom with HF EF < 20% admitted with cardiogenic shock and marked volume overload. Previously diagnosed with PE 07/23 but stopped his apixaban PTA due to hematochezia per patient. Pharmacy consulted to manage heparin while apixaban is on hold.   Patient will likely need LHC/RHC once diuresis is completed per HF team. Will continue heparin for now.   Heparin level now therapeutic at lower end at 0.35 at 2000 units/hr. Hgb low but stable at 7.8 platelets stable at 282. No s/x of bleeding per RN.  Goal of Therapy:  Heparin level 0.3-0.7 units/ml Monitor platelets by anticoagulation protocol: Yes   Plan:  Continue heparin at 2000 units/hr.  Check heparin level in 6 hours and daily while on heparin Continue to monitor H&H and platelets F/u plans for continued diuresis and LHC/RHC    Thank you for allowing pharmacy to be a part of this patients care.  Cathrine Muster, PharmD PGY2  Cardiology Pharmacy Resident Phone: [phone] 09/03/2021  10:55 AM  Please check [email or web address] for unit-specific pharmacy phone numbers.

## 2021-09-03 NOTE — Progress Notes (Signed)
Mobility Specialist Progress Note    09/03/21 1354  Mobility  Activity Refused mobility   Pt stated he did not feel like walking and MD told him he did not have to. Will f/u in a few hours.   [facility] Mobility Specialist  M.S. 2C and 6E: [phone] M.S. 4E: [phone]

## 2021-09-03 NOTE — Progress Notes (Signed)
Occupational Therapy Treatment/Discharge Patient Details Name: Carlos Mckinney MRN: [id number] DOB: Jan 13, 1996 Today's Date: 09/03/2021   History of present illness 26 y/o male w/ h/o chronic systolic heart failure of uncertain etiology, polysubstance use and poor compliance, direct admitted from clinic for a/c CHF w/ marked fluid overload, NYHA Class IIIb symptoms and concern for low output HF.   OT comments  Session focused on reinforcement of education re: energy conservation, strategies to prevent CHF exacerbation with emphasis on tracking intake/output. Provided handouts including intake/output sheet but also showed options for free apps to download/track intake/output more easily. Pt with flat affect though receptive to education, denies any concerns/questions at this time. Encouraged elevation and continued mobility during admission. OT to sign off as all goals met and no further needs identified.   Recommendations for follow up therapy are one component of a multi-disciplinary discharge planning process, led by the attending physician.  Recommendations may be updated based on patient status, additional functional criteria and insurance authorization.    Follow Up Recommendations  No OT follow up    Assistance Recommended at Discharge None  Patient can return home with the following      Equipment Recommendations  None recommended by OT    Recommendations for Other Services      Precautions / Restrictions Precautions Precautions: Fall Precaution Comments: low fall Restrictions Weight Bearing Restrictions: No       Mobility Bed Mobility Overal bed mobility: Modified Independent                  Transfers Overall transfer level: Independent Equipment used: None                     Balance Overall balance assessment: No apparent balance deficits (not formally assessed)                                         ADL either performed or  assessed with clinical judgement   ADL Overall ADL's : Modified independent                                       General ADL Comments: stood at bedside for urinal use    Extremity/Trunk Assessment Upper Extremity Assessment Upper Extremity Assessment: Overall WFL for tasks assessed   Lower Extremity Assessment Lower Extremity Assessment: Overall WFL for tasks assessed        Vision   Vision Assessment?: No apparent visual deficits   Perception     Praxis      Cognition Arousal/Alertness: Awake/alert Behavior During Therapy: WFL for tasks assessed/performed Overall Cognitive Status: Within Functional Limits for tasks assessed                                 General Comments: decreased awareness of significance of diagnosis and importance of full compliance        Exercises      Shoulder Instructions       General Comments Pt girlfriend at bedside. Emphasis on energy conservation education, CHF & Self care, and handout for intake/output tracking. Also educated on locating free app (showed examples) for pt to look at and trial as this may improve compliance when using phone  Pertinent Vitals/ Pain       Pain Assessment Pain Assessment: No/denies pain  Home Living                                          Prior Functioning/Environment              Frequency  Min 1X/week        Progress Toward Goals  OT Goals(current goals can now be found in the care plan section)  Progress towards OT goals: Goals met/education completed, patient discharged from OT  Acute Rehab OT Goals Patient Stated Goal: none stated today OT Goal Formulation: With patient Time For Goal Achievement: 09/16/21 Potential to Achieve Goals: Fair ADL Goals Additional ADL Goal #1: Pt will demonstrate independence with 3 energy conservation strategies during IADL to maintain HR WNL. Additional ADL Goal #2: Pt will demonstrate  understanding and independence with monitoring input/output to reduce risk for re-admission.  Plan Discharge plan remains appropriate;All goals met and education completed, patient discharged from OT services    Co-evaluation                 AM-PAC OT "6 Clicks" Daily Activity     Outcome Measure   Help from another person eating meals?: None Help from another person taking care of personal grooming?: None Help from another person toileting, which includes using toliet, bedpan, or urinal?: None Help from another person bathing (including washing, rinsing, drying)?: None Help from another person to put on and taking off regular upper body clothing?: None Help from another person to put on and taking off regular lower body clothing?: None 6 Click Score: 24    End of Session    OT Visit Diagnosis: Other abnormalities of gait and mobility (R26.89)   Activity Tolerance Patient tolerated treatment well   Patient Left in bed;with call bell/phone within reach;with family/visitor present   Nurse Communication Mobility status        Time: 1216-1228 OT Time Calculation (min): 12 min  Charges: OT General Charges $OT Visit: 1 Visit OT Treatments $Self Care/Home Management : 8-22 mins  Malachy Chamber, OTR/L Acute Rehab Services Office: [phone]   Layla Maw 09/03/2021, 12:42 PM

## 2021-09-03 NOTE — Progress Notes (Signed)
ANTICOAGULATION CONSULT NOTE - Follow Up Consult  Pharmacy Consult for heparin Indication: pulmonary embolus  Labs: Recent Labs    09/01/21 1200 09/01/21 2315 09/02/21 0600 09/02/21 1154 09/02/21 1550 09/03/21 0000  HGB 8.5*  --   --  7.6*  --   --   HCT 26.8*  --   --  23.2*  --   --   PLT 306  --   --  267  --   --   APTT  --  56*  --   --   --   --   HEPARINUNFRC  --  <0.10*  --   --  0.30 0.20*  CREATININE 1.19  --  1.10  --   --   --     Assessment: 26yo male subtherapeutic on heparin with lower heparin level despite increased rate; no infusion issues or signs of bleeding per RN.  Goal of Therapy:  Heparin level 0.3-0.7 units/ml   Plan:  Will give heparin 2000 units IV bolus and increase heparin infusion by 2 units/kg/hr to 2000 units/hr and check level in 6 hours.    Wynona Neat, PharmD, BCPS  09/03/2021,1:27 AM

## 2021-09-04 DIAGNOSIS — I5033 Acute on chronic diastolic (congestive) heart failure: Secondary | ICD-10-CM | POA: Diagnosis not present

## 2021-09-04 LAB — CBC
HCT: 26 % — ABNORMAL LOW (ref 39.0–52.0)
Hemoglobin: 8.2 g/dL — ABNORMAL LOW (ref 13.0–17.0)
MCH: 24.5 pg — ABNORMAL LOW (ref 26.0–34.0)
MCHC: 31.5 g/dL (ref 30.0–36.0)
MCV: 77.6 fL — ABNORMAL LOW (ref 80.0–100.0)
Platelets: 308 10*3/uL (ref 150–400)
RBC: 3.35 MIL/uL — ABNORMAL LOW (ref 4.22–5.81)
RDW: 21.5 % — ABNORMAL HIGH (ref 11.5–15.5)
WBC: 7.9 10*3/uL (ref 4.0–10.5)
nRBC: 0 % (ref 0.0–0.2)

## 2021-09-04 LAB — COOXEMETRY PANEL
Carboxyhemoglobin: 2.8 % — ABNORMAL HIGH (ref 0.5–1.5)
Methemoglobin: 1 % (ref 0.0–1.5)
O2 Saturation: 60.9 %
Total hemoglobin: 8.1 g/dL — ABNORMAL LOW (ref 12.0–16.0)

## 2021-09-04 LAB — HEMOGLOBIN A1C
Hgb A1c MFr Bld: 4.5 % — ABNORMAL LOW (ref 4.8–5.6)
Mean Plasma Glucose: 82.45 mg/dL

## 2021-09-04 LAB — MAGNESIUM: Magnesium: 2 mg/dL (ref 1.7–2.4)

## 2021-09-04 LAB — BASIC METABOLIC PANEL
Anion gap: 8 (ref 5–15)
BUN: <5 mg/dL — ABNORMAL LOW (ref 6–20)
CO2: 31 mmol/L (ref 22–32)
Calcium: 8.6 mg/dL — ABNORMAL LOW (ref 8.9–10.3)
Chloride: 90 mmol/L — ABNORMAL LOW (ref 98–111)
Creatinine, Ser: 1.06 mg/dL (ref 0.61–1.24)
GFR, Estimated: >60 mL/min (ref >60)
Glucose, Bld: 134 mg/dL — ABNORMAL HIGH (ref 70–99)
Potassium: 3.2 mmol/L — ABNORMAL LOW (ref 3.5–5.1)
Sodium: 129 mmol/L — ABNORMAL LOW (ref 135–145)

## 2021-09-04 LAB — HEPARIN LEVEL (UNFRACTIONATED): Heparin Unfractionated: 0.4 IU/mL (ref 0.30–0.70)

## 2021-09-04 MED ORDER — POTASSIUM CHLORIDE 20 MEQ PO PACK
40.0000 meq | PACK | Freq: Two times a day (BID) | ORAL | Status: DC
  Administered 2021-09-04 – 2021-09-07 (×6): 40 meq via ORAL
  Filled 2021-09-04 (×6): qty 2

## 2021-09-04 MED ORDER — SODIUM CHLORIDE 0.9% FLUSH: 3.0000 mL | INTRAVENOUS | Status: DC | PRN

## 2021-09-04 MED ORDER — ASPIRIN 81 MG PO CHEW: 81.0000 mg | CHEWABLE_TABLET | ORAL | Status: AC

## 2021-09-04 MED ORDER — SODIUM CHLORIDE 0.9% FLUSH
3.0000 mL | Freq: Two times a day (BID) | INTRAVENOUS | Status: DC
  Administered 2021-09-04 – 2021-09-06 (×5): 3 mL via INTRAVENOUS

## 2021-09-04 MED ORDER — SODIUM CHLORIDE 0.9 % IV SOLN: INTRAVENOUS | Status: DC

## 2021-09-04 MED ORDER — SODIUM CHLORIDE 0.9 % IV SOLN: 250.0000 mL | INTRAVENOUS | Status: DC | PRN

## 2021-09-04 NOTE — Plan of Care (Signed)
°  Problem: Education: Goal: Ability to verbalize understanding of medication therapies will improve Outcome: Progressing Goal: Individualized Educational Video(s) Outcome: Progressing   Problem: Education: Goal: Individualized Educational Video(s) Outcome: Progressing   Problem: Education: Goal: Knowledge of General Education information will improve Description: Including pain rating scale, medication(s)/side effects and non-pharmacologic comfort measures Outcome: Progressing   Problem: Clinical Measurements: Goal: Ability to maintain clinical measurements within normal limits will improve Outcome: Progressing Goal: Will remain free from infection Outcome: Progressing Goal: Diagnostic test results will improve Outcome: Progressing Goal: Respiratory complications will improve Outcome: Progressing Goal: Cardiovascular complication will be avoided Outcome: Progressing   Problem: Nutrition: Goal: Adequate nutrition will be maintained Outcome: Progressing   Problem: Coping: Goal: Level of anxiety will decrease Outcome: Progressing   Problem: Elimination: Goal: Will not experience complications related to bowel motility Outcome: Progressing Goal: Will not experience complications related to urinary retention Outcome: Progressing   Problem: Safety: Goal: Ability to remain free from injury will improve Outcome: Progressing   Problem: Skin Integrity: Goal: Risk for impaired skin integrity will decrease Outcome: Progressing   Problem: Education: Goal: Ability to demonstrate management of disease process will improve Outcome: Not Progressing   Problem: Activity: Goal: Capacity to carry out activities will improve Outcome: Not Progressing   Problem: Cardiac: Goal: Ability to achieve and maintain adequate cardiopulmonary perfusion will improve Outcome: Not Progressing   Problem: Education: Goal: Ability to demonstrate management of disease process will  improve Outcome: Not Progressing Goal: Ability to verbalize understanding of medication therapies will improve Outcome: Not Progressing   Problem: Activity: Goal: Capacity to carry out activities will improve Outcome: Not Progressing   Problem: Cardiac: Goal: Ability to achieve and maintain adequate cardiopulmonary perfusion will improve Outcome: Not Progressing   Problem: Health Behavior/Discharge Planning: Goal: Ability to manage health-related needs will improve Outcome: Not Progressing   Problem: Activity: Goal: Risk for activity intolerance will decrease Outcome: Not Progressing   Problem: Pain Managment: Goal: General experience of comfort will improve Outcome: Not Progressing

## 2021-09-04 NOTE — Progress Notes (Signed)
Pt refused to take potassium oral, he was missing dose from last night too, appreciate pharmacy to switch it to [employer]

## 2021-09-04 NOTE — Progress Notes (Signed)
ANTICOAGULATION CONSULT NOTE  Pharmacy Consult for Heparin Indication: pulmonary embolus  No Known Allergies  Patient Measurements: Height: 5\' 11"  (180.3 cm) Weight: 94 kg (207 lb 3.7 oz) IBW/kg (Calculated) : 75.3  Heparin Dosing Weight: 106 kg  Vital Signs: Temp: 98.6 F (37 C) (02/05 0802) Temp Source: Oral (02/05 0802) BP: 112/87 (02/05 0802) Pulse Rate: 94 (02/05 0802)  Labs: Recent Labs    09/01/21 2315 09/02/21 0600 09/02/21 1154 09/02/21 1550 09/03/21 0515 09/03/21 0921 09/03/21 1427 09/04/21 0432 09/04/21 0619  HGB  --   --  7.6*  --  7.8*  --   --  8.2*  --   HCT  --   --  23.2*  --  24.4*  --   --  26.0*  --   PLT  --   --  267  --  282  --   --  308  --   APTT 56*  --   --   --   --   --   --   --   --   HEPARINUNFRC <0.10*  --   --    < >  --  0.35 0.52  --  0.40  CREATININE  --  1.10  --   --  0.90  --   --  1.06  --    < > = values in this interval not displayed.     Estimated Creatinine Clearance: 122.6 mL/min (by C-G formula based on SCr of 1.06 mg/dL).   Assessment: 60 yom with HF EF < 20% admitted with cardiogenic shock and marked volume overload. Previously diagnosed with PE 07/23 but stopped his apixaban PTA due to hematochezia per patient. Pharmacy consulted to manage heparin while apixaban is on hold.   Patient will likely need LHC/RHC once diuresis is completed per HF team. Will continue heparin for now.   Heparin level remains therapeutic at 0.4 on 2000 units/hr of IV heparin. No s/x of bleeding per RN. Hemoglobin improved from 7.8 to 8.2 and platelets stable at 308.  Goal of Therapy:  Heparin level 0.3-0.7 units/ml Monitor platelets by anticoagulation protocol: Yes   Plan:  Continue heparin at 2000 units/hr.  Monitor daily HL  Continue to monitor H&H and platelets F/u plans for continued diuresis and LHC/RHC    Thank you for allowing pharmacy to be a part of this patients care.  Cathrine Muster, PharmD PGY2 Cardiology  Pharmacy Resident Phone: [phone] 09/04/2021  8:25 AM  Please check [email or web address] for unit-specific pharmacy phone numbers.

## 2021-09-04 NOTE — Progress Notes (Signed)
Mobility Specialist Progress Note    09/04/21 1713  Mobility  Activity Ambulated independently in hallway  Level of Assistance Modified independent, requires aide device or extra time  Assistive Device  (IV pole)  Distance Ambulated (ft) 410 ft  Activity Response Tolerated fair  $Mobility charge 1 Mobility   Post-Mobility: 124 HR, 107/78 BP  Pt received in bed and eventually agreed. C/o lightheadedness, dizziness, some blurry vision and said he couldn't explain how but he was feeling funny but wanted to keep walking. Returned to sitting EOB with call bell in reach.   [facility] Mobility Specialist  M.S. 2C and 6E: [phone] M.S. 4E: [phone]

## 2021-09-04 NOTE — Progress Notes (Signed)
Advanced Heart Failure Rounding Note  PCP-Cardiologist: None   Subjective:    Admitted with cardiogenic shock and marked volume overload. CO-OX 24%--> Started on milrinone 0.25 mcg. UDS + cocaine,THC   Now on milrinone 0.375  Co-ox 61%  Massive diuresis with IV lasix. Over 15L out in past 2 days. Weight down 26 pounds. Breathing better. Denies orthopnea or PND. Ribs still sore from coughing  CVP 7-8  Na 129 K 3.2  Scr 1.06   Objective:   Weight Range: 94 kg Body mass index is 28.9 kg/m.   Vital Signs:   Temp:  [98.4 F (36.9 C)-98.8 F (37.1 C)] 98.6 F (37 C) (02/05 0802) Pulse Rate:  [94-105] 94 (02/05 0802) Resp:  [14-20] 17 (02/05 0802) BP: (98-115)/(49-87) 112/87 (02/05 0802) SpO2:  [95 %-99 %] 99 % (02/05 0434) Weight:  [94 kg] 94 kg (02/05 0441) Last BM Date: 08/31/21  Weight change: Filed Weights   09/01/21 1400 09/02/21 0525 09/04/21 0441  Weight: 105.9 kg 105 kg 94 kg    Intake/Output:   Intake/Output Summary (Last 24 hours) at 09/04/2021 0851 Last data filed at 09/04/2021 0438 Gross per 24 hour  Intake 2160 ml  Output 7600 ml  Net -5440 ml       Physical Exam    General:  Lying in bed No resp difficulty HEENT: normal Neck: supple. JVP 7-8 . Carotids 2+ bilat; no bruits. No lymphadenopathy or thryomegaly appreciated. Cor: Regular tachy  No rubs, gallops or murmurs. Lungs: clear Abdomen: soft, nontender, nondistended. No hepatosplenomegaly. No bruits or masses. Good bowel sounds. Extremities: no cyanosis, clubbing, rash, edema Neuro: alert & orientedx3, cranial nerves grossly intact. moves all 4 extremities w/o difficulty. Affect pleasant   Telemetry   Sinus 90-110 Personally reviewed   Labs    CBC Recent Labs    09/03/21 0515 09/04/21 0432  WBC 7.8 7.9  HGB 7.8* 8.2*  HCT 24.4* 26.0*  MCV 78.5* 77.6*  PLT 282 [id number]    Basic Metabolic Panel Recent Labs    09/03/21 0515 09/04/21 0432  NA 129* 129*  K 3.2* 3.2*  CL 92*  90*  CO2 28 31  GLUCOSE 102* 134*  BUN 6 <5*  CREATININE 0.90 1.06  CALCIUM 8.4* 8.6*  MG 1.7 2.0    Liver Function Tests Recent Labs    09/01/21 1200  AST 27  ALT 26  ALKPHOS 69  BILITOT 2.6*  PROT 6.2*  ALBUMIN 2.8*    No results for input(s): LIPASE, AMYLASE in the last 72 hours. Cardiac Enzymes No results for input(s): CKTOTAL, CKMB, CKMBINDEX, TROPONINI in the last 72 hours.  BNP: BNP (last 3 results) Recent Labs    07/19/21 0046 08/06/21 1629 09/01/21 1200  BNP 2,693.0* 3,697.9* >4,500.0*     ProBNP (last 3 results) No results for input(s): PROBNP in the last 8760 hours.   D-Dimer No results for input(s): DDIMER in the last 72 hours. Hemoglobin A1C Recent Labs    09/04/21 0447  HGBA1C 4.5*   Fasting Lipid Panel No results for input(s): CHOL, HDL, LDLCALC, TRIG, CHOLHDL, LDLDIRECT in the last 72 hours. Thyroid Function Tests No results for input(s): TSH, T4TOTAL, T3FREE, THYROIDAB in the last 72 hours.  Invalid input(s): FREET3  Other results:   Imaging    No results found.   Medications:     Scheduled Medications:  Chlorhexidine Gluconate Cloth  6 each Topical Daily   digoxin  0.125 mg Oral Daily   empagliflozin  10 mg Oral Daily   furosemide  80 mg Intravenous BID   potassium chloride  40 mEq Oral BID   senna-docusate  1 tablet Oral QHS   sodium chloride flush  10-40 mL Intracatheter Q12H   spironolactone  25 mg Oral Daily    Infusions:  heparin 2,000 Units/hr (09/03/21 2155)   milrinone 0.375 mcg/kg/min (09/04/21 0316)    PRN Medications: acetaminophen, guaiFENesin-dextromethorphan    Patient Profile   26 y/o male with h/o drug abuse and severe systolic HF presented to clinic today with 2 weeks of worsening HF.   Admitted with cardiogenic shock and marked volume overload.   Assessment/Plan   1. 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 Admit  for a/c HF 08/22.  - Echo (8/22): EF 15-20%, mildly dilated RV with normal RV function, mild MR, moderate TR, PAP 40-45 mmHg - Echo 08/07/21 EF < 20%.  - Etiology not certain. ? cocaine use, viral myocarditis or LV noncompaction. Father has heart issues and maternal cousin has systolic HF. Ischemic not likely at his age. LVH on ECG and h/o HTN, ? component of HTN CM.  - Initial CO-OX 24% --> 51% -> 63% -> 60%On milrinone 0.375 mcg.   - Massive diuresis over past 2 days weight down 26 pounds. Appears euvolemic - CVP 7-8 Can stop IV lasix after next dose - Hold MRA/ARNi/BB with low BP - Continue digoxin 0.125 mg - Continue spiro  25 - Continue Jardiance - R/LHC tomorrow + cMRI - Not a candidate for advanced therapies with polysubstance abuse.  - Long discussion about high-risk nature of his situation and need for cessation of substance and to cooperate with treatment teams    2. . H/o Snoring: - Sleep study once has insurance.   3 . Substance Use: - cocaine/ETOH and tobacco use history  - Counseled on quitting. - UDS on admit -->+ cocaine  + THC  4. Hypokalemia/hypomag/hyponatremia - supp K - free water restric   5. PE/ RLE DVT - diagnosed 08/06/21, placed on Eliquis - pt self discontinue Eliquis ~1 wk ago due to hematochezia. Denies further bleeding in the last 2 days - heparin per pharmacy. No evidence of bleeding  7. Recent Hematochezia/ Iron Deficiency Anemia  - felt 2/2 hemorrhoids. Noted previous admit 1/23. Hgb 8.2 w/ low iron stores - Iron sats low. Given dose of feraheme on 2/3 - No evidence of rebleeding   Length of Stay: 3  Glori Bickers, MD  09/04/2021, 8:51 AM  Advanced Heart Failure Team Pager [phone] (M-F; 7a - 5p)  Please contact [facility] for night-coverage after hours (5p -7a ) and weekends on [email or web address]

## 2021-09-05 ENCOUNTER — Encounter (HOSPITAL_COMMUNITY)
Admission: AD | Disposition: A | Discharge status: Home / Self Care | Payer: Self-pay | Source: Ambulatory Visit | Attending: Internal Medicine

## 2021-09-05 ENCOUNTER — Inpatient Hospital Stay (HOSPITAL_COMMUNITY): Payer: Medicaid Other

## 2021-09-05 ENCOUNTER — Other Ambulatory Visit (HOSPITAL_COMMUNITY): Payer: Self-pay

## 2021-09-05 DIAGNOSIS — I5033 Acute on chronic diastolic (congestive) heart failure: Secondary | ICD-10-CM

## 2021-09-05 LAB — BASIC METABOLIC PANEL
Anion gap: 9 (ref 5–15)
BUN: <5 mg/dL — ABNORMAL LOW (ref 6–20)
CO2: 32 mmol/L (ref 22–32)
Calcium: 8.8 mg/dL — ABNORMAL LOW (ref 8.9–10.3)
Chloride: 89 mmol/L — ABNORMAL LOW (ref 98–111)
Creatinine, Ser: 1.02 mg/dL (ref 0.61–1.24)
GFR, Estimated: >60 mL/min (ref >60)
Glucose, Bld: 159 mg/dL — ABNORMAL HIGH (ref 70–99)
Potassium: 3.2 mmol/L — ABNORMAL LOW (ref 3.5–5.1)
Sodium: 130 mmol/L — ABNORMAL LOW (ref 135–145)

## 2021-09-05 LAB — HEPARIN LEVEL (UNFRACTIONATED): Heparin Unfractionated: 0.39 IU/mL (ref 0.30–0.70)

## 2021-09-05 LAB — COOXEMETRY PANEL
Carboxyhemoglobin: 2.4 % — ABNORMAL HIGH (ref 0.5–1.5)
Methemoglobin: 0.8 % (ref 0.0–1.5)
O2 Saturation: 66.1 %
Total hemoglobin: 10.1 g/dL — ABNORMAL LOW (ref 12.0–16.0)

## 2021-09-05 LAB — CBC
HCT: 27 % — ABNORMAL LOW (ref 39.0–52.0)
Hemoglobin: 8.4 g/dL — ABNORMAL LOW (ref 13.0–17.0)
MCH: 24.3 pg — ABNORMAL LOW (ref 26.0–34.0)
MCHC: 31.1 g/dL (ref 30.0–36.0)
MCV: 78.3 fL — ABNORMAL LOW (ref 80.0–100.0)
Platelets: 367 10*3/uL (ref 150–400)
RBC: 3.45 MIL/uL — ABNORMAL LOW (ref 4.22–5.81)
RDW: 21.7 % — ABNORMAL HIGH (ref 11.5–15.5)
WBC: 8.4 10*3/uL (ref 4.0–10.5)
nRBC: 0.2 % (ref 0.0–0.2)

## 2021-09-05 LAB — DIGOXIN LEVEL: Digoxin Level: <0.2 ng/mL — ABNORMAL LOW (ref 0.8–2.0)

## 2021-09-05 LAB — MAGNESIUM: Magnesium: 1.9 mg/dL (ref 1.7–2.4)

## 2021-09-05 SURGERY — RIGHT/LEFT HEART CATH AND CORONARY ANGIOGRAPHY
Anesthesia: LOCAL

## 2021-09-05 MED ORDER — MILRINONE LACTATE IN DEXTROSE 20-5 MG/100ML-% IV SOLN
0.1250 ug/kg/min | INTRAVENOUS | Status: AC
  Administered 2021-09-06 (×2): 0.25 ug/kg/min via INTRAVENOUS
  Filled 2021-09-05 (×3): qty 100

## 2021-09-05 MED ORDER — GADOBUTROL 1 MMOL/ML IV SOLN
10.0000 mL | Freq: Once | INTRAVENOUS | Status: AC | PRN
  Administered 2021-09-05: 10 mL via INTRAVENOUS

## 2021-09-05 MED ORDER — POTASSIUM CHLORIDE CRYS ER 20 MEQ PO TBCR
40.0000 meq | EXTENDED_RELEASE_TABLET | Freq: Once | ORAL | Status: AC
  Administered 2021-09-05: 40 meq via ORAL
  Filled 2021-09-05: qty 2

## 2021-09-05 MED ORDER — MAGNESIUM SULFATE 2 GM/50ML IV SOLN
2.0000 g | Freq: Once | INTRAVENOUS | Status: AC
  Administered 2021-09-05: 2 g via INTRAVENOUS
  Filled 2021-09-05: qty 50

## 2021-09-05 MED ORDER — MILRINONE LACTATE IN DEXTROSE 20-5 MG/100ML-% IV SOLN: 0.2500 ug/kg/min | INTRAVENOUS | Status: DC

## 2021-09-05 NOTE — Discharge Summary (Addendum)
Advanced Heart Failure Team  Discharge Summary   Patient ID: Carlos Mckinney MRN: [id number], DOB/AGE: [birth year] 26 y.o. Admit date: 09/01/2021 D/C date:     09/07/2021   Primary Discharge Diagnoses:  Acute on chronic systolic CHF Cardiogenic shock NSVT Snoring Polysubstance abuse Hypokalemia Hyponatremia Hypomagnesemia Hx PE/DVT Iron deficiency anemia  Hospital Course:   Carlos Mckinney is a 26 y.o.male with hx tobacco use, cocaine abuse, obesity.  Carlos Mckinney's had occasional ED visits over the years.  No routine care.    ED visit 11/2020 with cough, dyspnea, tachycardia/tachypnea.  Chest x-ray with bilateral mild to moderate perihilar and bibasilar infiltrates.  Some suspicion for COVID, pt prescribed CAP coverage and left AMA before covid testing performed.     Carlos Mckinney was incarcerated from May - September 2022. Recently released from prison 04/25/2021.   Hospitalized at [facility] and transferred to [facility] 08/22 for acute systolic HF and was seen by Advanced Heart Failure team. Diuresed with IV lasix and transitioned to po furosemide 20 mg daily.  Started on GDMT with enstresto and spiro. Echo 08/22 with EF 15-20%, mildly dilated RV with normal RV function, mild MR, moderate TR, PAP 40-45 mmHg. Coronary angiogram not performed. 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 Reports heavy cocaine use for a period of time before incarcerated. Denies any recent drug use, still drinking ETOH occasionally.    Established care 05/02/21, mild volume overload and NYHA II-III. Lasix increased.   Seen in ED 05/24/21 for CP and swelling, left without being seen.   Seen back in clinic 11/22 and was fluid overloaded after running out of meds. Diuretics restarted and close f/u was advised but Carlos Mckinney no-showed the last 2 appts.    Carlos Mckinney was admitted 1/123 w/ LE swelling and dyspnea and diagnosed w/ rt sided DVT and b/l PE. Placed on IV heparin>>Eliquis. Also w/ a/c CHF and diuresed w/ IV Lasix.    Seen for f/u 09/01/21 and direct  admitted for acute on chronic systolic CHF and cardiogenic shock. PICC line placed for CVP and co-ox monitoring. Initial co-ox 24%, started on inotrope support with milrinone. Diuresed > 30  lb with IV lasix. Weaned off milrinone once diuresed. UDS + for cocaine and marijuana. Started on GDMT. Refused R/LHC.   cMRI - with severe LVEF 12% and RV 18%. Delayed enhancement images poor due to recent feraheme.   Carlos Mckinney had stopped eliquis PTA d/t hematochezia (felt to be 2/2 hemorrhoids). Hgb stable in 8s. Given feraheme for IDA. Carlos Mckinney was anticoagulated with Eliquis for PE/DVT.  On the day of discharge CO-OX 40%. Carlos Mckinney was aware of the significance but adamant Carlos Mckinney wants to go home to spend time with children. Milrinone stopped and PICC line removed.   No active insurance. HF TOC SW consulted. Carlos Mckinney was assisted with medicaid application. Will receive Jardiance via HF fund, already has patient assistance for entresto, and applied for eliquis assistance.   Discharge Vitals: Blood pressure 106/61, pulse (!) 111, temperature (!) 97.4 F (36.3 C), resp. rate (!) 29, height 5\' 11"  (1.803 m), weight 89.4 kg, SpO2 98 %.  General:  No resp difficulty HEENT: normal Neck: supple. JVP 7-8. Carotids 2+ bilat; no bruits. No lymphadenopathy or thryomegaly appreciated. Cor: PMI nondisplaced. Tachy Regular rate & rhythm. No rubs, or murmurs. +S3  Lungs: clear Abdomen: soft, nontender, nondistended. No hepatosplenomegaly. No bruits or masses. Good bowel sounds. Extremities: no cyanosis, clubbing, rash, edema Neuro: alert & orientedx3, cranial nerves grossly intact. moves all 4 extremities w/o difficulty. Affect  pleasant   Labs: Lab Results  Component Value Date   WBC 8.5 09/06/2021   HGB 9.1 (L) 09/06/2021   HCT 29.6 (L) 09/06/2021   MCV 80.7 09/06/2021   PLT 431 (H) 09/06/2021    Recent Labs  Lab 09/01/21 1200 09/02/21 0600 09/07/21 0413  NA 129*   < > 131*  K 3.9   < > 4.6  CL 97*   < > 100  CO2 22   < > 24   BUN 9   < > <5*  CREATININE 1.19   < > 0.97  CALCIUM 8.6*   < > 9.0  PROT 6.2*  --   --   BILITOT 2.6*  --   --   ALKPHOS 69  --   --   ALT 26  --   --   AST 27  --   --   GLUCOSE 104*   < > 107*   < > = values in this interval not displayed.   No results found for: CHOL, HDL, LDLCALC, TRIG BNP (last 3 results) Recent Labs    07/19/21 0046 08/06/21 1629 09/01/21 1200  BNP 2,693.0* 3,697.9* >4,500.0*    ProBNP (last 3 results) No results for input(s): PROBNP in the last 8760 hours.   Diagnostic Studies/Procedures   cMRI 09/05/21: 1.  Severely dilated LV with diffuse hypokinesis, EF 12%.  2.  Moderately dilated RV with EF 18%.  3. Delayed enhancement images difficult due to recent feraheme, but there do appear to be small areas of non-coronary LGE in the basal inferior and apical inferolateral walls. This could be suggestive of prior myocarditis.  4.  T1 and T2 parametrics not accurate with recent feraheme  Echo 09/02/21:  IMPRESSIONS   1. No apical mural thrombus . Left ventricular ejection fraction, by estimation, is <20%. The left ventricle has severely decreased function. The left ventricle demonstrates global hypokinesis. The left ventricular internal cavity size was severely  dilated. Left ventricular diastolic parameters are indeterminate.   2. Right ventricular systolic function is moderately reduced. The right ventricular size is normal. There is moderately elevated pulmonary artery systolic pressure.   3. Left atrial size was moderately dilated.   4. The pericardial effusion is posterior to the left ventricle and localized near the right atrium.   5. The mitral valve is normal in structure. Trivial mitral valve regurgitation. No evidence of mitral stenosis.   6. Tricuspid valve regurgitation is severe.   7. The aortic valve is tricuspid. Aortic valve regurgitation is not visualized. No aortic stenosis is present.   8. The inferior vena cava is dilated in size  with >50% respiratory variability, suggesting right atrial pressure of 8 mmHg.   Discharge Medications   Allergies as of 09/07/2021   No Known Allergies      Medication List     STOP taking these medications    ferrous sulfate 325 (65 FE) MG tablet       TAKE these medications    albuterol 108 (90 Base) MCG/ACT inhaler Commonly known as: VENTOLIN HFA Inhale 1-2 puffs into the lungs every 6 (six) hours as needed for wheezing or shortness of breath.   apixaban 5 MG Tabs tablet Commonly known as: ELIQUIS Take 1 tablet (5 mg total) by mouth 2 (two) times daily.   digoxin 0.125 MG tablet Commonly known as: LANOXIN Take 1 tablet (0.125 mg total) by mouth daily.   Entresto 24-26 MG Generic drug: sacubitril-valsartan Take 1 tablet by mouth  2 (two) times daily.   furosemide 40 MG tablet Commonly known as: LASIX Take 1 tablet (40 mg total) by mouth daily. What changed: when to take this   Jardiance 10 MG Tabs tablet Generic drug: empagliflozin Take 1 tablet (10 mg total) by mouth daily.   potassium chloride 20 MEQ packet Commonly known as: KLOR-CON Use 1 packet (20 mEq) by mouth daily.   spironolactone 25 MG tablet Commonly known as: ALDACTONE Take 1 tablet (25 mg total) by mouth daily.         Disposition   The patient will be discharged in stable condition to home. Discharge Instructions     (HEART FAILURE PATIENTS) Call MD:  Anytime you have any of the following symptoms: 1) 3 pound weight gain in 24 hours or 5 pounds in 1 week 2) shortness of breath, with or without a dry hacking cough 3) swelling in the hands, feet or stomach 4) if you have to sleep on extra pillows at night in order to breathe.   Complete by: As directed    (HEART FAILURE PATIENTS) Call MD:  Anytime you have any of the following symptoms: 1) 3 pound weight gain in 24 hours or 5 pounds in 1 week 2) shortness of breath, with or without a dry hacking cough 3) swelling in the hands, feet or  stomach 4) if you have to sleep on extra pillows at night in order to breathe.   Complete by: As directed    Diet - low sodium heart healthy   Complete by: As directed    Diet - low sodium heart healthy   Complete by: As directed    Heart Failure patients record your daily weight using the same scale at the same time of day   Complete by: As directed    Increase activity slowly   Complete by: As directed    Increase activity slowly   Complete by: As directed           Duration of Discharge Encounter: Greater than 35 minutes   Signed, Amy Clegg NP-C  09/07/2021, 8:24 AM   Patient seen and examined with the above-signed Advanced Practice Provider and/or Housestaff. I personally reviewed laboratory data, imaging studies and relevant notes. I independently examined the patient and formulated the important aspects of the plan. I have edited the note to reflect any of my changes or salient points. I have personally discussed the plan with the patient and/or family.  Carlos Mckinney has severe biventricular failure. Has refused cath. Has diuresed well but has now proven to be inotrope dependent. Carlos Mckinney is unfortunately not a candidate for advanced therapies with ongoing drug use and social situation.   Co-ox very low today after decrease in milrinone dose. Have advised him that without ongoing support Carlos Mckinney has high risk of decompensation/death int Carlos Mckinney next days to weeks. Carlos Mckinney understands this but still wants to leave. We have done our best to optimize his med regimen.   General:  Sitting up No resp difficulty HEENT: normal Neck: supple. no JVD. Carotids 2+ bilat; no bruits. No lymphadenopathy or thryomegaly appreciated. Cor: PMI laterally displaced. Regular tachy +s3   Lungs: clear Abdomen: soft, nontender, nondistended. No hepatosplenomegaly. No bruits or masses. Good bowel sounds. Extremities: no cyanosis, clubbing, rash, edema Neuro: alert & orientedx3, cranial nerves grossly intact. moves all 4 extremities  w/o difficulty. Affect pleasant  Plan as above. Will arrange f/u in HF Clinic.   Total time spent 45 minutes. Over half that time  spent discussing above.    Arvilla Meres, MD  8:37 AM

## 2021-09-05 NOTE — Progress Notes (Addendum)
Advanced Heart Failure Rounding Note  PCP-Cardiologist: None   Subjective:    02/02: Admitted with cardiogenic shock and marked volume overload. CO-OX 24%--> Started on milrinone 0.25 mcg. UDS + cocaine,THC   Co-ox 66% on 0.375 milrinone.   CVP 3. -4.7L last 24 hrs. Weight down another 14 lb overnight, 193 lb today.   Scr 1.02, Na 130 and K 3.2.  Dyspnea markedly improved. Cough almost resolved. Notes a little bit of lightheadedness with position changes.  Declining R/LHC today  Objective:   Weight Range: 94 kg Body mass index is 28.9 kg/m.   Vital Signs:   Temp:  [98.6 F (37 C)-99.3 F (37.4 C)] 98.7 F (37.1 C) (02/05 2355) Pulse Rate:  [94-112] 105 (02/05 2355) Resp:  [15-20] 19 (02/05 2355) BP: (95-114)/(55-87) 95/55 (02/05 2355) SpO2:  [98 %-99 %] 99 % (02/05 2355) Last BM Date: 09/03/21  Weight change: Filed Weights   09/01/21 1400 09/02/21 0525 09/04/21 0441  Weight: 105.9 kg 105 kg 94 kg    Intake/Output:   Intake/Output Summary (Last 24 hours) at 09/05/2021 0659 Last data filed at 09/04/2021 1600 Gross per 24 hour  Intake 600 ml  Output 5300 ml  Net -4700 ml      Physical Exam   CVP 3 General: No distress, lying comfortably in bed HEENT: normal Neck: supple. no JVD. Carotids 2+ bilat; no bruits.  Cor: PMI nondisplaced. Regular rate & rhythm, tachy. No rubs, gallops or murmurs. Lungs: clear Abdomen: soft, nontender, nondistended. No hepatosplenomegaly.  Extremities: no cyanosis, clubbing, rash, edema, + RUE PICC Neuro: alert & orientedx3, cranial nerves grossly intact. moves all 4 extremities w/o difficulty. Affect pleasant    Telemetry   Sinus/sinus tach, 90s-100s, 17 beat run NSVT   Labs    CBC Recent Labs    09/04/21 0432 09/05/21 0132  WBC 7.9 8.4  HGB 8.2* 8.4*  HCT 26.0* 27.0*  MCV 77.6* 78.3*  PLT 308 367   Basic Metabolic Panel Recent Labs    98/92/11 0432 09/05/21 0132  NA 129* 130*  K 3.2* 3.2*  CL 90* 89*   CO2 31 32  GLUCOSE 134* 159*  BUN <5* <5*  CREATININE 1.06 1.02  CALCIUM 8.6* 8.8*  MG 2.0 1.9   Liver Function Tests No results for input(s): AST, ALT, ALKPHOS, BILITOT, PROT, ALBUMIN in the last 72 hours. No results for input(s): LIPASE, AMYLASE in the last 72 hours. Cardiac Enzymes No results for input(s): CKTOTAL, CKMB, CKMBINDEX, TROPONINI in the last 72 hours.  BNP: BNP (last 3 results) Recent Labs    07/19/21 0046 08/06/21 1629 09/01/21 1200  BNP 2,693.0* 3,697.9* >4,500.0*    ProBNP (last 3 results) No results for input(s): PROBNP in the last 8760 hours.   D-Dimer No results for input(s): DDIMER in the last 72 hours. Hemoglobin A1C Recent Labs    09/04/21 0447  HGBA1C 4.5*   Fasting Lipid Panel No results for input(s): CHOL, HDL, LDLCALC, TRIG, CHOLHDL, LDLDIRECT in the last 72 hours. Thyroid Function Tests No results for input(s): TSH, T4TOTAL, T3FREE, THYROIDAB in the last 72 hours.  Invalid input(s): FREET3  Other results:   Imaging    No results found.   Medications:     Scheduled Medications:  aspirin  81 mg Oral Pre-Cath   Chlorhexidine Gluconate Cloth  6 each Topical Daily   digoxin  0.125 mg Oral Daily   empagliflozin  10 mg Oral Daily   potassium chloride  40 mEq Oral  BID   potassium chloride  40 mEq Oral Once   senna-docusate  1 tablet Oral QHS   sodium chloride flush  10-40 mL Intracatheter Q12H   sodium chloride flush  3 mL Intravenous Q12H   spironolactone  25 mg Oral Daily    Infusions:  sodium chloride     sodium chloride 10 mL/hr at 09/05/21 0523   heparin 2,000 Units/hr (09/04/21 2345)   milrinone 0.375 mcg/kg/min (09/05/21 0655)    PRN Medications: sodium chloride, acetaminophen, guaiFENesin-dextromethorphan, sodium chloride flush    Patient Profile   26 y/o male with h/o drug abuse and severe systolic HF presented to clinic today with 2 weeks of worsening HF.   Admitted with cardiogenic shock and marked  volume overload.   Assessment/Plan   1. 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 Admit for a/c HF 08/22.  - Echo (8/22): EF 15-20%, mildly dilated RV with normal RV function, mild MR, moderate TR, PAP 40-45 mmHg - Echo 08/07/21 EF < 20%.  - Etiology not certain. ? cocaine use, viral myocarditis or LV noncompaction. Father has heart issues and maternal cousin has systolic HF. Ischemic not likely at his age. LVH on ECG and h/o HTN, ? component of HTN CM.  - Initial CO-OX 24% --> Co-ox 66% this am on 0.375 milrinone. Now that diuresed, will attempt milrinone wean. Decrease mil to 0.25. - Significant diuresis over the last few days. Negative 17L since admit. 233 >> 193 lb. - CVP 3. Hold further diuretics for now. - Hold ARNi with low BP. May consider adding ARB prior to discharge - Continue digoxin 0.125 mg. Dig level < 0.2 this am. - Continue spiro  25 - Continue Jardiance - No BB with CGS - Declined R/LHC, states he may consider another time - cMRI today - Not a candidate for advanced therapies with polysubstance abuse.  - Long discussion about high-risk nature of his situation and need for cessation of substance and to cooperate with treatment teams  2. NSVT: - 17 beat run on telemetry this am - supp K and Mag    3. H/o Snoring: - Sleep study once has insurance.   4. Substance Use: - cocaine/ETOH and tobacco use history  - Counseled on quitting. - UDS on admit -->+ cocaine  + THC  5. Hypokalemia/hypomag/hyponatremia - supp K and mag - free water restrict   6. PE/ RLE DVT - diagnosed 08/06/21, placed on Eliquis - pt self discontinue Eliquis ~1 wk ago due to hematochezia. Denies further bleeding in the last few days - heparin per pharmacy. No evidence of bleeding  7. Recent Hematochezia/ Iron Deficiency Anemia  - felt 2/2 hemorrhoids. Noted previous admit 1/23. Hgb 8.4 today. - Iron sats low. Given dose of  feraheme on 2/3 - No evidence of rebleeding     Length of Stay: 4  FINCH, LINDSAY N, PA-C  09/05/2021, 6:59 AM  Advanced Heart Failure Team Pager [phone] (M-F; 7a - 5p)  Please contact [facility] for night-coverage after hours (5p -7a ) and weekends on [email or web address]   Patient seen and examined with the above-signed Advanced Practice Provider and/or Housestaff. I personally reviewed laboratory data, imaging studies and relevant notes. I independently examined the patient and formulated the important aspects of the plan. I have edited the note to reflect any of my changes or salient points. I have personally discussed the plan with the patient and/or family.  He  remains on milrinone. Co-ox 66%. Weight down 40 pounds. Breathing better. Feels lightheaded with walking. Had some NSVT overnight. Refusing cath.   General:  Sitting up on side of bed  No resp difficulty HEENT: normal Neck: supple. JVP 5-6 Cor: PMI nondisplaced. Regular tachy . No rubs, gallops or murmurs. Lungs: clear Abdomen: soft, nontender, nondistended. No hepatosplenomegaly. No bruits or masses. Good bowel sounds. Extremities: no cyanosis, clubbing, rash, edema Neuro: alert & orientedx3, cranial nerves grossly intact. moves all 4 extremities w/o difficulty. Affect pleasant  He is fully diuresed but remains tenuous. Refusing cath. Will get cMRI. Begin milrinone wean. Titrate GDMT as tolerated.    Arvilla Meres, MD  8:16 AM

## 2021-09-05 NOTE — Plan of Care (Signed)
°  Problem: Cardiac: Goal: Ability to achieve and maintain adequate cardiopulmonary perfusion will improve Outcome: Progressing   Problem: Activity: Goal: Capacity to carry out activities will improve Outcome: Progressing   Problem: Cardiac: Goal: Ability to achieve and maintain adequate cardiopulmonary perfusion will improve Outcome: Progressing   Problem: Clinical Measurements: Goal: Ability to maintain clinical measurements within normal limits will improve Outcome: Progressing Goal: Will remain free from infection Outcome: Progressing Goal: Diagnostic test results will improve Outcome: Progressing Goal: Respiratory complications will improve Outcome: Progressing Goal: Cardiovascular complication will be avoided Outcome: Progressing   Problem: Activity: Goal: Risk for activity intolerance will decrease Outcome: Progressing   Problem: Elimination: Goal: Will not experience complications related to bowel motility Outcome: Progressing Goal: Will not experience complications related to urinary retention Outcome: Progressing   Problem: Pain Managment: Goal: General experience of comfort will improve Outcome: Progressing   Problem: Safety: Goal: Ability to remain free from injury will improve Outcome: Progressing   Problem: Skin Integrity: Goal: Risk for impaired skin integrity will decrease Outcome: Progressing   Problem: Education: Goal: Ability to demonstrate management of disease process will improve Outcome: Not Progressing Goal: Ability to verbalize understanding of medication therapies will improve Outcome: Not Progressing Goal: Individualized Educational Video(s) Outcome: Not Progressing   Problem: Activity: Goal: Capacity to carry out activities will improve Outcome: Not Progressing   Problem: Education: Goal: Ability to demonstrate management of disease process will improve Outcome: Not Progressing Goal: Ability to verbalize understanding of  medication therapies will improve Outcome: Not Progressing Goal: Individualized Educational Video(s) Outcome: Not Progressing   Problem: Education: Goal: Knowledge of General Education information will improve Description: Including pain rating scale, medication(s)/side effects and non-pharmacologic comfort measures Outcome: Not Progressing   Problem: Health Behavior/Discharge Planning: Goal: Ability to manage health-related needs will improve Outcome: Not Progressing   Problem: Nutrition: Goal: Adequate nutrition will be maintained Outcome: Not Progressing   Problem: Coping: Goal: Level of anxiety will decrease Outcome: Not Progressing

## 2021-09-05 NOTE — Progress Notes (Signed)
ANTICOAGULATION CONSULT NOTE  Pharmacy Consult for Heparin Indication: pulmonary embolus  No Known Allergies  Patient Measurements: Height: 5\' 11"  (180.3 cm) Weight: 87.8 kg (193 lb 9 oz) IBW/kg (Calculated) : 75.3  Heparin Dosing Weight: 106 kg  Vital Signs: Temp: 98.3 F (36.8 C) (02/06 0828) Temp Source: Oral (02/06 0828) BP: 104/65 (02/06 0828) Pulse Rate: 103 (02/06 0828)  Labs: Recent Labs    09/03/21 0515 09/03/21 0921 09/03/21 1427 09/04/21 0432 09/04/21 0619 09/05/21 0132  HGB 7.8*  --   --  8.2*  --  8.4*  HCT 24.4*  --   --  26.0*  --  27.0*  PLT 282  --   --  308  --  367  HEPARINUNFRC  --    < > 0.52  --  0.40 0.39  CREATININE 0.90  --   --  1.06  --  1.02   < > = values in this interval not displayed.     Estimated Creatinine Clearance: 115.9 mL/min (by C-G formula based on SCr of 1.02 mg/dL).   Assessment: 74 yom with HF EF < 20% admitted with cardiogenic shock and marked volume overload. Previously diagnosed with PE 07/23 but stopped his apixaban PTA due to hematochezia per patient. Pharmacy consulted to manage heparin while apixaban is on hold.   Heparin level remains therapeutic at 0.39, on heparin at 2000 units/hr. Hgb stable at 8.4, plt WNL at 367. No s/sx of bleeding or infusion issues per RN.  Patient will likely need LHC/RHC once diuresis is completed per HF team.   Goal of Therapy:  Heparin level 0.3-0.7 units/ml Monitor platelets by anticoagulation protocol: Yes   Plan:  Continue heparin at 2000 units/hr.  Monitor daily HL  Continue to monitor H&H and platelets F/u plans for continued diuresis and LHC/RHC    Thank you for allowing pharmacy to be a part of this patients care.  Antonietta Jewel, PharmD, [facility] Clinical Pharmacist  Phone: [phone] 09/05/2021 11:14 AM  Please check AMION for all [facility] phone numbers After 10:00 PM, call [place] [phone]

## 2021-09-05 NOTE — Progress Notes (Signed)
Refused to take potassium 40  x 1 tablet form claiming he needs to take the powder from sitting in his table. Explained the importance of taking potassium.

## 2021-09-05 NOTE — Progress Notes (Signed)
Transported to [facility] for cardiac MRI by bed awake and alert.

## 2021-09-05 NOTE — Progress Notes (Signed)
Back from [facility] med by bed awake and alert. 

## 2021-09-06 ENCOUNTER — Other Ambulatory Visit (HOSPITAL_COMMUNITY): Payer: Self-pay

## 2021-09-06 ENCOUNTER — Inpatient Hospital Stay (HOSPITAL_COMMUNITY): Payer: Medicaid Other

## 2021-09-06 DIAGNOSIS — I5033 Acute on chronic diastolic (congestive) heart failure: Secondary | ICD-10-CM | POA: Diagnosis not present

## 2021-09-06 LAB — CBC
HCT: 29.6 % — ABNORMAL LOW (ref 39.0–52.0)
Hemoglobin: 9.1 g/dL — ABNORMAL LOW (ref 13.0–17.0)
MCH: 24.8 pg — ABNORMAL LOW (ref 26.0–34.0)
MCHC: 30.7 g/dL (ref 30.0–36.0)
MCV: 80.7 fL (ref 80.0–100.0)
Platelets: 431 10*3/uL — ABNORMAL HIGH (ref 150–400)
RBC: 3.67 MIL/uL — ABNORMAL LOW (ref 4.22–5.81)
RDW: 22.8 % — ABNORMAL HIGH (ref 11.5–15.5)
WBC: 8.5 10*3/uL (ref 4.0–10.5)
nRBC: 0 % (ref 0.0–0.2)

## 2021-09-06 LAB — BASIC METABOLIC PANEL
Anion gap: 8 (ref 5–15)
BUN: <5 mg/dL — ABNORMAL LOW (ref 6–20)
CO2: 27 mmol/L (ref 22–32)
Calcium: 8.9 mg/dL (ref 8.9–10.3)
Chloride: 98 mmol/L (ref 98–111)
Creatinine, Ser: 0.91 mg/dL (ref 0.61–1.24)
GFR, Estimated: >60 mL/min (ref >60)
Glucose, Bld: 130 mg/dL — ABNORMAL HIGH (ref 70–99)
Potassium: 3.8 mmol/L (ref 3.5–5.1)
Sodium: 133 mmol/L — ABNORMAL LOW (ref 135–145)

## 2021-09-06 LAB — COOXEMETRY PANEL
Carboxyhemoglobin: 2 % — ABNORMAL HIGH (ref 0.5–1.5)
Methemoglobin: 0.7 % (ref 0.0–1.5)
O2 Saturation: 51.3 %
Total hemoglobin: 9.4 g/dL — ABNORMAL LOW (ref 12.0–16.0)

## 2021-09-06 LAB — HEPARIN LEVEL (UNFRACTIONATED): Heparin Unfractionated: 0.51 IU/mL (ref 0.30–0.70)

## 2021-09-06 LAB — MAGNESIUM: Magnesium: 2 mg/dL (ref 1.7–2.4)

## 2021-09-06 MED ORDER — APIXABAN 5 MG PO TABS
5.0000 mg | ORAL_TABLET | Freq: Two times a day (BID) | ORAL | Status: DC
  Administered 2021-09-06 – 2021-09-07 (×3): 5 mg via ORAL
  Filled 2021-09-06 (×3): qty 1

## 2021-09-06 MED ORDER — DIGOXIN 125 MCG PO TABS
0.1250 mg | ORAL_TABLET | Freq: Every day | ORAL | 6 refills | Status: DC
  Filled 2021-09-06 – 2021-11-15 (×2): qty 30, 30d supply, fill #0
  Filled 2021-11-15 – 2022-01-06 (×2): qty 30, 30d supply, fill #1
  Filled 2022-03-09: qty 30, 30d supply, fill #2
  Filled 2022-04-18: qty 30, 30d supply, fill #3
  Filled 2022-05-08: qty 30, 30d supply, fill #4
  Filled 2022-06-19 (×2): qty 30, 30d supply, fill #5

## 2021-09-06 MED ORDER — POTASSIUM CHLORIDE 20 MEQ PO PACK
20.0000 meq | PACK | Freq: Every day | ORAL | 6 refills | Status: DC
  Filled 2021-09-06: qty 30, 30d supply, fill #0

## 2021-09-06 MED ORDER — LOSARTAN POTASSIUM 25 MG PO TABS
12.5000 mg | ORAL_TABLET | Freq: Every day | ORAL | Status: DC
  Administered 2021-09-06: 12.5 mg via ORAL
  Filled 2021-09-06: qty 1

## 2021-09-06 MED ORDER — EMPAGLIFLOZIN 10 MG PO TABS
10.0000 mg | ORAL_TABLET | Freq: Every day | ORAL | 6 refills | Status: DC
Start: 2021-09-07 — End: 2022-03-13
  Filled 2021-09-06: qty 30, 30d supply, fill #0
  Filled 2021-10-14: qty 30, 30d supply, fill #1
  Filled 2021-10-17: qty 30, 30d supply, fill #0
  Filled 2021-11-15: qty 30, 30d supply, fill #1
  Filled 2022-01-06: qty 30, 30d supply, fill #2
  Filled 2022-03-09: qty 30, 30d supply, fill #3

## 2021-09-06 MED ORDER — SPIRONOLACTONE 25 MG PO TABS
25.0000 mg | ORAL_TABLET | Freq: Every day | ORAL | 6 refills | Status: DC
  Filled 2021-09-06: qty 30, 30d supply, fill #0
  Filled 2021-10-14: qty 30, 30d supply, fill #1
  Filled 2021-10-17: qty 30, 30d supply, fill #0
  Filled 2021-11-15: qty 30, 30d supply, fill #1
  Filled 2022-01-06: qty 30, 30d supply, fill #2
  Filled 2022-03-09: qty 30, 30d supply, fill #3
  Filled 2022-04-19: qty 30, 30d supply, fill #4
  Filled 2022-05-08: qty 30, 30d supply, fill #5

## 2021-09-06 MED ORDER — APIXABAN 5 MG PO TABS
5.0000 mg | ORAL_TABLET | Freq: Two times a day (BID) | ORAL | 6 refills | Status: DC
  Filled 2021-09-06: qty 60, 30d supply, fill #0

## 2021-09-06 MED ORDER — FUROSEMIDE 40 MG PO TABS
40.0000 mg | ORAL_TABLET | Freq: Every day | ORAL | 3 refills | Status: DC
  Filled 2021-09-06: qty 30, 30d supply, fill #0
  Filled 2021-10-14: qty 30, 30d supply, fill #1
  Filled 2021-10-17: qty 30, 30d supply, fill #0
  Filled 2021-11-15: qty 30, 30d supply, fill #1
  Filled 2022-01-06: qty 30, 30d supply, fill #2

## 2021-09-06 MED ORDER — SODIUM CHLORIDE 0.9 % IV SOLN
510.0000 mg | Freq: Once | INTRAVENOUS | Status: AC
  Administered 2021-09-06: 510 mg via INTRAVENOUS
  Filled 2021-09-06: qty 17

## 2021-09-06 NOTE — Progress Notes (Signed)
Claimed that he is coughing out blood.became verbally abusive to undersigned  claiming that no one is checking on him coughing out blood.  Coughed  out sputum checked  and found out the color is light pink and whitish sputum. Pt took the potassium powder  mixed with cranberry  late and that's what he  coughed out.

## 2021-09-06 NOTE — Progress Notes (Addendum)
Advanced Heart Failure Rounding Note  PCP-Cardiologist: None   Subjective:    02/02: Admitted with cardiogenic shock and marked volume overload. CO-OX 24%--> Started on milrinone 0.25 mcg. UDS + cocaine,THC   Refused cath yesterday   cMRI 2/6 EF 12% RVEF 18% minimal LGE. Moderate left pleural effusion   Milrinone turned down to 0.25 yesterday. C-ox dropped to 51% CVP 3  Denies SOB, orthopnea or PND. Wants to go home.   Objective:   Weight Range: 88.9 kg Body mass index is 27.33 kg/m.   Vital Signs:   Temp:  [97.6 F (36.4 C)-98.3 F (36.8 C)] 98.2 F (36.8 C) (02/07 0301) Pulse Rate:  [100-117] 105 (02/07 0301) Resp:  [15-20] 20 (02/07 0301) BP: (98-107)/(60-78) 98/64 (02/07 0301) SpO2:  [97 %-99 %] 99 % (02/07 0301) Weight:  [88.9 kg] 88.9 kg (02/07 0500) Last BM Date: 09/03/21  Weight change: Filed Weights   09/04/21 0441 09/04/21 2355 09/06/21 0500  Weight: 94 kg 87.8 kg 88.9 kg    Intake/Output:   Intake/Output Summary (Last 24 hours) at 09/06/2021 0635 Last data filed at 09/05/2021 1900 Gross per 24 hour  Intake 2871.69 ml  Output 1300 ml  Net 1571.69 ml       Physical Exam   General:  Lying flat in bed  No resp difficulty HEENT: normal Neck: supple. no JVD. Carotids 2+ bilat; no bruits. No lymphadenopathy or thryomegaly appreciated. Cor: Tachy regular + s3 No rubs, gallops or murmurs. Lungs: clear Abdomen: soft, nontender, nondistended. No hepatosplenomegaly. No bruits or masses. Good bowel sounds. Extremities: no cyanosis, clubbing, rash, edema Neuro: alert & orientedx3, cranial nerves grossly intact. moves all 4 extremities w/o difficulty. Affect pleasant    Telemetry   Sinus/sinus tach, 100-110 Personally reviewed   Labs    CBC Recent Labs    09/05/21 0132 09/06/21 0500  WBC 8.4 8.5  HGB 8.4* 9.1*  HCT 27.0* 29.6*  MCV 78.3* 80.7  PLT 367 431*    Basic Metabolic Panel Recent Labs    09/05/21 0132 09/06/21 0500  NA  130* 133*  K 3.2* 3.8  CL 89* 98  CO2 32 27  GLUCOSE 159* 130*  BUN <5* <5*  CREATININE 1.02 0.91  CALCIUM 8.8* 8.9  MG 1.9 2.0    Liver Function Tests No results for input(s): AST, ALT, ALKPHOS, BILITOT, PROT, ALBUMIN in the last 72 hours. No results for input(s): LIPASE, AMYLASE in the last 72 hours. Cardiac Enzymes No results for input(s): CKTOTAL, CKMB, CKMBINDEX, TROPONINI in the last 72 hours.  BNP: BNP (last 3 results) Recent Labs    07/19/21 0046 08/06/21 1629 09/01/21 1200  BNP 2,693.0* 3,697.9* >4,500.0*     ProBNP (last 3 results) No results for input(s): PROBNP in the last 8760 hours.   D-Dimer No results for input(s): DDIMER in the last 72 hours. Hemoglobin A1C Recent Labs    09/04/21 0447  HGBA1C 4.5*    Fasting Lipid Panel No results for input(s): CHOL, HDL, LDLCALC, TRIG, CHOLHDL, LDLDIRECT in the last 72 hours. Thyroid Function Tests No results for input(s): TSH, T4TOTAL, T3FREE, THYROIDAB in the last 72 hours.  Invalid input(s): FREET3  Other results:   Imaging    MR CARDIAC MORPHOLOGY W WO CONTRAST  Result Date: 09/05/2021 CLINICAL DATA:  Cardiomyopathy of uncertain etiology EXAM: CARDIAC MRI TECHNIQUE: The patient was scanned on a 1.5 Tesla GE magnet. A dedicated cardiac coil was used. Functional imaging was done using Fiesta sequences. 2,3, and  4 chamber views were done to assess for RWMA's. Modified Simpson's rule using a short axis stack was used to calculate an ejection fraction on a dedicated work Conservation officer, nature. The patient received 10 cc of Gadavist. After 10 minutes inversion recovery sequences were used to assess for infiltration and scar tissue. CONTRAST:  Gadavist 10 cc FINDINGS: Moderate right pleural effusion with associated atelectasis. Small circumferential pericardial effusion. The left ventricle was severely dilated with normal wall thickness. No LV thrombus noted. Diffuse LV hypokinesis, EF 12%. Moderately  dilated RV with severe systolic dysfunction, EF [id number]. Moderate left and right atrial dilation. There was at least moderate tricuspid regurgitation. Probably mild mitral regurgitation. Trileaflet aortic valve with no stenosis or regurgitation noted. First pass perfusion images were normal. Delayed enhancement images showed a small area of mid-wall late gadolinium enhancement in the basal inferior wall and in the apical inferolateral wall. MEASUREMENTS: MEASUREMENTS LVEDV 482 mL LVSV 60 mL LVEF 12% RVEDV 292 mL RVSV 53 mL RVEF 18% T1 and T2 indices not accurate due to recent Feraheme. IMPRESSION: 1.  Severely dilated LV with diffuse hypokinesis, EF 12%. 2.  Moderately dilated RV with EF 18%. 3. Delayed enhancement images difficult due to recent feraheme, but there do appear to be small areas of non-coronary LGE in the basal inferior and apical inferolateral walls. This could be suggestive of prior myocarditis. 4.  T1 and T2 parametrics not accurate with recent feraheme. Dalton Mclean Electronically Signed   By: Loralie Champagne M.D.   On: 09/05/2021 18:49     Medications:     Scheduled Medications:  Chlorhexidine Gluconate Cloth  6 each Topical Daily   digoxin  0.125 mg Oral Daily   empagliflozin  10 mg Oral Daily   potassium chloride  40 mEq Oral BID   senna-docusate  1 tablet Oral QHS   sodium chloride flush  10-40 mL Intracatheter Q12H   sodium chloride flush  3 mL Intravenous Q12H   spironolactone  25 mg Oral Daily    Infusions:  sodium chloride     sodium chloride 10 mL/hr at 09/05/21 1000   heparin 2,000 Units/hr (09/06/21 0217)   milrinone 0.25 mcg/kg/min (09/06/21 0219)    PRN Medications: sodium chloride, acetaminophen, guaiFENesin-dextromethorphan, sodium chloride flush    Patient Profile   26 y/o male with h/o drug abuse and severe systolic HF presented to clinic today with 2 weeks of worsening HF.   Admitted with cardiogenic shock and marked volume overload.   Assessment/Plan    1. 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 Admit for a/c HF 08/22.  - Echo (8/22): EF 15-20%, mildly dilated RV with normal RV function, mild MR, moderate TR, PAP 40-45 mmHg - Echo 08/07/21 EF < 20%.  - Etiology not certain.  - Initial CO-OX 24% --> Co-ox 66% this am on 0.375 milrinone. Now that diuresed, will attempt milrinone wean.  - Milrinone decreased to 0.25. Co-ox down to 51% Will repeat co-ox. Keep milrinone at 0.25 - Significant diuresis over the last few days. Negative 17L since admit. 233 >> 193 lb. - CVP 3. Continue to hod further diuretics for now. - Continue digoxin 0.125 mg. Dig level < 0.2 this am. - Continue spiro  25 - Continue Jardiance - Try to add low-odse losartan 12.5 at night - No BB with CGS - Declined R/LHC, states he may consider another time - cMRI 2/6 EF 12% RVEF 18%  minimal LGE. Moderate left pleural effusion  - Not a candidate for advanced therapies with polysubstance abuse.  - Long discussion about high-risk nature of his situation and need for cessation of substance and to cooperate with treatment teams - I worry weaning inotropes will be difficult  2. NSVT: - Keep K > 4.0 Mg > 2,0    3. H/o Snoring: - Sleep study once has insurance.   4. Substance Use: - cocaine/ETOH and tobacco use history  - Counseled on quitting. - UDS on admit -->+ cocaine  + THC  5. Hypokalemia/hypomag/hyponatremia - supp K and mag - free water restrict   6. PE/ RLE DVT - diagnosed 08/06/21, placed on Eliquis - pt self discontinue Eliquis ~1 wk ago due to hematochezia. Denies further bleeding in the last few days - heparin per pharmacy. No evidence of bleeding  7. Recent Hematochezia/ Iron Deficiency Anemia  - felt 2/2 hemorrhoids. Noted previous admit 1/23. Hgb 9.4 today. - Iron sats low. Given dose of feraheme on 2/3 - No evidence of rebleeding  8. Pleural effusion - seen on cMRI.  - check  2v CXR today   Length of Stay: [place], MD  09/06/2021, 6:35 AM  Advanced Heart Failure Team Pager [phone] (M-F; 7a - 5p)  Please contact [facility] for night-coverage after hours (5p -7a ) and weekends on [email or web address]

## 2021-09-06 NOTE — Progress Notes (Signed)
Ate food brought in by family.

## 2021-09-06 NOTE — Progress Notes (Addendum)
ANTICOAGULATION CONSULT NOTE  Pharmacy Consult for Heparin > switch back to Eliquis Indication: pulmonary embolus  No Known Allergies  Patient Measurements: Height: 5\' 11"  (180.3 cm) Weight: 88.9 kg (195 lb 15.8 oz) IBW/kg (Calculated) : 75.3  Heparin Dosing Weight: 106 kg  Vital Signs: Temp: 98.8 F (37.1 C) (02/07 1124) Temp Source: Oral (02/07 1124) BP: 120/81 (02/07 1124) Pulse Rate: 106 (02/07 1124)  Labs: Recent Labs    09/04/21 0432 09/04/21 0619 09/05/21 0132 09/06/21 0500  HGB 8.2*  --  8.4* 9.1*  HCT 26.0*  --  27.0* 29.6*  PLT 308  --  367 431*  HEPARINUNFRC  --  0.40 0.39 0.51  CREATININE 1.06  --  1.02 0.91     Estimated Creatinine Clearance: 129.9 mL/min (by C-G formula based on SCr of 0.91 mg/dL).   Assessment: 25 yom with HF EF < 20% admitted with cardiogenic shock and marked volume overload. Previously diagnosed with PE 07/23 but stopped his apixaban PTA due to hematochezia per patient. Pharmacy consulted to manage heparin while apixaban is on hold.   Patient declining [facility] - pharmacy asked to switch back to Eliquis now.  Goal of Therapy:  Heparin level 0.3-0.7 units/ml Monitor platelets by anticoagulation protocol: Yes   Plan:  Stop IV heparin. Resume Eliquis 5 mg po BID  [facility], Reece Leader, [facility] Clinical Pharmacist  09/06/2021 2:55 PM   [facility] pharmacy phone numbers are listed on [email or web address]

## 2021-09-06 NOTE — TOC CM/SW Note (Signed)
HF TOC CM arranged follow appt with PCP, [facility] for 09/16/2021 at 9 am. Will assist with medications using MATCH/HF Funds at dc. Isidoro Donning RN3 CCM, Heart Failure TOC CM [phone]

## 2021-09-07 DIAGNOSIS — I5033 Acute on chronic diastolic (congestive) heart failure: Secondary | ICD-10-CM | POA: Diagnosis not present

## 2021-09-07 LAB — COOXEMETRY PANEL
Carboxyhemoglobin: 1.6 % — ABNORMAL HIGH (ref 0.5–1.5)
Methemoglobin: 0.9 % (ref 0.0–1.5)
O2 Saturation: 40.7 %
Total hemoglobin: 11 g/dL — ABNORMAL LOW (ref 12.0–16.0)

## 2021-09-07 LAB — BASIC METABOLIC PANEL
Anion gap: 7 (ref 5–15)
BUN: <5 mg/dL — ABNORMAL LOW (ref 6–20)
CO2: 24 mmol/L (ref 22–32)
Calcium: 9 mg/dL (ref 8.9–10.3)
Chloride: 100 mmol/L (ref 98–111)
Creatinine, Ser: 0.97 mg/dL (ref 0.61–1.24)
GFR, Estimated: >60 mL/min (ref >60)
Glucose, Bld: 107 mg/dL — ABNORMAL HIGH (ref 70–99)
Potassium: 4.6 mmol/L (ref 3.5–5.1)
Sodium: 131 mmol/L — ABNORMAL LOW (ref 135–145)

## 2021-09-07 LAB — MAGNESIUM: Magnesium: 1.8 mg/dL (ref 1.7–2.4)

## 2021-09-07 MED ORDER — MAGNESIUM SULFATE 2 GM/50ML IV SOLN
2.0000 g | Freq: Once | INTRAVENOUS | Status: AC
  Administered 2021-09-07: 2 g via INTRAVENOUS
  Filled 2021-09-07: qty 50

## 2021-09-07 NOTE — Plan of Care (Signed)
°  Problem: Activity: Goal: Capacity to carry out activities will improve Outcome: Progressing   Problem: Cardiac: Goal: Ability to achieve and maintain adequate cardiopulmonary perfusion will improve Outcome: Progressing   Problem: Activity: Goal: Capacity to carry out activities will improve Outcome: Progressing   Problem: Education: Goal: Knowledge of General Education information will improve Description: Including pain rating scale, medication(s)/side effects and non-pharmacologic comfort measures Outcome: Progressing   Problem: Clinical Measurements: Goal: Will remain free from infection Outcome: Progressing   Problem: Elimination: Goal: Will not experience complications related to bowel motility Outcome: Progressing Goal: Will not experience complications related to urinary retention Outcome: Progressing   Problem: Safety: Goal: Ability to remain free from injury will improve Outcome: Progressing

## 2021-09-09 DIAGNOSIS — R4182 Altered mental status, unspecified: Secondary | ICD-10-CM | POA: Diagnosis not present

## 2021-09-09 DIAGNOSIS — F1910 Other psychoactive substance abuse, uncomplicated: Secondary | ICD-10-CM | POA: Diagnosis not present

## 2021-09-13 ENCOUNTER — Telehealth (HOSPITAL_COMMUNITY): Payer: Self-pay

## 2021-09-13 NOTE — Progress Notes (Signed)
ADVANCED HF CLINIC NOTE  Primary Care: Pending HF Cardiologist: Dr. Haroldine Laws  Reason for Visit: Chronic Systolic Heart Failure   HPI: Carlos Mckinney is a 26 y.o.male with hx tobacco use, cocaine abuse, obesity.  He's had occasional ED visits over the years.  No routine care.   ED visit 11/2020 with cough, dyspnea, tachycardia/tachypnea.  Chest x-ray with bilateral mild to moderate perihilar and bibasilar infiltrates.  Some suspicion for COVID, pt prescribed CAP coverage and left AMA before covid testing performed.    He was incarcerated from May - September 2022. Released from prison 04/25/2021.  Hospitalized at [facility] and transferred to [id number] for acute systolic HF and was seen by Advanced Heart Failure team. Diuresed with IV lasix and transitioned to po furosemide 20 mg daily.  Started on GDMT with enstresto and spiro. Echo 08/22 with EF 15-20%, mildly dilated RV with normal RV function, mild MR, moderate TR, PAP 40-45 mmHg. Coronary angiogram not performed. 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 Reports heavy cocaine use for a period of time before incarcerated. Denies any recent drug use, still drinking ETOH occasionally.   Established care 05/02/21, mild volume overload and NYHA II-III. Lasix increased.  Seen in ED 05/24/21 for CP and swelling, left without being seen.  Seen back in clinic 11/22 and was fluid overloaded after running out of meds. Diuretics restarted and close f/u was advised but he no-showed the last 2 appts.   Direct admit from clinic 2/23 for a/c CHF and cardiogenic shock. PICC line placed for CVP and co-ox monitoring. Initial co-ox 24%, started on inotrope support with milrinone. Diuresed > 30  lb with IV lasix. Weaned off milrinone once diuresed. UDS + for cocaine and marijuana. Started on GDMT. Refused R/LHC. cMRI - with severe LVEF 12% and RV 18%. Delayed enhancement images poor due to recent feraheme. Previously stopped Eliquis due to hematochezia (felt to be  2/2 hemorrhoids), restarted. He was adamant to go home despite CoOx 40%. Discussed risks and PICC removed. Discharged home, weight 196 lbs.   Today he returns for post hospital HF follow up. Feels much better. Some SOB if he pushes himself, but does OK walking on flat ground. Does not try to go up stairs or pick up his children. Has some LE swelling still. Denies abnormal bleeding, palpitations, CP, dizziness, or PND/Orthopnea. Appetite ok. No fever or chills. Not weighing at home. Taking all medications, but occasionally sleeps in and misses a few doses. No cocaine use since hospitalization, smokes THC, no ETOH use.  Review of systems complete and found to be negative unless listed in HPI.    Current Outpatient Medications  Medication Sig Dispense Refill   albuterol (VENTOLIN HFA) 108 (90 Base) MCG/ACT inhaler Inhale 1-2 puffs into the lungs every 6 (six) hours as needed for wheezing or shortness of breath.     apixaban (ELIQUIS) 5 MG TABS tablet Take 1 tablet (5 mg total) by mouth 2 (two) times daily. 60 tablet 6   digoxin (LANOXIN) 0.125 MG tablet Take 1 tablet (0.125 mg total) by mouth daily. 30 tablet 6   empagliflozin (JARDIANCE) 10 MG TABS tablet Take 1 tablet (10 mg total) by mouth daily. 30 tablet 6   furosemide (LASIX) 40 MG tablet Take 1 tablet (40 mg total) by mouth daily. 30 tablet 3   hydrocortisone (ANUSOL-HC) 25 MG suppository Place 1 suppository (25 mg total) rectally 2 (two) times daily. 12 suppository 0   phenylephrine-shark liver oil-mineral oil-petrolatum (PREPARATION H) 0.25-14-74.9 %  rectal ointment Place 1 application rectally 2 (two) times daily as needed for hemorrhoids. 25 g 1   polyethylene glycol (MIRALAX / GLYCOLAX) 17 g packet Take 17 g by mouth daily. 30 each 4   potassium chloride (KLOR-CON) 20 MEQ packet Use 1 packet (20 mEq) by mouth daily. 30 packet 6   sacubitril-valsartan (ENTRESTO) 49-51 MG Take 1 tablet by mouth 2 (two) times daily. 60 tablet 4    spironolactone (ALDACTONE) 25 MG tablet Take 1 tablet (25 mg total) by mouth daily. 30 tablet 6   No current facility-administered medications for this encounter.   No Known Allergies  Social History   Socioeconomic History   Marital status: Single    Spouse name: Not on file   Number of children: Not on file   Years of education: Not on file   Highest education level: Not on file  Occupational History   Not on file  Tobacco Use   Smoking status: Former    Types: Cigarettes   Smokeless tobacco: Never  Substance and Sexual Activity   Alcohol use: No   Drug use: No   Sexual activity: Not on file  Other Topics Concern   Not on file  Social History Narrative   Not on file   Social Determinants of Health   Financial Resource Strain: High Risk   Difficulty of Paying Living Expenses: Hard  Food Insecurity: No Food Insecurity   Worried About Running Out of Food in the Last Year: Never true   Ran Out of Food in the Last Year: Never true  Transportation Needs: No Transportation Needs   Lack of Transportation (Medical): No   Lack of Transportation (Non-Medical): No  Physical Activity: Not on file  Stress: Not on file  Social Connections: Not on file  Intimate Partner Violence: Not on file   FH: His maternal cousin has systolic HF and father has heart issues but details not known.   SH: Homeless. Staying with grandmother presently. Unable to work, applying for disability.  BP 130/90    Pulse (!) 109    Wt 92.4 kg (203 lb 9.6 oz)    SpO2 100%    BMI 28.40 kg/m   Wt Readings from Last 3 Encounters:  09/14/21 92.4 kg (203 lb 9.6 oz)  09/07/21 89.4 kg (197 lb 1.5 oz)  09/01/21 107.2 kg (236 lb 6.4 oz)   PHYSICAL EXAM: General:  NAD. No resp difficulty HEENT: Normal Neck: Supple. JVP 7-8. Carotids 2+ bilat; no bruits. No lymphadenopathy or thryomegaly appreciated. Cor: PMI nondisplaced. Tachy rate & rhythm. No rubs, gallops or murmurs. Lungs: Clear Abdomen: Soft,  nontender, nondistended. No hepatosplenomegaly. No bruits or masses. Good bowel sounds. Extremities: No cyanosis, clubbing, rash, 1+ BLE edema Neuro: Alert & oriented x 3, cranial nerves grossly intact. Moves all 4 extremities w/o difficulty. Affect pleasant.  ECG: ST 104 bpm (personally reviewed).  ReDs: 38%  ASSESSMENT & PLAN: 1. 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 Admit for a/c HF 08/22.  - Echo (8/22): EF 15-20%, mildly dilated RV with normal RV function, mild MR, moderate TR, PAP 40-45 mmHg - Echo 08/07/21 EF < 20%.  - Declined R/LHC, states he may consider another time - cMRI 2/6 EF 12% RVEF 18% minimal LGE. Moderate left pleural effusion  - NYHA II, volume better but still mildly up, ReDs 38%. - Increase Entresto to 49/51 mg bid. - Continue digoxin 0.125 mg daily. - Continue spiro 25 mg  daily. - Continue Jardiance 10 mg daily. - No BB with recent CGS. - Not a candidate for advanced therapies with polysubstance abuse.  - BMET, dig level and BNP today, repeat BMET next visit. - I asked him to set alarm on phone to remind him to take meds bid.   2. NSVT: - Keep K > 4.0 Mg > 2.0 - BMET and Mag today.    3. H/o Snoring: - Sleep study once has insurance.   4. Substance Use: - Cocaine/ETOH and tobacco use history  - UDS + cocaine and THC on admit . - Reports no further cocaine use. - Imperative he remains quit.   5. H/o Hypokalemia/hypomag/hyponatremia - Free water restrict. - Labs today.   6. PE/ RLE DVT - Diagnosed 08/06/21, placed on Eliquis. - Continue Eliquis. No bleeding issues. - CBC today.   7. Recent Hematochezia/ Iron Deficiency Anemia  - Felt 2/2 hemorrhoids. Noted previous admit 1/23.  - Iron sats low. Received feraheme during admission. - No further bleeding issues (see # 9)   8. Pleural effusion, right - Seen on cMRI. Small on follow up CXR - Lungs clear on exam today, 100% O2 on room air today.  9.  Hemorrhoids - Will send Rx for topical and suppository. - Try OTC Miralax for constipation  10. SDOH - Lives with grandmother. - HFSW helping with Medicaid application. - He has transportation, has a pillbox. - Meds thru HF fund. Patient assistance for Entresto, and applied for Eliquis assistance.  Follow up in 3 weeks with PharmD (check BMET and increase Entresto if able), 6 weeks with APP and 12 weeks with Dr. Haroldine Laws + echo.  Allena Katz, FNP-BC 09/14/21

## 2021-09-13 NOTE — Telephone Encounter (Signed)
Called and left patient a voice message to confirm/remind patient of their appointment at the [facility] on 09/14/21.

## 2021-09-14 ENCOUNTER — Other Ambulatory Visit (HOSPITAL_COMMUNITY): Payer: Self-pay

## 2021-09-14 ENCOUNTER — Encounter (HOSPITAL_COMMUNITY): Payer: Self-pay

## 2021-09-14 ENCOUNTER — Ambulatory Visit (HOSPITAL_COMMUNITY)
Admit: 2021-09-14 | Discharge: 2021-09-14 | Disposition: A | Discharge status: Home / Self Care | Payer: Medicaid Other | Source: Ambulatory Visit | Attending: Family Medicine | Admitting: Family Medicine

## 2021-09-14 ENCOUNTER — Other Ambulatory Visit: Payer: Self-pay

## 2021-09-14 VITALS — BP 130/90 | HR 109 | Wt 203.6 lb

## 2021-09-14 DIAGNOSIS — Z8709 Personal history of other diseases of the respiratory system: Secondary | ICD-10-CM | POA: Diagnosis not present

## 2021-09-14 DIAGNOSIS — I5022 Chronic systolic (congestive) heart failure: Secondary | ICD-10-CM | POA: Insufficient documentation

## 2021-09-14 DIAGNOSIS — Z6828 Body mass index (BMI) 28.0-28.9, adult: Secondary | ICD-10-CM | POA: Diagnosis not present

## 2021-09-14 DIAGNOSIS — Z86711 Personal history of pulmonary embolism: Secondary | ICD-10-CM | POA: Insufficient documentation

## 2021-09-14 DIAGNOSIS — Z7901 Long term (current) use of anticoagulants: Secondary | ICD-10-CM | POA: Insufficient documentation

## 2021-09-14 DIAGNOSIS — J90 Pleural effusion, not elsewhere classified: Secondary | ICD-10-CM | POA: Insufficient documentation

## 2021-09-14 DIAGNOSIS — R0602 Shortness of breath: Secondary | ICD-10-CM | POA: Insufficient documentation

## 2021-09-14 DIAGNOSIS — I472 Ventricular tachycardia, unspecified: Secondary | ICD-10-CM | POA: Diagnosis not present

## 2021-09-14 DIAGNOSIS — K921 Melena: Secondary | ICD-10-CM | POA: Diagnosis not present

## 2021-09-14 DIAGNOSIS — M7989 Other specified soft tissue disorders: Secondary | ICD-10-CM | POA: Diagnosis not present

## 2021-09-14 DIAGNOSIS — Z87891 Personal history of nicotine dependence: Secondary | ICD-10-CM | POA: Diagnosis not present

## 2021-09-14 DIAGNOSIS — I5033 Acute on chronic diastolic (congestive) heart failure: Secondary | ICD-10-CM

## 2021-09-14 DIAGNOSIS — K649 Unspecified hemorrhoids: Secondary | ICD-10-CM | POA: Insufficient documentation

## 2021-09-14 DIAGNOSIS — R0683 Snoring: Secondary | ICD-10-CM

## 2021-09-14 DIAGNOSIS — Z7984 Long term (current) use of oral hypoglycemic drugs: Secondary | ICD-10-CM | POA: Insufficient documentation

## 2021-09-14 DIAGNOSIS — Z09 Encounter for follow-up examination after completed treatment for conditions other than malignant neoplasm: Secondary | ICD-10-CM | POA: Insufficient documentation

## 2021-09-14 DIAGNOSIS — I4729 Other ventricular tachycardia: Secondary | ICD-10-CM | POA: Diagnosis not present

## 2021-09-14 DIAGNOSIS — I428 Other cardiomyopathies: Secondary | ICD-10-CM | POA: Insufficient documentation

## 2021-09-14 DIAGNOSIS — E669 Obesity, unspecified: Secondary | ICD-10-CM | POA: Diagnosis not present

## 2021-09-14 DIAGNOSIS — D509 Iron deficiency anemia, unspecified: Secondary | ICD-10-CM | POA: Diagnosis not present

## 2021-09-14 DIAGNOSIS — F1410 Cocaine abuse, uncomplicated: Secondary | ICD-10-CM

## 2021-09-14 DIAGNOSIS — Z139 Encounter for screening, unspecified: Secondary | ICD-10-CM | POA: Diagnosis not present

## 2021-09-14 DIAGNOSIS — Z79899 Other long term (current) drug therapy: Secondary | ICD-10-CM | POA: Insufficient documentation

## 2021-09-14 DIAGNOSIS — Z86718 Personal history of other venous thrombosis and embolism: Secondary | ICD-10-CM | POA: Insufficient documentation

## 2021-09-14 LAB — CBC
HCT: 33.4 % — ABNORMAL LOW (ref 39.0–52.0)
Hemoglobin: 9.9 g/dL — ABNORMAL LOW (ref 13.0–17.0)
MCH: 26.3 pg (ref 26.0–34.0)
MCHC: 29.6 g/dL — ABNORMAL LOW (ref 30.0–36.0)
MCV: 88.8 fL (ref 80.0–100.0)
Platelets: 569 10*3/uL — ABNORMAL HIGH (ref 150–400)
RBC: 3.76 MIL/uL — ABNORMAL LOW (ref 4.22–5.81)
RDW: 26.4 % — ABNORMAL HIGH (ref 11.5–15.5)
WBC: 5.5 10*3/uL (ref 4.0–10.5)
nRBC: 0.5 % — ABNORMAL HIGH (ref 0.0–0.2)

## 2021-09-14 LAB — BASIC METABOLIC PANEL
Anion gap: 9 (ref 5–15)
BUN: 7 mg/dL (ref 6–20)
CO2: 24 mmol/L (ref 22–32)
Calcium: 9.2 mg/dL (ref 8.9–10.3)
Chloride: 103 mmol/L (ref 98–111)
Creatinine, Ser: 1.26 mg/dL — ABNORMAL HIGH (ref 0.61–1.24)
GFR, Estimated: >60 mL/min (ref >60)
Glucose, Bld: 93 mg/dL (ref 70–99)
Potassium: 4.1 mmol/L (ref 3.5–5.1)
Sodium: 136 mmol/L (ref 135–145)

## 2021-09-14 LAB — BRAIN NATRIURETIC PEPTIDE: B Natriuretic Peptide: >4500 pg/mL — ABNORMAL HIGH (ref 0.0–100.0)

## 2021-09-14 LAB — MAGNESIUM: Magnesium: 2.1 mg/dL (ref 1.7–2.4)

## 2021-09-14 MED ORDER — PHENYLEPHRINE-MINERAL OIL-PET 0.25-14-74.9 % RE OINT
1.0000 "application " | TOPICAL_OINTMENT | Freq: Two times a day (BID) | RECTAL | 1 refills | Status: DC | PRN
  Filled 2021-09-14: qty 25, fill #0

## 2021-09-14 MED ORDER — POLYETHYLENE GLYCOL 3350 17 G PO PACK
17.0000 g | PACK | Freq: Every day | ORAL | 4 refills | Status: DC
  Filled 2021-09-14: qty 30, 30d supply, fill #0

## 2021-09-14 MED ORDER — HYDROCORTISONE ACETATE 25 MG RE SUPP
25.0000 mg | Freq: Two times a day (BID) | RECTAL | 0 refills | Status: DC
Start: 2021-09-14 — End: 2022-01-20
  Filled 2021-09-14 – 2021-09-26 (×2): qty 12, 6d supply, fill #0

## 2021-09-14 MED ORDER — ENTRESTO 49-51 MG PO TABS
1.0000 | ORAL_TABLET | Freq: Two times a day (BID) | ORAL | 4 refills | Status: DC
  Filled 2021-09-14: qty 60, 30d supply, fill #0

## 2021-09-14 NOTE — Patient Instructions (Addendum)
Thank you for coming in today  Labs were done today, if any labs are abnormal the clinic will call you  INCREASE Entresto to 49/51 mg 1 tablet twice daily You can double up on your current Entresto you have   You have been given a pill splitter  START Miralax 1 packet once daily   Your physician recommends that you schedule a follow-up appointment in:  3 weeks with Pharmacy 6 weeks in clinic  12 with Dr. Gala Romney  At the [facility], you and your health needs are our priority. As part of our continuing mission to provide you with exceptional heart care, we have created designated Provider Care Teams. These Care Teams include your primary Cardiologist (physician) and Advanced Practice Providers (APPs- Physician Assistants and Nurse Practitioners) who all work together to provide you with the care you need, when you need it.   You may see any of the following providers on your designated Care Team at your next follow up: Dr Arvilla Meres Dr Carron Curie, NP Robbie Lis, [place] [facility] [place], [place] Karle Plumber, PharmD   Please be sure to bring in all your medications bottles to every appointment.   If you have any questions or concerns before your next appointment please send [place] a message through [place] or call our office at [phone].    TO LEAVE A MESSAGE FOR THE NURSE SELECT OPTION 2, PLEASE LEAVE A MESSAGE INCLUDING: YOUR NAME DATE OF BIRTH CALL BACK NUMBER REASON FOR CALL**this is important as we prioritize the call backs  YOU WILL RECEIVE A CALL BACK THE SAME DAY AS LONG AS YOU CALL BEFORE 4:00 PM

## 2021-09-14 NOTE — Progress Notes (Signed)
ReDS Vest / Clip - 09/14/21 1200       ReDS Vest / Clip   Station Marker D    Ruler Value 35    ReDS Value Range Moderate volume overload    ReDS Actual Value 39

## 2021-09-16 ENCOUNTER — Ambulatory Visit: Payer: Medicaid Other | Admitting: Nurse Practitioner

## 2021-09-20 ENCOUNTER — Emergency Department (HOSPITAL_COMMUNITY): Payer: Medicaid Other | Admitting: Certified Registered Nurse Anesthetist

## 2021-09-20 ENCOUNTER — Emergency Department (HOSPITAL_COMMUNITY): Payer: Medicaid Other

## 2021-09-20 ENCOUNTER — Encounter (HOSPITAL_COMMUNITY)
Admission: EM | Disposition: A | Discharge status: Home Health | Payer: Self-pay | Source: Home / Self Care | Attending: Neurology

## 2021-09-20 ENCOUNTER — Inpatient Hospital Stay (HOSPITAL_COMMUNITY)
Admission: EM | Admit: 2021-09-20 | Discharge: 2021-09-24 | DRG: 023 | Disposition: A | Discharge status: Home Health | Payer: Medicaid Other | Attending: Internal Medicine | Admitting: Internal Medicine

## 2021-09-20 ENCOUNTER — Inpatient Hospital Stay (HOSPITAL_COMMUNITY): Payer: Medicaid Other

## 2021-09-20 ENCOUNTER — Encounter (HOSPITAL_COMMUNITY): Payer: Self-pay

## 2021-09-20 ENCOUNTER — Other Ambulatory Visit: Payer: Self-pay

## 2021-09-20 DIAGNOSIS — H518 Other specified disorders of binocular movement: Secondary | ICD-10-CM | POA: Diagnosis not present

## 2021-09-20 DIAGNOSIS — I6601 Occlusion and stenosis of right middle cerebral artery: Secondary | ICD-10-CM

## 2021-09-20 DIAGNOSIS — R262 Difficulty in walking, not elsewhere classified: Secondary | ICD-10-CM | POA: Diagnosis present

## 2021-09-20 DIAGNOSIS — R414 Neurologic neglect syndrome: Secondary | ICD-10-CM | POA: Diagnosis present

## 2021-09-20 DIAGNOSIS — Z20822 Contact with and (suspected) exposure to covid-19: Secondary | ICD-10-CM | POA: Diagnosis present

## 2021-09-20 DIAGNOSIS — Z7984 Long term (current) use of oral hypoglycemic drugs: Secondary | ICD-10-CM

## 2021-09-20 DIAGNOSIS — Z978 Presence of other specified devices: Secondary | ICD-10-CM | POA: Diagnosis not present

## 2021-09-20 DIAGNOSIS — Z86711 Personal history of pulmonary embolism: Secondary | ICD-10-CM

## 2021-09-20 DIAGNOSIS — G8194 Hemiplegia, unspecified affecting left nondominant side: Secondary | ICD-10-CM | POA: Diagnosis present

## 2021-09-20 DIAGNOSIS — D649 Anemia, unspecified: Secondary | ICD-10-CM | POA: Diagnosis present

## 2021-09-20 DIAGNOSIS — I5022 Chronic systolic (congestive) heart failure: Secondary | ICD-10-CM | POA: Diagnosis present

## 2021-09-20 DIAGNOSIS — I5021 Acute systolic (congestive) heart failure: Secondary | ICD-10-CM | POA: Diagnosis not present

## 2021-09-20 DIAGNOSIS — R791 Abnormal coagulation profile: Secondary | ICD-10-CM | POA: Diagnosis present

## 2021-09-20 DIAGNOSIS — E669 Obesity, unspecified: Secondary | ICD-10-CM | POA: Diagnosis present

## 2021-09-20 DIAGNOSIS — F1410 Cocaine abuse, uncomplicated: Secondary | ICD-10-CM | POA: Diagnosis present

## 2021-09-20 DIAGNOSIS — E119 Type 2 diabetes mellitus without complications: Secondary | ICD-10-CM | POA: Diagnosis present

## 2021-09-20 DIAGNOSIS — R29810 Facial weakness: Secondary | ICD-10-CM | POA: Diagnosis present

## 2021-09-20 DIAGNOSIS — E8809 Other disorders of plasma-protein metabolism, not elsewhere classified: Secondary | ICD-10-CM | POA: Diagnosis present

## 2021-09-20 DIAGNOSIS — Z9114 Patient's other noncompliance with medication regimen: Secondary | ICD-10-CM

## 2021-09-20 DIAGNOSIS — R29724 NIHSS score 24: Secondary | ICD-10-CM | POA: Diagnosis present

## 2021-09-20 DIAGNOSIS — F1210 Cannabis abuse, uncomplicated: Secondary | ICD-10-CM | POA: Diagnosis present

## 2021-09-20 DIAGNOSIS — I639 Cerebral infarction, unspecified: Secondary | ICD-10-CM | POA: Diagnosis not present

## 2021-09-20 DIAGNOSIS — I63413 Cerebral infarction due to embolism of bilateral middle cerebral arteries: Secondary | ICD-10-CM | POA: Diagnosis not present

## 2021-09-20 DIAGNOSIS — Z86718 Personal history of other venous thrombosis and embolism: Secondary | ICD-10-CM

## 2021-09-20 DIAGNOSIS — Z6828 Body mass index (BMI) 28.0-28.9, adult: Secondary | ICD-10-CM

## 2021-09-20 DIAGNOSIS — R4701 Aphasia: Secondary | ICD-10-CM | POA: Diagnosis not present

## 2021-09-20 DIAGNOSIS — Z0189 Encounter for other specified special examinations: Secondary | ICD-10-CM

## 2021-09-20 DIAGNOSIS — R471 Dysarthria and anarthria: Secondary | ICD-10-CM | POA: Diagnosis present

## 2021-09-20 DIAGNOSIS — R531 Weakness: Secondary | ICD-10-CM | POA: Diagnosis present

## 2021-09-20 DIAGNOSIS — Z8249 Family history of ischemic heart disease and other diseases of the circulatory system: Secondary | ICD-10-CM

## 2021-09-20 DIAGNOSIS — F17200 Nicotine dependence, unspecified, uncomplicated: Secondary | ICD-10-CM | POA: Diagnosis not present

## 2021-09-20 DIAGNOSIS — J9691 Respiratory failure, unspecified with hypoxia: Secondary | ICD-10-CM | POA: Diagnosis not present

## 2021-09-20 DIAGNOSIS — I509 Heart failure, unspecified: Secondary | ICD-10-CM

## 2021-09-20 DIAGNOSIS — G9349 Other encephalopathy: Secondary | ICD-10-CM | POA: Diagnosis not present

## 2021-09-20 DIAGNOSIS — I110 Hypertensive heart disease with heart failure: Secondary | ICD-10-CM

## 2021-09-20 DIAGNOSIS — I428 Other cardiomyopathies: Secondary | ICD-10-CM | POA: Diagnosis present

## 2021-09-20 DIAGNOSIS — I959 Hypotension, unspecified: Secondary | ICD-10-CM | POA: Diagnosis not present

## 2021-09-20 DIAGNOSIS — Z7901 Long term (current) use of anticoagulants: Secondary | ICD-10-CM

## 2021-09-20 DIAGNOSIS — F17210 Nicotine dependence, cigarettes, uncomplicated: Secondary | ICD-10-CM | POA: Diagnosis present

## 2021-09-20 DIAGNOSIS — I255 Ischemic cardiomyopathy: Secondary | ICD-10-CM | POA: Diagnosis not present

## 2021-09-20 DIAGNOSIS — Z79899 Other long term (current) drug therapy: Secondary | ICD-10-CM

## 2021-09-20 HISTORY — PX: RADIOLOGY WITH ANESTHESIA: SHX6223

## 2021-09-20 HISTORY — PX: IR PERCUTANEOUS ART THROMBECTOMY/INFUSION INTRACRANIAL INC DIAG ANGIO: IMG6087

## 2021-09-20 LAB — RAPID URINE DRUG SCREEN, HOSP PERFORMED
Amphetamines: NOT DETECTED
Barbiturates: NOT DETECTED
Benzodiazepines: NOT DETECTED
Cocaine: POSITIVE — AB
Opiates: NOT DETECTED
Tetrahydrocannabinol: POSITIVE — AB

## 2021-09-20 LAB — I-STAT CHEM 8, ED
BUN: 5 mg/dL — ABNORMAL LOW (ref 6–20)
Calcium, Ion: 1.15 mmol/L (ref 1.15–1.40)
Chloride: 101 mmol/L (ref 98–111)
Creatinine, Ser: 1.1 mg/dL (ref 0.61–1.24)
Glucose, Bld: 102 mg/dL — ABNORMAL HIGH (ref 70–99)
HCT: 38 % — ABNORMAL LOW (ref 39.0–52.0)
Hemoglobin: 12.9 g/dL — ABNORMAL LOW (ref 13.0–17.0)
Potassium: 3.9 mmol/L (ref 3.5–5.1)
Sodium: 136 mmol/L (ref 135–145)
TCO2: 26 mmol/L (ref 22–32)

## 2021-09-20 LAB — CBG MONITORING, ED: Glucose-Capillary: 110 mg/dL — ABNORMAL HIGH (ref 70–99)

## 2021-09-20 LAB — APTT: aPTT: 30 seconds (ref 24–36)

## 2021-09-20 LAB — COMPREHENSIVE METABOLIC PANEL
ALT: 14 U/L (ref 0–44)
AST: 26 U/L (ref 15–41)
Albumin: 3.1 g/dL — ABNORMAL LOW (ref 3.5–5.0)
Alkaline Phosphatase: 64 U/L (ref 38–126)
Anion gap: 9 (ref 5–15)
BUN: 5 mg/dL — ABNORMAL LOW (ref 6–20)
CO2: 23 mmol/L (ref 22–32)
Calcium: 8.9 mg/dL (ref 8.9–10.3)
Chloride: 101 mmol/L (ref 98–111)
Creatinine, Ser: 1.16 mg/dL (ref 0.61–1.24)
GFR, Estimated: >60 mL/min (ref >60)
Glucose, Bld: 103 mg/dL — ABNORMAL HIGH (ref 70–99)
Potassium: 4 mmol/L (ref 3.5–5.1)
Sodium: 133 mmol/L — ABNORMAL LOW (ref 135–145)
Total Bilirubin: 1.7 mg/dL — ABNORMAL HIGH (ref 0.3–1.2)
Total Protein: 6.7 g/dL (ref 6.5–8.1)

## 2021-09-20 LAB — CBC
HCT: 34.9 % — ABNORMAL LOW (ref 39.0–52.0)
Hemoglobin: 10.5 g/dL — ABNORMAL LOW (ref 13.0–17.0)
MCH: 27.1 pg (ref 26.0–34.0)
MCHC: 30.1 g/dL (ref 30.0–36.0)
MCV: 90.2 fL (ref 80.0–100.0)
Platelets: 345 10*3/uL (ref 150–400)
RBC: 3.87 MIL/uL — ABNORMAL LOW (ref 4.22–5.81)
RDW: 24.7 % — ABNORMAL HIGH (ref 11.5–15.5)
WBC: 8.4 10*3/uL (ref 4.0–10.5)
nRBC: 0 % (ref 0.0–0.2)

## 2021-09-20 LAB — RESP PANEL BY RT-PCR (FLU A&B, COVID) ARPGX2
Influenza A by PCR: NEGATIVE
Influenza B by PCR: NEGATIVE
SARS Coronavirus 2 by RT PCR: NEGATIVE

## 2021-09-20 LAB — DIFFERENTIAL
Abs Immature Granulocytes: 0.04 10*3/uL (ref 0.00–0.07)
Basophils Absolute: 0 10*3/uL (ref 0.0–0.1)
Basophils Relative: 0 %
Eosinophils Absolute: 0.1 10*3/uL (ref 0.0–0.5)
Eosinophils Relative: 1 %
Immature Granulocytes: 1 %
Lymphocytes Relative: 11 %
Lymphs Abs: 0.9 10*3/uL (ref 0.7–4.0)
Monocytes Absolute: 0.7 10*3/uL (ref 0.1–1.0)
Monocytes Relative: 8 %
Neutro Abs: 6.7 10*3/uL (ref 1.7–7.7)
Neutrophils Relative %: 79 %

## 2021-09-20 LAB — POCT I-STAT 7, (LYTES, BLD GAS, ICA,H+H)
Acid-base deficit: 2 mmol/L (ref 0.0–2.0)
Bicarbonate: 24.7 mmol/L (ref 20.0–28.0)
Calcium, Ion: 1.22 mmol/L (ref 1.15–1.40)
HCT: 35 % — ABNORMAL LOW (ref 39.0–52.0)
Hemoglobin: 11.9 g/dL — ABNORMAL LOW (ref 13.0–17.0)
O2 Saturation: 100 %
Patient temperature: 96.9
Potassium: 3.4 mmol/L — ABNORMAL LOW (ref 3.5–5.1)
Sodium: 137 mmol/L (ref 135–145)
TCO2: 26 mmol/L (ref 22–32)
pCO2 arterial: 47.7 mmHg (ref 32–48)
pH, Arterial: 7.317 — ABNORMAL LOW (ref 7.35–7.45)
pO2, Arterial: 279 mmHg — ABNORMAL HIGH (ref 83–108)

## 2021-09-20 LAB — GLUCOSE, CAPILLARY
Glucose-Capillary: 122 mg/dL — ABNORMAL HIGH (ref 70–99)
Glucose-Capillary: 104 mg/dL — ABNORMAL HIGH (ref 70–99)

## 2021-09-20 LAB — URINALYSIS, COMPLETE (UACMP) WITH MICROSCOPIC
Bacteria, UA: NONE SEEN
Bilirubin Urine: NEGATIVE
Glucose, UA: >=500 mg/dL — AB
Hgb urine dipstick: NEGATIVE
Ketones, ur: NEGATIVE mg/dL
Leukocytes,Ua: NEGATIVE
Nitrite: NEGATIVE
Protein, ur: NEGATIVE mg/dL
Specific Gravity, Urine: <1.005 — ABNORMAL LOW (ref 1.005–1.030)
pH: 7 (ref 5.0–8.0)

## 2021-09-20 LAB — ETHANOL: Alcohol, Ethyl (B): <10 mg/dL (ref <10)

## 2021-09-20 LAB — PROTIME-INR
INR: 1.3 — ABNORMAL HIGH (ref 0.8–1.2)
Prothrombin Time: 16.5 seconds — ABNORMAL HIGH (ref 11.4–15.2)

## 2021-09-20 LAB — MRSA NEXT GEN BY PCR, NASAL: MRSA by PCR Next Gen: NOT DETECTED

## 2021-09-20 SURGERY — IR WITH ANESTHESIA
Anesthesia: General

## 2021-09-20 MED ORDER — POTASSIUM CHLORIDE 20 MEQ PO PACK: 20.0000 meq | PACK | Freq: Every day | ORAL | Status: DC

## 2021-09-20 MED ORDER — NITROGLYCERIN 5 MG/ML IV SOLN
INTRAVENOUS | Status: AC | PRN
  Administered 2021-09-20 (×7): 25 ug via INTRA_ARTERIAL

## 2021-09-20 MED ORDER — SODIUM CHLORIDE 0.9% FLUSH: 3.0000 mL | Freq: Once | INTRAVENOUS | Status: DC

## 2021-09-20 MED ORDER — CHLORHEXIDINE GLUCONATE 0.12% ORAL RINSE (MEDLINE KIT)
15.0000 mL | Freq: Two times a day (BID) | OROMUCOSAL | Status: DC
  Administered 2021-09-20 – 2021-09-24 (×5): 15 mL via OROMUCOSAL

## 2021-09-20 MED ORDER — FUROSEMIDE 40 MG PO TABS
40.0000 mg | ORAL_TABLET | Freq: Every day | ORAL | Status: DC
  Administered 2021-09-20: 40 mg
  Filled 2021-09-20: qty 1

## 2021-09-20 MED ORDER — POLYETHYLENE GLYCOL 3350 17 G PO PACK: 17.0000 g | PACK | Freq: Every day | ORAL | Status: DC

## 2021-09-20 MED ORDER — IOHEXOL 300 MG/ML  SOLN
100.0000 mL | Freq: Once | INTRAMUSCULAR | Status: AC | PRN
  Administered 2021-09-20: 115 mL via INTRA_ARTERIAL

## 2021-09-20 MED ORDER — NITROGLYCERIN 1 MG/10 ML FOR IR/CATH LAB
INTRA_ARTERIAL | Status: AC
  Filled 2021-09-20: qty 10

## 2021-09-20 MED ORDER — PROPOFOL 1000 MG/100ML IV EMUL
0.0000 ug/kg/min | INTRAVENOUS | Status: DC
  Administered 2021-09-20: 10 ug/kg/min via INTRAVENOUS
  Administered 2021-09-20: 20 ug/kg/min via INTRAVENOUS
  Administered 2021-09-21: 40 ug/kg/min via INTRAVENOUS
  Filled 2021-09-20 (×4): qty 100

## 2021-09-20 MED ORDER — INSULIN ASPART 100 UNIT/ML IJ SOLN
0.0000 [IU] | INTRAMUSCULAR | Status: DC
  Administered 2021-09-20 – 2021-09-21 (×2): 2 [IU] via SUBCUTANEOUS

## 2021-09-20 MED ORDER — SPIRONOLACTONE 25 MG PO TABS: 25.0000 mg | ORAL_TABLET | Freq: Every day | ORAL | Status: DC

## 2021-09-20 MED ORDER — SODIUM CHLORIDE 0.9 % IV SOLN: INTRAVENOUS | Status: DC

## 2021-09-20 MED ORDER — FENTANYL CITRATE PF 50 MCG/ML IJ SOSY
50.0000 ug | PREFILLED_SYRINGE | INTRAMUSCULAR | Status: DC | PRN
  Administered 2021-09-21: 50 ug via INTRAVENOUS
  Filled 2021-09-20 (×3): qty 1

## 2021-09-20 MED ORDER — DOPAMINE-DEXTROSE 1.6-5 MG/ML-% IV SOLN
INTRAVENOUS | Status: DC | PRN
  Administered 2021-09-20: 5 ug/kg/min via INTRAVENOUS

## 2021-09-20 MED ORDER — FENTANYL CITRATE (PF) 250 MCG/5ML IJ SOLN
INTRAMUSCULAR | Status: DC | PRN
  Administered 2021-09-20: 100 ug via INTRAVENOUS

## 2021-09-20 MED ORDER — DOPAMINE-DEXTROSE 3.2-5 MG/ML-% IV SOLN
0.0000 ug/kg/min | INTRAVENOUS | Status: DC
  Filled 2021-09-20: qty 250

## 2021-09-20 MED ORDER — SPIRONOLACTONE 25 MG PO TABS
25.0000 mg | ORAL_TABLET | Freq: Every day | ORAL | Status: DC
  Administered 2021-09-20: 25 mg
  Filled 2021-09-20: qty 1

## 2021-09-20 MED ORDER — SENNOSIDES-DOCUSATE SODIUM 8.6-50 MG PO TABS: 1.0000 | ORAL_TABLET | Freq: Every evening | ORAL | Status: DC | PRN

## 2021-09-20 MED ORDER — EPINEPHRINE HCL 5 MG/250ML IV SOLN IN NS
0.5000 ug/min | INTRAVENOUS | Status: DC
  Filled 2021-09-20: qty 250

## 2021-09-20 MED ORDER — DIGOXIN 125 MCG PO TABS
0.1250 mg | ORAL_TABLET | Freq: Every day | ORAL | Status: DC
Start: 2021-09-20 — End: 2021-09-24
  Administered 2021-09-20 – 2021-09-23 (×4): 0.125 mg
  Filled 2021-09-20 (×6): qty 1

## 2021-09-20 MED ORDER — FENTANYL CITRATE PF 50 MCG/ML IJ SOSY
50.0000 ug | PREFILLED_SYRINGE | INTRAMUSCULAR | Status: DC | PRN
  Administered 2021-09-20: 100 ug via INTRAVENOUS
  Administered 2021-09-20 – 2021-09-21 (×2): 50 ug via INTRAVENOUS
  Administered 2021-09-22: 100 ug via INTRAVENOUS
  Filled 2021-09-20 (×2): qty 2
  Filled 2021-09-20: qty 1

## 2021-09-20 MED ORDER — SUCCINYLCHOLINE CHLORIDE 200 MG/10ML IV SOSY
PREFILLED_SYRINGE | INTRAVENOUS | Status: DC | PRN
  Administered 2021-09-20: 180 mg via INTRAVENOUS

## 2021-09-20 MED ORDER — ACETAMINOPHEN 160 MG/5ML PO SOLN: 650.0000 mg | ORAL | Status: DC | PRN

## 2021-09-20 MED ORDER — DOCUSATE SODIUM 50 MG/5ML PO LIQD
100.0000 mg | Freq: Two times a day (BID) | ORAL | Status: DC
  Administered 2021-09-20 – 2021-09-21 (×3): 100 mg
  Filled 2021-09-20 (×3): qty 10

## 2021-09-20 MED ORDER — ACETAMINOPHEN 325 MG PO TABS: 650.0000 mg | ORAL_TABLET | ORAL | Status: DC | PRN

## 2021-09-20 MED ORDER — SACUBITRIL-VALSARTAN 49-51 MG PO TABS
1.0000 | ORAL_TABLET | Freq: Two times a day (BID) | ORAL | Status: DC
  Administered 2021-09-20: 1
  Filled 2021-09-20 (×2): qty 1

## 2021-09-20 MED ORDER — SACUBITRIL-VALSARTAN 49-51 MG PO TABS
1.0000 | ORAL_TABLET | Freq: Two times a day (BID) | ORAL | Status: DC
  Filled 2021-09-20: qty 1

## 2021-09-20 MED ORDER — CLEVIDIPINE BUTYRATE 0.5 MG/ML IV EMUL: 0.0000 mg/h | INTRAVENOUS | Status: AC

## 2021-09-20 MED ORDER — POLYETHYLENE GLYCOL 3350 17 G PO PACK
17.0000 g | PACK | Freq: Every day | ORAL | Status: DC
  Administered 2021-09-20 – 2021-09-23 (×3): 17 g
  Filled 2021-09-20 (×4): qty 1

## 2021-09-20 MED ORDER — DIGOXIN 125 MCG PO TABS
0.1250 mg | ORAL_TABLET | Freq: Every day | ORAL | Status: DC
  Filled 2021-09-20: qty 1

## 2021-09-20 MED ORDER — ACETAMINOPHEN 650 MG RE SUPP: 650.0000 mg | RECTAL | Status: DC | PRN

## 2021-09-20 MED ORDER — EPHEDRINE SULFATE-NACL 50-0.9 MG/10ML-% IV SOSY
PREFILLED_SYRINGE | INTRAVENOUS | Status: DC | PRN
  Administered 2021-09-20 (×2): 5 mg via INTRAVENOUS

## 2021-09-20 MED ORDER — ONDANSETRON HCL 4 MG/2ML IJ SOLN
INTRAMUSCULAR | Status: DC | PRN
  Administered 2021-09-20: 4 mg via INTRAVENOUS
  Administered 2021-09-20: 5 mg via INTRAVENOUS

## 2021-09-20 MED ORDER — ORAL CARE MOUTH RINSE
15.0000 mL | OROMUCOSAL | Status: DC
  Administered 2021-09-20 – 2021-09-22 (×18): 15 mL via OROMUCOSAL

## 2021-09-20 MED ORDER — STROKE: EARLY STAGES OF RECOVERY BOOK
Freq: Once | Status: AC
  Filled 2021-09-20: qty 1

## 2021-09-20 MED ORDER — IOHEXOL 350 MG/ML SOLN
100.0000 mL | Freq: Once | INTRAVENOUS | Status: AC | PRN
  Administered 2021-09-20: 100 mL via INTRAVENOUS

## 2021-09-20 MED ORDER — PROPOFOL 10 MG/ML IV BOLUS
INTRAVENOUS | Status: DC | PRN
  Administered 2021-09-20: 200 mg via INTRAVENOUS

## 2021-09-20 MED ORDER — SODIUM CHLORIDE 0.9 % IV SOLN
INTRAVENOUS | Status: DC | PRN
  Administered 2021-09-20: 3 ug/min via INTRAVENOUS

## 2021-09-20 MED ORDER — PANTOPRAZOLE SODIUM 40 MG IV SOLR
40.0000 mg | Freq: Every day | INTRAVENOUS | Status: DC
Start: 2021-09-20 — End: 2021-09-21
  Administered 2021-09-20: 40 mg via INTRAVENOUS
  Filled 2021-09-20: qty 10

## 2021-09-20 MED ORDER — CEFAZOLIN SODIUM-DEXTROSE 2-3 GM-%(50ML) IV SOLR
INTRAVENOUS | Status: DC | PRN
  Administered 2021-09-20: 2 g via INTRAVENOUS

## 2021-09-20 MED ORDER — ACETAMINOPHEN 160 MG/5ML PO SOLN
650.0000 mg | ORAL | Status: DC | PRN
  Administered 2021-09-24: 650 mg
  Filled 2021-09-20: qty 20.3

## 2021-09-20 MED ORDER — SODIUM CHLORIDE 0.9 % IV SOLN: INTRAVENOUS | Status: DC | PRN

## 2021-09-20 MED ORDER — ALBUTEROL SULFATE (2.5 MG/3ML) 0.083% IN NEBU: 3.0000 mL | INHALATION_SOLUTION | Freq: Four times a day (QID) | RESPIRATORY_TRACT | Status: DC | PRN

## 2021-09-20 MED ORDER — PHENYLEPHRINE HCL-NACL 20-0.9 MG/250ML-% IV SOLN
INTRAVENOUS | Status: DC | PRN
Start: 2021-09-20 — End: 2021-09-20
  Administered 2021-09-20: 20 ug/min via INTRAVENOUS

## 2021-09-20 MED ORDER — ACETAMINOPHEN 325 MG PO TABS
650.0000 mg | ORAL_TABLET | ORAL | Status: DC | PRN
Start: 2021-09-20 — End: 2021-09-24

## 2021-09-20 MED ORDER — NOREPINEPHRINE 4 MG/250ML-% IV SOLN
2.0000 ug/min | INTRAVENOUS | Status: DC
  Administered 2021-09-20: 22:00:00 10 ug/min via INTRAVENOUS
  Administered 2021-09-20: 17:00:00 2 ug/min via INTRAVENOUS
  Administered 2021-09-21: 7 ug/min via INTRAVENOUS
  Administered 2021-09-21: 2 ug/min via INTRAVENOUS
  Filled 2021-09-20 (×4): qty 250

## 2021-09-20 MED ORDER — ROCURONIUM BROMIDE 10 MG/ML (PF) SYRINGE
PREFILLED_SYRINGE | INTRAVENOUS | Status: DC | PRN
  Administered 2021-09-20: 100 mg via INTRAVENOUS
  Administered 2021-09-20: 50 mg via INTRAVENOUS

## 2021-09-20 MED ORDER — LIDOCAINE 2% (20 MG/ML) 5 ML SYRINGE
INTRAMUSCULAR | Status: DC | PRN
  Administered 2021-09-20: 40 mg via INTRAVENOUS

## 2021-09-20 MED ORDER — CHLORHEXIDINE GLUCONATE CLOTH 2 % EX PADS
6.0000 | MEDICATED_PAD | Freq: Every day | CUTANEOUS | Status: DC
  Administered 2021-09-20 – 2021-09-24 (×4): 6 via TOPICAL

## 2021-09-20 MED ORDER — FUROSEMIDE 40 MG PO TABS: 40.0000 mg | ORAL_TABLET | Freq: Every day | ORAL | Status: DC

## 2021-09-20 MED ORDER — CEFAZOLIN SODIUM-DEXTROSE 2-4 GM/100ML-% IV SOLN
INTRAVENOUS | Status: AC
  Filled 2021-09-20: qty 100

## 2021-09-20 MED ORDER — FENTANYL CITRATE (PF) 100 MCG/2ML IJ SOLN
INTRAMUSCULAR | Status: AC
  Filled 2021-09-20: qty 2

## 2021-09-20 MED ORDER — LORAZEPAM 2 MG/ML IJ SOLN
2.0000 mg | Freq: Once | INTRAMUSCULAR | Status: AC
  Administered 2021-09-20: 2 mg via INTRAVENOUS

## 2021-09-20 MED ORDER — PROPOFOL 500 MG/50ML IV EMUL
INTRAVENOUS | Status: DC | PRN
  Administered 2021-09-20: 50 ug/kg/min via INTRAVENOUS

## 2021-09-20 MED ORDER — SODIUM CHLORIDE 0.9 % IV SOLN: 250.0000 mL | INTRAVENOUS | Status: DC

## 2021-09-20 MED ORDER — POTASSIUM CHLORIDE 20 MEQ PO PACK
20.0000 meq | PACK | Freq: Every day | ORAL | Status: DC
  Administered 2021-09-20 – 2021-09-23 (×4): 20 meq
  Filled 2021-09-20 (×4): qty 1

## 2021-09-20 NOTE — CV Procedure (Signed)
INR. 26 Y RT H M MRS ?0 to 1 LSW 22.00 yesterday . New onset of Rt gaze deviation,confusion ,llt facial weakness and lt sided weakness. ?global aphasia and lt sided neglect. CT brain No ICH. ASPECTS 10 CTA occluded sup division RT MCA.prox. CTP core of zero and Tmax>6s of 17 ml.  Endovascular treatment D/W mother over the phone in a 3 way call. Procedure,reasons ,risks and alternatives reviewed. Risks of ICH of 10 % ,worsening neuro deficit,death and inability to revascularize discussed.  Mother expressed understanding and gave consent to the treatment.  S.Haidan Nhan MD

## 2021-09-20 NOTE — Progress Notes (Incomplete)
NAME:  Carlos Mckinney, MRN:  [id number], DOB:  1996/05/30, LOS: 0 ADMISSION DATE:  09/20/2021, CONSULTATION DATE:  09/20/21 REFERRING MD:  ***, CHIEF COMPLAINT:  ***   History of Present Illness:  Carlos Mckinney is a 26 y.o. male arriving to [facility] ED via [facility] on 09/20/2021 with past medical hx of CHF w/ REF, cardiogenic shock, HTN, DVT, tobacco use, cocaine abuse, and obesity. On Eliquis (apixaban) daily w/ poor adherence. Code stroke was activated by EMS.    Patient from home where he was LKW at 2200 on 09/19/2021 and now complaining of not answering questions, left sided weakness, acting strange per family. Patient reports that he was at his baseline last night when he went to bed at 2200. At 0400, his family heard him fall and he got back in bed. At 8 this morning, acting slightly abnormal and then had stark change around 1100.    Stroke team at the bedside on patient arrival. Labs drawn and patient cleared for CT by Dr. Anitra Lauth. Patient to CT with team. NIHSS 24, see documentation for details and code stroke times. Patient with decreased LOC, disoriented, not following commands, right gaze preference , left hemianopia, left facial droop, left arm weakness, left leg weakness, left decreased sensation, Global aphasia , and Sensory  neglect on exam. The following imaging was completed:  CT, CTA head and neck, CTP. Patient is not a candidate for IV Thrombolytic due to being on Eliquis.  CT angio head neck wo cm: Motion limited with RAPID reporting areas of penumbra in the rightanterior MCA territory and the left posterior MCA territory. Overall penumbra is estimated to be 17 mL. No core infarct identified.   Patient taken to IR. RT CFA approach. Findings. 1.Occluded RT MCA sup division prox. S/P cpmplete revascularization of the sup division with x 1 pass with solitaire 40 mm retriever and contact aspiration achieving a TICI 2C revascularization. Post CT brain no ICH  distal  pulses intact. Patient left intubated as unresponsive prior to the procedure.   Patient transferred to Neuro ICU  Pertinent  Medical History  ED visit 11/2020 with cough, dyspnea, tachycardia/tachypnea 08/22 Hospitalized at [facility] and transferred to [facility] for acute systolic HF. Echo 08/22 with EF 15-20%, mildly dilated RV with normal RV function, mild MR, moderate TR, PAP 40-45 mmHg. Established care 05/02/21, mild volume overload and NYHA II-III. Lasix increased. Seen in ED 05/24/21 for CP and swelling, left without being seen 08/06/21  presenting to the ED with leg swelling. Venous doppler reveals acute DVT involving right posterior tibial veins and right soleal veins. CTA reveals small subsegmental PE in both upper lobes and the right lower lobe with associated small pulmonary infarcts.  Direct admit from clinic 2/23 for a/c CHF and cardiogenic shock co-ox 24% cMRI - with severe LVEF 12% and RV 18%. Left AMA with CoOx 40%    Significant Hospital Events: Including procedures, antibiotic start and stop dates in addition to other pertinent events   2/21 IR - cpmplete revascularization of the sup division with x 1 pass with solitaire 40 mm retriever and contact aspiration achieving a TICI 2C revascularization.  Interim History / Subjective:  ***  Objective   Blood pressure 123/82, pulse 94, resp. rate 15, height 5\' 11"  (1.803 m), weight 92 kg, SpO2 100 %.    Vent Mode: PRVC FiO2 (%):  [100 %] 100 % Set Rate:  [15 bmp] 15 bmp Vt Set:  [600  mL] 600 mL PEEP:  [5 cmH20] 5 cmH20 Plateau Pressure:  [21 cmH20] 21 cmH20   Intake/Output Summary (Last 24 hours) at 09/20/2021 1501 Last data filed at 09/20/2021 1450 Gross per 24 hour  Intake 600 ml  Output 250 ml  Net 350 ml   Filed Weights   09/20/21 1100  Weight: 92 kg    Examination: General: Ill appearing young male, intubated, sedated, and paralyzed s/p IR HENT: Intubated, heavy clear secretions around  ETT Lungs: Mechanical lung sounds with rhonchi bilat Cardiovascular: *** Abdomen: *** Extremities: *** Neuro: pupils mildly constricted and reactive to light, negative corneal reflex GU: ***  Resolved Hospital Problem list   ***  Assessment & Plan:  ***  Best Practice (right click and "Reselect all SmartList Selections" daily)   Diet/type: {diet type:25684} DVT prophylaxis: {anticoagulation (Optional):25687} GI prophylaxis: [id number] Lines: {Central Venous Access:25771} Foley:  {Central Venous Access:25691} Code Status:  {Code Status:26939} Last date of multidisciplinary goals of care discussion [***]  Labs   CBC: Recent Labs  Lab 09/14/21 1211 09/20/21 1149 09/20/21 1150  WBC 5.5  --  8.4  NEUTROABS  --   --  6.7  HGB 9.9* 12.9* 10.5*  HCT 33.4* 38.0* 34.9*  MCV 88.8  --  90.2  PLT 569*  --  123456    Basic Metabolic Panel: Recent Labs  Lab 09/14/21 1211 09/20/21 1149 09/20/21 1150  NA 136 136 133*  K 4.1 3.9 4.0  CL 103 101 101  CO2 24  --  23  GLUCOSE 93 102* 103*  BUN 7 5* 5*  CREATININE 1.26* 1.10 1.16  CALCIUM 9.2  --  8.9  MG 2.1  --   --    GFR: Estimated Creatinine Clearance: 110.9 mL/min (by C-G formula based on SCr of 1.16 mg/dL). Recent Labs  Lab 09/14/21 1211 09/20/21 1150  WBC 5.5 8.4    Liver Function Tests: Recent Labs  Lab 09/20/21 1150  AST 26  ALT 14  ALKPHOS 64  BILITOT 1.7*  PROT 6.7  ALBUMIN 3.1*   No results for input(s): LIPASE, AMYLASE in the last 168 hours. No results for input(s): AMMONIA in the last 168 hours.  ABG    Component Value Date/Time   TCO2 26 09/20/2021 1149   O2SAT 40.7 09/07/2021 0413     Coagulation Profile: Recent Labs  Lab 09/20/21 1150  INR 1.3*    Cardiac Enzymes: No results for input(s): CKTOTAL, CKMB, CKMBINDEX, TROPONINI in the last 168 hours.  HbA1C: Hgb A1c MFr Bld  Date/Time Value Ref Range Status  09/04/2021 04:47 AM 4.5 (L) 4.8 - 5.6 % Final    Comment:     (NOTE) Pre diabetes:          5.7%-6.4%  Diabetes:              >6.4%  Glycemic control for   <7.0% adults with diabetes     CBG: Recent Labs  Lab 09/20/21 1139  GLUCAP 110*    Review of Systems:   Per HPI  Past Medical History:  He,  has a past medical history of Heart failure ([place]), Hypertension, and Polysubstance abuse ([place]).   Surgical History:  History reviewed. No pertinent surgical history.   Social History:   reports that he has quit smoking. His smoking use included cigarettes. He has never used smokeless tobacco. He reports that he does not drink alcohol and does not use drugs.   Family History:  His family history includes Hypertension  in his mother.   Allergies No Known Allergies   Home Medications  Prior to Admission medications   Medication Sig Start Date End Date Taking? Authorizing Provider  albuterol (VENTOLIN HFA) 108 (90 Base) MCG/ACT inhaler Inhale 1-2 puffs into the lungs every 6 (six) hours as needed for wheezing or shortness of breath.    [provider]  apixaban (ELIQUIS) 5 MG TABS tablet Take 1 tablet (5 mg total) by mouth 2 (two) times daily. 09/06/21   Clegg, Amy D, NP  digoxin (LANOXIN) 0.125 MG tablet Take 1 tablet (0.125 mg total) by mouth daily. 09/07/21   Clegg, Amy D, NP  empagliflozin (JARDIANCE) 10 MG TABS tablet Take 1 tablet (10 mg total) by mouth daily. 09/07/21   Clegg, Amy D, NP  furosemide (LASIX) 40 MG tablet Take 1 tablet (40 mg total) by mouth daily. 09/06/21   Clegg, Amy D, NP  hydrocortisone (ANUSOL-HC) 25 MG suppository Place 1 suppository (25 mg total) rectally 2 (two) times daily. 09/14/21   [place], Maricela Bo, FNP  phenylephrine-shark liver oil-mineral oil-petrolatum (PREPARATION H) 0.25-14-74.9 % rectal ointment Place 1 application rectally 2 (two) times daily as needed for hemorrhoids. 09/14/21   [place], Maricela Bo, FNP  polyethylene glycol (MIRALAX / GLYCOLAX) 17 g packet Take 17 g by mouth daily. 09/14/21   [place],  Maricela Bo, FNP  potassium chloride (KLOR-CON) 20 MEQ packet Use 1 packet (20 mEq) by mouth daily. 09/06/21   Clegg, Amy D, NP  sacubitril-valsartan (ENTRESTO) 49-51 MG Take 1 tablet by mouth 2 (two) times daily. 09/14/21   Rafael Bihari, FNP  spironolactone (ALDACTONE) 25 MG tablet Take 1 tablet (25 mg total) by mouth daily. 09/07/21   [place], NP     Critical care time: ***

## 2021-09-20 NOTE — Progress Notes (Signed)
PT Cancellation Note  Patient Details Name: Carlos Mckinney MRN: [id number] DOB: 17-Apr-1996   Cancelled Treatment:    Reason Eval/Treat Not Completed: Patient not medically ready. Will check back tomorrow.   Lyanne Co, PT  Acute Rehab Services  Pager [phone] Office [phone]    [name] [name] 09/20/2021, 4:47 PM

## 2021-09-20 NOTE — ED Triage Notes (Signed)
Pt arrived via [facility] from home as a Code Stroke for left arm weakness, aphasia, left sided facial droop and AMS. Per EMS, Pt's family said pt fell at 0400 today and was lethargic after fall, but normal per family. Pt's LKW 2100 last night. Per EMS, pt won't follow commands. Pt arrived lethargic, not following commands. VSS

## 2021-09-20 NOTE — Progress Notes (Signed)
Pt belongings at admission:  T shirt Socks Shorts One yellow colored earring

## 2021-09-20 NOTE — Anesthesia Procedure Notes (Signed)
Procedure Name: Intubation Date/Time: 09/20/2021 12:40 PM Performed by: Thelma Comp, CRNA Pre-anesthesia Checklist: Patient identified, Emergency Drugs available, Suction available and Patient being monitored Patient Re-evaluated:Patient Re-evaluated prior to induction Oxygen Delivery Method: Circle System Utilized Preoxygenation: Pre-oxygenation with 100% oxygen Induction Type: IV induction, Rapid sequence and Cricoid Pressure applied Laryngoscope Size: Mac and 4 Grade View: Grade I Tube type: Oral Tube size: 7.5 mm Number of attempts: 1 Airway Equipment and Method: Stylet Placement Confirmation: ETT inserted through vocal cords under direct vision, positive ETCO2 and breath sounds checked- equal and bilateral Secured at: 23 cm Tube secured with: Tape Dental Injury: Teeth and Oropharynx as per pre-operative assessment

## 2021-09-20 NOTE — Anesthesia Procedure Notes (Signed)
Arterial Line Insertion Start/End2/21/2023 12:42 PM, 09/20/2021 12:46 PM Performed by: Jairo Ben, MD, anesthesiologist  Patient location: Pre-op. Preanesthetic checklist: patient identified, IV checked, site marked, risks and benefits discussed, surgical consent, monitors and equipment checked, pre-op evaluation, timeout performed and anesthesia consent Lidocaine 1% used for infiltration Left, radial was placed Catheter size: 20 G Hand hygiene performed  and maximum sterile barriers used   Attempts: 1 Procedure performed without using ultrasound guided technique. Following insertion, dressing applied and Biopatch. Post procedure assessment: normal and unchanged  Patient tolerated the procedure well with no immediate complications.

## 2021-09-20 NOTE — Procedures (Signed)
INR  S/P Rt common carotid arteriogram RT CFA approach. Findings. 1.Occluded RT MCA sup division prox. S/P cpmplete revascularization of the sup division with x 1 pass with solitaire 40 mm retriever and contact aspiration achieving a TICI 2C revascularization. Post CT brain no ICH  distal pulses intact. Patient left intubated as unresponsive prior to the procedure. S.Adella Manolis MD

## 2021-09-20 NOTE — ED Provider Notes (Signed)
[facility] EMERGENCY DEPARTMENT Provider Note   CSN: [id number] Arrival date & time: 09/20/21  1136     History  Chief Complaint  Patient presents with   Code Stroke    DASHUN BORRE is a 26 y.o. male.  Patient presents as code stroke from EMS.  Patient is on multiple medications with heart failure history, substance abuse history in the note.  High blood pressure history unable to get details from patient due to acuity of presentation.  Patient unable to tell onset time. Chart review reveals he is on digoxin and Eliquis, unsure last dose.  Per EMS patient has left arm and leg weakness and neglect.      Home Medications Prior to Admission medications   Medication Sig Start Date End Date Taking? Authorizing Provider  albuterol (VENTOLIN HFA) 108 (90 Base) MCG/ACT inhaler Inhale 1-2 puffs into the lungs every 6 (six) hours as needed for wheezing or shortness of breath.    [provider]  apixaban (ELIQUIS) 5 MG TABS tablet Take 1 tablet (5 mg total) by mouth 2 (two) times daily. 09/06/21   Clegg, Amy D, NP  digoxin (LANOXIN) 0.125 MG tablet Take 1 tablet (0.125 mg total) by mouth daily. 09/07/21   Clegg, Amy D, NP  empagliflozin (JARDIANCE) 10 MG TABS tablet Take 1 tablet (10 mg total) by mouth daily. 09/07/21   Clegg, Amy D, NP  furosemide (LASIX) 40 MG tablet Take 1 tablet (40 mg total) by mouth daily. 09/06/21   Clegg, Amy D, NP  hydrocortisone (ANUSOL-HC) 25 MG suppository Place 1 suppository (25 mg total) rectally 2 (two) times daily. 09/14/21   [place]ford, Maricela Bo, FNP  phenylephrine-shark liver oil-mineral oil-petrolatum (PREPARATION H) 0.25-14-74.9 % rectal ointment Place 1 application rectally 2 (two) times daily as needed for hemorrhoids. 09/14/21   Milford, Maricela Bo, FNP  polyethylene glycol (MIRALAX / GLYCOLAX) 17 g packet Take 17 g by mouth daily. 09/14/21   Milford, Maricela Bo, FNP  potassium chloride (KLOR-CON) 20 MEQ packet Use 1 packet (20 mEq) by  mouth daily. 09/06/21   Clegg, Amy D, NP  sacubitril-valsartan (ENTRESTO) 49-51 MG Take 1 tablet by mouth 2 (two) times daily. 09/14/21   Rafael Bihari, FNP  spironolactone (ALDACTONE) 25 MG tablet Take 1 tablet (25 mg total) by mouth daily. 09/07/21   Darrick Grinder D, NP      Allergies    Patient has no known allergies.    Review of Systems   Review of Systems  Unable to perform ROS: Acuity of condition   Physical Exam Updated Vital Signs Wt 92 kg    SpO2 99%    BMI 28.29 kg/m  Physical Exam Vitals and nursing note reviewed.  Constitutional:      General: He is in acute distress.     Appearance: He is well-developed.  HENT:     Head: Normocephalic and atraumatic.     Nose: No congestion.     Mouth/Throat:     Mouth: Mucous membranes are moist.  Eyes:     General:        Right eye: No discharge.        Left eye: No discharge.  Neck:     Trachea: No tracheal deviation.  Cardiovascular:     Rate and Rhythm: Normal rate.  Pulmonary:     Effort: Pulmonary effort is normal.  Abdominal:     General: There is no distension.     Palpations: Abdomen is  soft.     Tenderness: There is no abdominal tenderness. There is no guarding.  Musculoskeletal:        General: No deformity.     Cervical back: Normal range of motion. No rigidity.  Skin:    General: Skin is warm.     Capillary Refill: Capillary refill takes less than 2 seconds.     Findings: No rash.  Neurological:     Mental Status: He is alert.     GCS: GCS eye subscore is 4. GCS verbal subscore is 4. GCS motor subscore is 6.     Cranial Nerves: Cranial nerve deficit present.     Comments: Patient has preference to the right, neglecting left side, minimal verbal, weakness left arm greater than left leg.  Psychiatric:     Comments: Minimal verbal, looking to right, no agitation    ED Results / Procedures / Treatments   Labs (all labs ordered are listed, but only abnormal results are displayed) Labs Reviewed   PROTIME-INR - Abnormal; Notable for the following components:      Result Value   Prothrombin Time 16.5 (*)    INR 1.3 (*)    All other components within normal limits  CBC - Abnormal; Notable for the following components:   RBC 3.87 (*)    Hemoglobin 10.5 (*)    HCT 34.9 (*)    RDW 24.7 (*)    All other components within normal limits  COMPREHENSIVE METABOLIC PANEL - Abnormal; Notable for the following components:   Sodium 133 (*)    Glucose, Bld 103 (*)    BUN 5 (*)    Albumin 3.1 (*)    Total Bilirubin 1.7 (*)    All other components within normal limits  I-STAT CHEM 8, ED - Abnormal; Notable for the following components:   BUN 5 (*)    Glucose, Bld 102 (*)    Hemoglobin 12.9 (*)    HCT 38.0 (*)    All other components within normal limits  CBG MONITORING, ED - Abnormal; Notable for the following components:   Glucose-Capillary 110 (*)    All other components within normal limits  APTT  DIFFERENTIAL    EKG None  Radiology CT HEAD CODE STROKE WO CONTRAST  Result Date: 09/20/2021 CLINICAL DATA:  Code stroke.  Neuro deficit, acute, stroke suspected EXAM: CT HEAD WITHOUT CONTRAST TECHNIQUE: Contiguous axial images were obtained from the base of the skull through the vertex without intravenous contrast. RADIATION DOSE REDUCTION: This exam was performed according to the departmental dose-optimization program which includes automated exposure control, adjustment of the mA and/or kV according to patient size and/or use of iterative reconstruction technique. COMPARISON:  None. FINDINGS: Mildly motion limited study.  Within this limitation: Brain: No evidence of acute infarction, hemorrhage, hydrocephalus, extra-axial collection or mass lesion/mass effect. Vascular: No hyperdense vessel identified. Skull: No acute fracture. Sinuses/Orbits: Bilateral inferior maxillary sinus mucosal thickening. Unremarkable orbits. Other: No mastoid effusions. ASPECTS [facility] Stroke Program Early CT  Score) total score (0-10 with 10 being normal): 10. IMPRESSION: No evidence of acute intracranial abnormality on this mildly motion limited study. Code stroke imaging results were communicated on 09/20/2021 at 11:57 am to provider Dr. Cheral Marker via secure text paging. Electronically Signed   By: Margaretha Sheffield M.D.   On: 09/20/2021 11:58    Procedures Procedures   CRITICAL CARE Performed by: Mariea Clonts   Total critical care time: 30 minutes  Critical care time was exclusive of separately  billable procedures and treating other patients.  Critical care was necessary to treat or prevent imminent or life-threatening deterioration.  Critical care was time spent personally by me on the following activities: development of treatment plan with patient and/or surrogate as well as nursing, discussions with consultants, evaluation of patient's response to treatment, examination of patient, obtaining history from patient or surrogate, ordering and performing treatments and interventions, ordering and review of laboratory studies, ordering and review of radiographic studies, pulse oximetry and re-evaluation of patient's condition.  Medications Ordered in ED Medications  sodium chloride flush (NS) 0.9 % injection 3 mL (has no administration in time range)  ceFAZolin (ANCEF) 2-4 GM/100ML-% IVPB (has no administration in time range)  nitroGLYCERIN 100 mcg/mL intra-arterial injection (has no administration in time range)  fentaNYL (SUBLIMAZE) 100 MCG/2ML injection (has no administration in time range)  iohexol (OMNIPAQUE) 350 MG/ML injection 100 mL (100 mLs Intravenous Contrast Given 09/20/21 1213)    ED Course/ Medical Decision Making/ A&P                           Medical Decision Making Amount and/or Complexity of Data Reviewed Labs: ordered. Radiology: ordered.  Risk Decision regarding hospitalization.   Patient presents as code stroke, met neurology on arrival in registration.  Patient  has clinical concern for acute stroke ischemic, hemorrhagic also on differential given patient on Eliquis.  Specific details not known at this time.  Patient taking emergently to CT scanner and being monitored closely.  CT scan head result reviewed independently no acute hemorrhage.  Neurology ordered CT angiogram and possible plan for IR.  Blood work starting to return hemoglobin 10.5, normal glucose, platelets normal. Plan for admission.        Final Clinical Impression(s) / ED Diagnoses Final diagnoses:  Acute ischemic stroke ([place])  Left-sided weakness    Rx / DC Orders ED Discharge Orders     None         Elnora Morrison, MD 09/26/21 0006

## 2021-09-20 NOTE — H&P (Addendum)
NEURO HOSPITALIST CONSULT NOTE   Requesting physician: Dr. Reather Converse  Reason for Consult: Acute onset of aphasia and left sided motor deficits   History obtained from:  EMS and Chart     HPI:                                                                                                                                          Carlos Mckinney is an 26 y.o. male with PMHx of right sided DVT and bilateral PE, cocaine abuse, obesity, tobacco use, systolic heart failure with reduced ejection fraction (cMRI 2/6 EF 12%, RVEF 18%), iron deficiency anemia 2/2 hemorrhoids presents to [facility] due to left sided weakness, aphasia, and lethargy. Patient's last known well was 2/20 10 PM. Per family, patient fell around 4 AM but returned to bed. Throughout the morning, patient had been observed mildly lethargic. Family called for EMS when they noticed patient was minimally arousable and not following commands around 11 AM.  Per EMS, patient does not follow commands, very lethargic and does not move LUE. ECG was normal, CBG wnl, normotensive, O2 sat 99%.   Unclear if patient is taking Eliquis per chart review as he reported having hematochezia on Eliquis but was recommended to continue taking on 2/15 at Heart Failure clinic.  Stroke team at bedside on arrival.  NIHSS: 24 LKW: 2/20 22:00 TNK given: No, because outside of window IR Thrombectomy: Pending, code IR initiated after CT perfusion showed hypoperfusion without infarct core in right anterior MCA and left posterior MCA approximately 17 mL total. CTA showed a suspected right superior M2 MCA occlusion or severe stenosis in the region of the right anterior MCA territory penumbra  Past Medical History:  Diagnosis Date   Heart failure ([place])    Hypertension    Polysubstance abuse ([place])     No past surgical history on file.  Family History  Problem Relation Age of Onset   Hypertension Mother             Social History:  reports that  he has quit smoking. His smoking use included cigarettes. He has never used smokeless tobacco. He reports that he does not drink alcohol and does not use drugs.  No Known Allergies  HOME MEDICATIONS:  No current facility-administered medications on file prior to encounter.   Current Outpatient Medications on File Prior to Encounter  Medication Sig Dispense Refill   albuterol (VENTOLIN HFA) 108 (90 Base) MCG/ACT inhaler Inhale 1-2 puffs into the lungs every 6 (six) hours as needed for wheezing or shortness of breath.     apixaban (ELIQUIS) 5 MG TABS tablet Take 1 tablet (5 mg total) by mouth 2 (two) times daily. 60 tablet 6   digoxin (LANOXIN) 0.125 MG tablet Take 1 tablet (0.125 mg total) by mouth daily. 30 tablet 6   empagliflozin (JARDIANCE) 10 MG TABS tablet Take 1 tablet (10 mg total) by mouth daily. 30 tablet 6   furosemide (LASIX) 40 MG tablet Take 1 tablet (40 mg total) by mouth daily. 30 tablet 3   hydrocortisone (ANUSOL-HC) 25 MG suppository Place 1 suppository (25 mg total) rectally 2 (two) times daily. 12 suppository 0   phenylephrine-shark liver oil-mineral oil-petrolatum (PREPARATION H) 0.25-14-74.9 % rectal ointment Place 1 application rectally 2 (two) times daily as needed for hemorrhoids. 25 g 1   polyethylene glycol (MIRALAX / GLYCOLAX) 17 g packet Take 17 g by mouth daily. 30 each 4   potassium chloride (KLOR-CON) 20 MEQ packet Use 1 packet (20 mEq) by mouth daily. 30 packet 6   sacubitril-valsartan (ENTRESTO) 49-51 MG Take 1 tablet by mouth 2 (two) times daily. 60 tablet 4   spironolactone (ALDACTONE) 25 MG tablet Take 1 tablet (25 mg total) by mouth daily. 30 tablet 6     ROS:                                                                                                                                       Unable to obtain due to anosognosia and confusion.     There were no vitals taken for this visit.   General Examination:                                                                                                       Physical Exam  HEENT-  [place]/AT   Lungs- Respirations unlabored Extremities- No edema  Neurological Examination Mental Status: Awake with decreased level of alertness. Poor attention. Mild agitation. Left hemineglect and anosognosia; does not appear to be aware of deficits. Speech dysarthric and sparse. States multiple times "I need to go pee" and "I need to use the bathroom" in response to questions and commands. Does not answer orientation questions. Does not follow commands but will  make eye contact and attend to some right sided stimuli. Can be redirected with tactile stimuli at times.  Cranial Nerves: II: Blinks to threat on the right. No blink to threat on the left.   III,IV, VI: No ptosis. Rightward gaze deviation, but will at times gaze to the left with some left sided loud vocal stimuli.  No nystagmus.  V: Decreased responses to right sided tactile stimuli VII: Left facial droop VIII: Hearing intact to loud voice IX,X: Unable to assess.  XI: Head preferentially rotated to the right XII: Does not protrude tongue to command Motor: RUE 5/5 RLE 5/5 LUE flaccid tone with no movement to command or noxious.  LLE withdraws to noxious plantar stimulation less briskly than on the right Sensory: Directs attention to contralateral limb with slight vocal exclamation when LUE and LLE are pinched.  Plantars: Right: downgoing   Left: downgoing Cerebellar: Not following commands for testing. No gross ataxia with RUE and BLE movements.  Gait: Unable to assess   Lab Results: Basic Metabolic Panel: Recent Labs  Lab 09/14/21 1211  NA 136  K 4.1  CL 103  CO2 24  GLUCOSE 93  BUN 7  CREATININE 1.26*  CALCIUM 9.2  MG 2.1    CBC: Recent Labs  Lab 09/14/21 1211  WBC 5.5  HGB 9.9*  HCT 33.4*  MCV 88.8  PLT 569*     Cardiac Enzymes: No results for input(s): CKTOTAL, CKMB, CKMBINDEX, TROPONINI in the last 168 hours.  Lipid Panel: No results for input(s): CHOL, TRIG, HDL, CHOLHDL, VLDL, LDLCALC in the last 168 hours.  Imaging:  TTE 09/02/21:  1. No apical mural thrombus . Left ventricular ejection fraction, by  estimation, is <20%. The left ventricle has severely decreased function.  The left ventricle demonstrates global hypokinesis. The left ventricular  internal cavity size was severely  dilated. Left ventricular diastolic parameters are indeterminate.   2. Right ventricular systolic function is moderately reduced. The right  ventricular size is normal. There is moderately elevated pulmonary artery  systolic pressure.   3. Left atrial size was moderately dilated.   4. The pericardial effusion is posterior to the left ventricle and  localized near the right atrium.   5. The mitral valve is normal in structure. Trivial mitral valve  regurgitation. No evidence of mitral stenosis.   6. Tricuspid valve regurgitation is severe.   7. The aortic valve is tricuspid. Aortic valve regurgitation is not  visualized. No aortic stenosis is present.   8. The inferior vena cava is dilated in size with >50% respiratory  variability, suggesting right atrial pressure of 8 mmHg.   Assessment: 26 year old male with a history of DVT on Eliquis, presenting with acute onset of left sided facial droop, left sided weakness, dysarthria, receptive and expressive aphasia, right gaze deviation and left hemineglect  1. Exam reveals findings referable to a right cerebral hemisphere lesion. Overall presentation most consistent with acute right MCA territory ischemic infarction.  2. CT head: No acute abnormality 3. CTA of head: Severely limited study due to motion and venous timing. M1 MCAs patent with suspected right superior M2 MCA occlusion or severe stenosis in the region of right anterior MCA territory penumbra. 4. CTA  neck: Severely motion limited study. The visualized common carotid arteries and internal carotid arteries appear to be grossly patent. Nondiagnostic evaluation of the vertebral arteries. Also, nondiagnostic evaluation for stenosis.Marland Kitchen  5. CT perfusion: Prelimnary review of images reveals a medium-sized region of hypoperfusion without  core infarct in the right frontal operculum 6. Not a TNK candidate due to anticoagulation.  7. Consent obtained from patient's mother over the telephone [facility], [phone]) for endovascular thrombectomy. Full risks/benefits discussed with Dr. Estanislado Pandy on speakerphone, including 10% risk of subarachnoid hemorrhage with possible associated severe morbidity or death, with a countervailing benefit of approximately 50% success rate resulting in significant partial recovery or total neurological recovery. All questions answered.   Recommendations:   1. Code IR has been called out.  2. Patient being taken emergently to the IR suite.  3. Will admit to the ICU under the Neurology service after IR.  Addendum: - VIR procedure completed. Findings of occluded RT MCA sup division prox. S/P cpmplete revascularization of the sup division with x 1 pass with solitaire 52mmx 40 mm retriever and contact aspiration achieving a TICI 2C revascularization. Post CT brain no ICH   - Patient left intubated as unresponsive prior to the procedure. - Admitting to 4N for post-IR care to include frequent neuro checks and BP management.  - Post-tPA order set to include frequent neuro checks and BP management.  - No antiplatelet medications or anticoagulants for at least 24 hours following tPA.  - DVT prophylaxis with SCDs.  - Will need to be started on a statin.  - Will need to be restarted on oral anticoagulation if follow up CT at 24 hours is negative for hemorrhagic conversion. - TEE with bubble study.  - MRI brain  - PT/OT/Speech.  - NPO until passes swallow evaluation.  - Telemetry  monitoring - Fasting lipid panel, HgbA1c  60 minutes spent in the emergent neurological evaluation and management of this critically ill patient  Addendum: VIR procedure completed: "S/P Rt common carotid arteriogram RT CFA approach. Findings. 1.Occluded RT MCA sup division prox. S/P cpmplete revascularization of the sup division with x 1 pass with solitaire 6mmx 40 mm retriever and contact aspiration achieving a TICI 2C revascularization. Post CT brain no ICH  distal pulses intact. Patient left intubated as unresponsive prior to the procedure. S.Deveshwar MD"  Electronically signed: Dr. Kerney Elbe 09/20/2021, 11:43 AM

## 2021-09-20 NOTE — Anesthesia Preprocedure Evaluation (Addendum)
Anesthesia Evaluation  Patient identified by MRN, date of birth, ID band Patient awake and Patient confused    Reviewed: Allergy & Precautions, NPO status , Patient's Chart, lab work & pertinent test resultsPreop documentation limited or incomplete due to emergent nature of procedure.  History of Anesthesia Complications Negative for: history of anesthetic complications  Airway Mallampati: Unable to assess   Neck ROM: Full  Mouth opening: Limited Mouth Opening Comment: Pt uncooperative Dental  (+) Dental Advisory Given   Pulmonary former smoker,    breath sounds clear to auscultation       Cardiovascular hypertension, +CHF (Entresto) and + DVT   Rhythm:Regular Rate:Normal  09/05/2021 Cardiac CT: Severely dilated LV with diffuse hypokinesis, EF 12%. Moderately dilated RV with EF 18%  09/02/2021 ECHO: EF <20%. The LV has severely decreased function, global hypokinesis. The left ventricular internal cavity size was severely  dilated. Right ventricular systolic function is moderately reduced. The right ventricular size is normal. There is moderately elevated pulmonary artery systolic pressure.   Left atrial size was moderately dilated.  The pericardial effusion is posterior to the left ventricle and localized near the right atrium.  MV is normal in structure. Trivial MR. No MS.  TR is severe.    Neuro/Psych L hemiparesis, deviated gaze    GI/Hepatic negative GI ROS, (+)     substance abuse  cocaine use and marijuana use,   Endo/Other  negative endocrine ROS  Renal/GU negative Renal ROS     Musculoskeletal   Abdominal   Peds  Hematology  (+) Blood dyscrasia (Hb 10.5), anemia , eliquis   Anesthesia Other Findings   Reproductive/Obstetrics                            Anesthesia Physical Anesthesia Plan  ASA: 4 and emergent  Anesthesia Plan: General   Post-op Pain Management: Minimal or no  pain anticipated   Induction: Intravenous and Rapid sequence  PONV Risk Score and Plan: 2 and Ondansetron, Dexamethasone and Treatment may vary due to age or medical condition  Airway Management Planned: Oral ETT  Additional Equipment: Arterial line  Intra-op Plan:   Post-operative Plan: Possible Post-op intubation/ventilation  Informed Consent: I have reviewed the patients History and Physical, chart, labs and discussed the procedure including the risks, benefits and alternatives for the proposed anesthesia with the patient or authorized representative who has indicated his/her understanding and acceptance.       Plan Discussed with: CRNA and Surgeon  Anesthesia Plan Comments: (Emergent as per Dr. Estanislado Pandy, he obtained consent from pt's mother)        Anesthesia Quick Evaluation

## 2021-09-20 NOTE — H&P (Signed)
NAME:  Carlos Mckinney, MRN:  Branch:[id number], DOB:  12/04/1995, LOS: 0 ADMISSION DATE:  09/20/2021, CONSULTATION DATE:  2/21 REFERRING MD:  Estanislado Pandy, CHIEF COMPLAINT:  AMS   History of Present Illness:  Carlos Mckinney is a 26 y.o. male with past medical hx of CHF and cardiogenic shock, DVT on eliquis, tobacco use, cocaine abuse, and obesity presenting to [facility] 2/21   Patient from home where he was LKW at 2200 on 09/19/2021 and now complaining of not answering questions, left sided weakness, acting strange per family. Patient reports that he was at his baseline last night when he went to bed at 2200. At 0400, they heard him fall and he got back in bed. She saw him again at [facility] and patient was slurring words with had difficulty walking at that time.   Stroke team at the bedside on patient arrival. Labs drawn and patient cleared for CT by Dr. Maryan Rued. Patient to CT with team. NIHSS 24, see documentation for details and code stroke times. Patient with decreased LOC, disoriented, not following commands, right gaze preference , left hemianopia, left facial droop, left arm weakness, left leg weakness, left decreased sensation, Global aphasia , and Sensory  neglect on exam. The following imaging was completed:  CT, CTA head and neck, CTP. Patient is not a candidate for IV Thrombolytic due to being on Eliquis.   Patient taken to IR.  Pertinent  Medical History  Prior DVT on Eliquis HFrEF (EF<20%)  Significant Hospital Events: Including procedures, antibiotic start and stop dates in addition to other pertinent events   2/21 intubated for airway protection, rt cc arteriogram   Interim History / Subjective:  Patient assessed at bedside. Intubated and sedated.  Objective   Blood pressure 123/82, pulse (!) 119, weight 92 kg, SpO2 99 %.        Intake/Output Summary (Last 24 hours) at 09/20/2021 1444 Last data filed at 09/20/2021 1245 Gross per 24 hour  Intake --  Output 50 ml  Net -50 ml   Filed  Weights   09/20/21 1100  Weight: 92 kg    Examination: General:  In bed on vent, sedated HENT: NCAT ETT in place PULM: diffuse rhonchi present bilaterally, vent supported breathing CV: RRR, no mgr GI: BS+, soft, nontender MSK: normal bulk and tone Neuro: sedated on vent, pupils equal and reactive to light, babinski downgoing, -corneal reflex, no cough reflex   Glucose- 110 INR 1.3 Hgb 12.9-> 10.5 Albumin 3.1  CT head- hypoattenuation of right insula, possible acute r MCA territory infarct  Rt common carotid arteriogram- occluded right MCA sup division prox  Resolved Hospital Problem list     Assessment & Plan:  Acute right MCA territory ischemic stroke s/p right common carotid arteriogram Patient presented with focal deficits, aphasia, and AMS. CTA head showed hypoattentuation of right insula. IR consulted and completed Right common carotid arteriogram s/p complete revascularization. -Neurology following , appreciate their recommendations -F/u MRI head -SBP 120-130s -Levophed PRN to maintain systolic pressures -PT eval and treat -OT eval and treat  Acute encephalopathy High risk for aspiration Patient intubated for airway protection. Will extubate as soon as possible. -SAT and SBT as able  Heart failure with reduced EF Non-ischemic Last echo 09/02/21 with EF <20%, and moderately reduced right ventricular systolic function. Follows with heart failure team. -Heart failure team consulted -Holding home medications include digoxin 0.125 mg, jardiance 10 mg, entresto 49-51 mg, Lasix 40 mg, spironolactone 25 mg  History of acute DVT  01/23 Patient on Eliquis. Holding in setting of acute CVA.  Normocytic anemia Elevated INR Hgb 10.5, MCV 90.2. baseline Hgb around 11. INR at 1.3. Tbili elevated 1.7. -trend CBC -transfuse if <7.0 -repeat CMP in AM -fractionated bili in AM  Hypoalbuminemia Albumin 3.1   Best Practice (right click and "Reselect all SmartList  Selections" daily)   Diet/type: NPO DVT prophylaxis: not indicated GI prophylaxis: PPI Lines: Arterial Line Foley:  Yes, and it is still needed Code Status:  full code Last date of multidisciplinary goals of care discussion [updated mother over phone 2/21]  Curties Conigliaro M. Jordane Hisle, D.O.  Internal Medicine Resident, PGY-1 [facility]  Pager: [phone] 2:44 PM, 09/20/2021

## 2021-09-20 NOTE — Anesthesia Postprocedure Evaluation (Signed)
Anesthesia Post Note  Patient: Carlos Mckinney  Procedure(s) Performed: IR WITH ANESTHESIA     Patient location during evaluation: ICU Anesthesia Type: General Level of consciousness: sedated and patient remains intubated per anesthesia plan Pain management: pain level controlled Vital Signs Assessment: post-procedure vital signs reviewed and stable Respiratory status: patient remains intubated per anesthesia plan and patient on ventilator - see flowsheet for VS Cardiovascular status: stable (remains on epi gtt) Postop Assessment: no apparent nausea or vomiting Anesthetic complications: no   No notable events documented.  Last Vitals:  Vitals:   09/20/21 1242 09/20/21 1448  BP:    Pulse:  94  Resp:  15  SpO2: 99% 100%    Last Pain: There were no vitals filed for this visit.               Germaine Pomfret

## 2021-09-20 NOTE — Progress Notes (Signed)
An USGPIV (ultrasound guided PIV) has been placed for short-term vasopressor infusion. A correctly placed ivWatch must be used when administering Vasopressors. Should this treatment be needed beyond 72 hours, central line access should be obtained.  It will be the responsibility of the bedside nurse to follow best practice to prevent extravasations.   ?

## 2021-09-20 NOTE — Progress Notes (Signed)
RT obtained ABG and reported results to CCM PA. ABG as follows: 7.31/47.7/279/24.7. RT increased RR form 15 to 20 and decreased FiO2 from 100% to 40% per PA. RT will continue to monitor.

## 2021-09-20 NOTE — Code Documentation (Addendum)
Stroke Response Nurse Documentation Code Documentation  Carlos Mckinney is a 26 y.o. male arriving to [facility] ED via [facility] on 09/20/2021 with past medical hx of DVT. On Eliquis (apixaban) daily. Code stroke was activated by EMS.   Patient from home where he was LKW at 2200 on 09/19/2021 and now complaining of not answering questions, left sided weakness, acting strange per family. Patient reports that he was at his baseline last night when he went to bed at 2200. At 0400, they heard him fall and he got back in bed. This morning, acting slightly abnormal and then had stark change around 1100.   Stroke team at the bedside on patient arrival. Labs drawn and patient cleared for CT by Dr. Maryan Rued. Patient to CT with team. NIHSS 24, see documentation for details and code stroke times. Patient with decreased LOC, disoriented, not following commands, right gaze preference , left hemianopia, left facial droop, left arm weakness, left leg weakness, left decreased sensation, Global aphasia , and Sensory  neglect on exam. The following imaging was completed:  CT, CTA head and neck, CTP. Patient is not a candidate for IV Thrombolytic due to being on Eliquis.  Delays due to patient being agitated and needed Ativan prior to CTP to hold still. CTP delayed in canopy system. No known reason.   Patient taken to IR.  Bedside handoff with ED RN Marya Amsler, RN.    Kathrin Greathouse  Stroke Response RN

## 2021-09-20 NOTE — Transfer of Care (Signed)
Immediate Anesthesia Transfer of Care Note  Patient: Carlos Mckinney  Procedure(s) Performed: IR WITH ANESTHESIA  Patient Location: ICU  Anesthesia Type:General  Level of Consciousness: Patient remains intubated per anesthesia plan  Airway & Oxygen Therapy: Patient remains intubated per anesthesia plan and Patient placed on Ventilator (see vital sign flow sheet for setting)  Post-op Assessment: Report given to RN and Post -op Vital signs reviewed and stable  Post vital signs: Reviewed and stable  Last Vitals:  Vitals Value Taken Time  BP    Temp    Pulse 93 09/20/21 1452  Resp 0 09/20/21 1452  SpO2 100 % 09/20/21 1452  Vitals shown include unvalidated device data.  Last Pain: There were no vitals filed for this visit.       Complications: No notable events documented.

## 2021-09-21 ENCOUNTER — Inpatient Hospital Stay (HOSPITAL_COMMUNITY): Payer: Medicaid Other

## 2021-09-21 ENCOUNTER — Encounter (HOSPITAL_COMMUNITY): Payer: Self-pay | Admitting: Radiology

## 2021-09-21 DIAGNOSIS — I255 Ischemic cardiomyopathy: Secondary | ICD-10-CM

## 2021-09-21 DIAGNOSIS — Z978 Presence of other specified devices: Secondary | ICD-10-CM

## 2021-09-21 DIAGNOSIS — I63413 Cerebral infarction due to embolism of bilateral middle cerebral arteries: Principal | ICD-10-CM

## 2021-09-21 DIAGNOSIS — I6601 Occlusion and stenosis of right middle cerebral artery: Secondary | ICD-10-CM

## 2021-09-21 DIAGNOSIS — Z86718 Personal history of other venous thrombosis and embolism: Secondary | ICD-10-CM

## 2021-09-21 DIAGNOSIS — F17200 Nicotine dependence, unspecified, uncomplicated: Secondary | ICD-10-CM

## 2021-09-21 DIAGNOSIS — F1410 Cocaine abuse, uncomplicated: Secondary | ICD-10-CM

## 2021-09-21 LAB — LIPID PANEL
Cholesterol: 87 mg/dL (ref 0–200)
HDL: 26 mg/dL — ABNORMAL LOW (ref >40)
LDL Cholesterol: 45 mg/dL (ref 0–99)
Total CHOL/HDL Ratio: 3.3 RATIO
Triglycerides: 80 mg/dL (ref <150)
VLDL: 16 mg/dL (ref 0–40)

## 2021-09-21 LAB — BILIRUBIN, FRACTIONATED(TOT/DIR/INDIR)
Bilirubin, Direct: 0.5 mg/dL — ABNORMAL HIGH (ref 0.0–0.2)
Indirect Bilirubin: 0.7 mg/dL (ref 0.3–0.9)
Total Bilirubin: 1.2 mg/dL (ref 0.3–1.2)

## 2021-09-21 LAB — HEMOGLOBIN A1C
Hgb A1c MFr Bld: 4.7 % — ABNORMAL LOW (ref 4.8–5.6)
Mean Plasma Glucose: 88 mg/dL

## 2021-09-21 LAB — GLUCOSE, CAPILLARY
Glucose-Capillary: 102 mg/dL — ABNORMAL HIGH (ref 70–99)
Glucose-Capillary: 102 mg/dL — ABNORMAL HIGH (ref 70–99)
Glucose-Capillary: 115 mg/dL — ABNORMAL HIGH (ref 70–99)
Glucose-Capillary: 117 mg/dL — ABNORMAL HIGH (ref 70–99)
Glucose-Capillary: 127 mg/dL — ABNORMAL HIGH (ref 70–99)
Glucose-Capillary: 99 mg/dL (ref 70–99)

## 2021-09-21 LAB — COMPREHENSIVE METABOLIC PANEL
ALT: 12 U/L (ref 0–44)
AST: 18 U/L (ref 15–41)
Albumin: 2.6 g/dL — ABNORMAL LOW (ref 3.5–5.0)
Alkaline Phosphatase: 61 U/L (ref 38–126)
Anion gap: 8 (ref 5–15)
BUN: 5 mg/dL — ABNORMAL LOW (ref 6–20)
CO2: 22 mmol/L (ref 22–32)
Calcium: 8.7 mg/dL — ABNORMAL LOW (ref 8.9–10.3)
Chloride: 107 mmol/L (ref 98–111)
Creatinine, Ser: 0.99 mg/dL (ref 0.61–1.24)
GFR, Estimated: >60 mL/min (ref >60)
Glucose, Bld: 102 mg/dL — ABNORMAL HIGH (ref 70–99)
Potassium: 4 mmol/L (ref 3.5–5.1)
Sodium: 137 mmol/L (ref 135–145)
Total Bilirubin: 1.3 mg/dL — ABNORMAL HIGH (ref 0.3–1.2)
Total Protein: 6.1 g/dL — ABNORMAL LOW (ref 6.5–8.1)

## 2021-09-21 LAB — CBC WITH DIFFERENTIAL/PLATELET
Abs Immature Granulocytes: 0.02 10*3/uL (ref 0.00–0.07)
Basophils Absolute: 0 10*3/uL (ref 0.0–0.1)
Basophils Relative: 0 %
Eosinophils Absolute: 0.1 10*3/uL (ref 0.0–0.5)
Eosinophils Relative: 2 %
HCT: 32.2 % — ABNORMAL LOW (ref 39.0–52.0)
Hemoglobin: 10.1 g/dL — ABNORMAL LOW (ref 13.0–17.0)
Immature Granulocytes: 0 %
Lymphocytes Relative: 17 %
Lymphs Abs: 1.2 10*3/uL (ref 0.7–4.0)
MCH: 28.1 pg (ref 26.0–34.0)
MCHC: 31.4 g/dL (ref 30.0–36.0)
MCV: 89.7 fL (ref 80.0–100.0)
Monocytes Absolute: 0.6 10*3/uL (ref 0.1–1.0)
Monocytes Relative: 9 %
Neutro Abs: 5.3 10*3/uL (ref 1.7–7.7)
Neutrophils Relative %: 72 %
Platelets: 321 10*3/uL (ref 150–400)
RBC: 3.59 MIL/uL — ABNORMAL LOW (ref 4.22–5.81)
RDW: 24.7 % — ABNORMAL HIGH (ref 11.5–15.5)
WBC: 7.3 10*3/uL (ref 4.0–10.5)
nRBC: 0 % (ref 0.0–0.2)

## 2021-09-21 LAB — PROTIME-INR
INR: 1.4 — ABNORMAL HIGH (ref 0.8–1.2)
Prothrombin Time: 16.9 seconds — ABNORMAL HIGH (ref 11.4–15.2)

## 2021-09-21 MED ORDER — DEXMEDETOMIDINE HCL IN NACL 400 MCG/100ML IV SOLN
0.4000 ug/kg/h | INTRAVENOUS | Status: DC
  Administered 2021-09-21: 1 ug/kg/h via INTRAVENOUS
  Administered 2021-09-21: 0.8 ug/kg/h via INTRAVENOUS
  Administered 2021-09-22: 0.4 ug/kg/h via INTRAVENOUS
  Filled 2021-09-21 (×3): qty 100

## 2021-09-21 MED ORDER — ASPIRIN 325 MG PO TABS
325.0000 mg | ORAL_TABLET | Freq: Every day | ORAL | Status: DC
  Administered 2021-09-21 – 2021-09-24 (×3): 325 mg via ORAL
  Filled 2021-09-21 (×3): qty 1

## 2021-09-21 MED ORDER — PANTOPRAZOLE 2 MG/ML SUSPENSION
40.0000 mg | Freq: Every day | ORAL | Status: DC
  Administered 2021-09-21: 40 mg
  Filled 2021-09-21: qty 20

## 2021-09-21 MED ORDER — EMPAGLIFLOZIN 10 MG PO TABS
10.0000 mg | ORAL_TABLET | Freq: Every day | ORAL | Status: DC
  Administered 2021-09-21 – 2021-09-24 (×3): 10 mg via ORAL
  Filled 2021-09-21 (×5): qty 1

## 2021-09-21 NOTE — TOC CAGE-AID Note (Signed)
Transition of Care [facility]) - CAGE-AID Screening   Patient Details  Name: Carlos Mckinney MRN: [id number] Date of Birth: [birth year]  Transition of [facility]) CM/SW Contact:    Milliana Reddoch C Tarpley-Carter, LCSWA Phone Number: 09/21/2021, 8:48 AM   Clinical Narrative: Pt is unable to participate in Cage Aid. CSW will assess at a better time.  Lemoyne Scarpati Tarpley-Carter, MSW, LCSW-A Pronouns:  She/Her/Hers [place] Transitions of Care Clinical Social Worker Direct Number:  [phone] [email or web address]  CAGE-AID Screening: Substance Abuse Screening unable to be completed due to: : Patient unable to participate             Substance Abuse Education Offered: Yes  Substance abuse interventions: Transport planner

## 2021-09-21 NOTE — Progress Notes (Signed)
Referring Physician(s): Kerney Elbe  Supervising Physician: Luanne Bras  Patient Status:  [facility] - In-pt  Chief Complaint:  Code stroke  Brief History:  Carlos Mckinney is a 26 yo male with past medical history of right sided DVT and bilateral PE, cocaine abuse, obesity, tobacco use, systolic heart failure with reduced ejection fraction.  He presented to the ED yesterday due to left sided weakness, aphasia, and lethargy.   The family called EMS when they noticed patient was minimally arousable and not following commands around 11 AM.   CT showed hypoperfusion without infarct core in right anterior MCA and left posterior MCA approximately 17 mL total.   CTA showed a suspected right superior M2 MCA occlusion or severe stenosis in the region of the right anterior MCA territory penumbra.  He underwent mechanical thrombectomy by Dr. Estanislado Pandy. His procedure note reads=  S/P Rt common carotid arteriogram RT CFA approach. Findings. 1.Occluded RT MCA sup division prox. S/P cpmplete revascularization of the sup division with x 1 pass with solitaire 3mmx 40 mm retriever and contact aspiration achieving a TICI 2C revascularization. Post CT brain no ICH  distal pulses intact. Patient left intubated as unresponsive prior to the procedure.   Subjective:  Remains intubated/vent on propofol.  Allergies: Patient has no known allergies.  Medications: Prior to Admission medications   Medication Sig Start Date End Date Taking? Authorizing Provider  albuterol (VENTOLIN HFA) 108 (90 Base) MCG/ACT inhaler Inhale 1-2 puffs into the lungs every 6 (six) hours as needed for wheezing or shortness of breath.    [provider]  apixaban (ELIQUIS) 5 MG TABS tablet Take 1 tablet (5 mg total) by mouth 2 (two) times daily. 09/06/21   Clegg, Amy D, NP  digoxin (LANOXIN) 0.125 MG tablet Take 1 tablet (0.125 mg total) by mouth daily. 09/07/21   Clegg, Amy D, NP  empagliflozin (JARDIANCE) 10 MG  TABS tablet Take 1 tablet (10 mg total) by mouth daily. 09/07/21   Clegg, Amy D, NP  furosemide (LASIX) 40 MG tablet Take 1 tablet (40 mg total) by mouth daily. 09/06/21   Clegg, Amy D, NP  hydrocortisone (ANUSOL-HC) 25 MG suppository Place 1 suppository (25 mg total) rectally 2 (two) times daily. 09/14/21   Milford, Maricela Bo, FNP  phenylephrine-shark liver oil-mineral oil-petrolatum (PREPARATION H) 0.25-14-74.9 % rectal ointment Place 1 application rectally 2 (two) times daily as needed for hemorrhoids. 09/14/21   Milford, Maricela Bo, FNP  polyethylene glycol (MIRALAX / GLYCOLAX) 17 g packet Take 17 g by mouth daily. 09/14/21   Milford, Maricela Bo, FNP  potassium chloride (KLOR-CON) 20 MEQ packet Use 1 packet (20 mEq) by mouth daily. 09/06/21   Clegg, Amy D, NP  sacubitril-valsartan (ENTRESTO) 49-51 MG Take 1 tablet by mouth 2 (two) times daily. 09/14/21   Rafael Bihari, FNP  spironolactone (ALDACTONE) 25 MG tablet Take 1 tablet (25 mg total) by mouth daily. 09/07/21   Clegg, Amy D, NP     Vital Signs: BP 122/75    Pulse (!) 108    Temp 98.1 F (36.7 C) (Axillary)    Resp (!) 26    Ht 5\' 11"  (1.803 m)    Wt 207 lb 0.2 oz (93.9 kg)    SpO2 97%    BMI 28.87 kg/m   Physical Exam Constitutional:      Comments: Intubated/vent/Sedated  HENT:     Head: Normocephalic and atraumatic.  Cardiovascular:     Rate and Rhythm: Tachycardia present.  Musculoskeletal:     Comments: Right femoral artery access site soft, no bleeding or hematoma.  Skin:    General: Skin is warm and dry.    Imaging: MR BRAIN WO CONTRAST  Result Date: 09/21/2021 CLINICAL DATA:  Right MCA occlusion, status post thrombectomy. EXAM: MRI HEAD WITHOUT CONTRAST TECHNIQUE: Multiplanar, multiecho pulse sequences of the brain and surrounding structures were obtained without intravenous contrast. COMPARISON:  CTA 09/20/2021, no prior MRI FINDINGS: Brain: Restricted diffusion with ADC correlate in the right MCA territory, primarily in the  right insula (series 5, image 78), right frontal lobe (series 5, images 83-88), and anterior to mid right temporal lobe (series 5, image 78). Restricted diffusion with ADC correlate is also seen in the left temporal lobe (series 5, image 76-79). These areas are associated with increased T2 signal, right-greater-than-left, and gyral swelling. No acute hemorrhage, mass, mass effect, or midline shift. No hydrocephalus or extra-axial collection. Vascular: Normal flow voids. Skull and upper cervical spine: Normal marrow signal. Sinuses/Orbits: Mucosal thickening in the ethmoid air cells and maxillary sinuses. The orbits are unremarkable. Other: The mastoids are well aerated. IMPRESSION: 1. Status post thrombectomy with large area of restricted diffusion in the right MCA territory, in both the right frontal and temporal lobes, consistent with acute infarcts. 2. Additional moderate to large acute infarct in the left temporal lobe in the left MCA territory. . Electronically Signed   By: Merilyn Baba M.D.   On: 09/21/2021 03:17   DG Chest Port 1 View  Result Date: 09/20/2021 CLINICAL DATA:  Intubated EXAM: PORTABLE CHEST 1 VIEW COMPARISON:  09/06/2021 FINDINGS: Endotracheal tube is approximately 3.8 cm above the carina. Enteric tube passes into the stomach with tip out of field of view. Stable enlargement of the cardiomediastinal silhouette. Persistent right pleural effusion and adjacent atelectasis. IMPRESSION: Lines and tubes as above. Stable cardiomegaly. Persistent right pleural effusion and adjacent atelectasis. Electronically Signed   By: Macy Mis M.D.   On: 09/20/2021 16:58   DG Abd Portable 1V  Result Date: 09/20/2021 CLINICAL DATA:  26 year old male status post orogastric tube placement. EXAM: PORTABLE ABDOMEN - 1 VIEW COMPARISON:  None. FINDINGS: Nonobstructive bowel gas pattern. The stomach is decompressed. Gastric decompression tube distal tip is positioned in the second portion the duodenum.  IMPRESSION: Gastric decompression tube distal tip is position within the second portion of the duodenum. Recommend retraction by approximately 15 cm reposition within the gastric body. Electronically Signed   By: Ruthann Cancer M.D.   On: 09/20/2021 16:57   CT HEAD CODE STROKE WO CONTRAST  Addendum Date: 09/20/2021   ADDENDUM REPORT: 09/20/2021 13:28 ADDENDUM: On further review and correlation with findings on CTA, there is suspected hypoattenuation of the right insula which may represent sequela of acute right MCA territory infarct. Streak artifact in this region does limit evaluation and recommend correlation with the forthcoming/ordered MRI. Addendum discussed with Dr. Cheral Marker via telephone at 1:20 PM. Electronically Signed   By: Margaretha Sheffield M.D.   On: 09/20/2021 13:28   Result Date: 09/20/2021 CLINICAL DATA:  Code stroke.  Neuro deficit, acute, stroke suspected EXAM: CT HEAD WITHOUT CONTRAST TECHNIQUE: Contiguous axial images were obtained from the base of the skull through the vertex without intravenous contrast. RADIATION DOSE REDUCTION: This exam was performed according to the departmental dose-optimization program which includes automated exposure control, adjustment of the mA and/or kV according to patient size and/or use of iterative reconstruction technique. COMPARISON:  None. FINDINGS: Mildly motion limited study.  Within  this limitation: Brain: No evidence of acute infarction, hemorrhage, hydrocephalus, extra-axial collection or mass lesion/mass effect. Vascular: No hyperdense vessel identified. Skull: No acute fracture. Sinuses/Orbits: Bilateral inferior maxillary sinus mucosal thickening. Unremarkable orbits. Other: No mastoid effusions. ASPECTS [facility] Stroke Program Early CT Score) total score (0-10 with 10 being normal): 10. IMPRESSION: No evidence of acute intracranial abnormality on this mildly motion limited study. Code stroke imaging results were communicated on 09/20/2021 at 11:57  am to provider Dr. Cheral Marker via secure text paging. Electronically Signed: By: Margaretha Sheffield M.D. On: 09/20/2021 11:58   CT ANGIO HEAD NECK W WO CM W PERF (CODE STROKE)  Result Date: 09/20/2021 CLINICAL DATA:  Neuro deficit, acute, stroke suspected EXAM: CT ANGIOGRAPHY HEAD AND NECK CT PERFUSION BRAIN TECHNIQUE: Multidetector CT imaging of the head and neck was performed using the standard protocol during bolus administration of intravenous contrast. Multiplanar CT image reconstructions and MIPs were obtained to evaluate the vascular anatomy. Carotid stenosis measurements (when applicable) are obtained utilizing NASCET criteria, using the distal internal carotid diameter as the denominator. Multiphase CT imaging of the brain was performed following IV bolus contrast injection. Subsequent parametric perfusion maps were calculated using RAPID software. RADIATION DOSE REDUCTION: This exam was performed according to the departmental dose-optimization program which includes automated exposure control, adjustment of the mA and/or kV according to patient size and/or use of iterative reconstruction technique. CONTRAST:  174mL OMNIPAQUE IOHEXOL 350 MG/ML SOLN COMPARISON:  None. FINDINGS: CTA NECK FINDINGS Severely motion limited study. The visualized common carotid arteries and internal carotid arteries appear to be grossly patent. Nondiagnostic evaluation of the vertebral arteries. Also, nondiagnostic evaluation for stenosis. Review of the MIP images confirms the above findings CTA HEAD FINDINGS Severely limited study due to motion and venous timing. Within this limitation: Anterior circulation: The visible intracranial ICAs appear grossly patent. Bilateral M1 MCAs, A1 ACAs, and proximal A2 ACAs appear to be grossly patent. Evaluation of the more distal vessels is severely limited, including M2 MCAs and distal ACAs with suspected right superior M2 MCA occlusion or severe stenosis. Posterior circulation: Essentially  nondiagnostic evaluation of the vertebral arteries due to motion and streak. The visible basilar artery and proximal posterior cerebral arteries appear to be grossly patent. Nondiagnostic evaluation of the distal PCAs due to motion. Venous sinuses: As permitted by contrast timing, patent. Review of the MIP images confirms the above findings CT Brain Perfusion Findings: ASPECTS: 10 CBF (<30%) Volume: 77mL Perfusion (Tmax>6.0s) volume: 44mL Mismatch Volume: 59mL IMPRESSION: CT perfusion: Motion limited with RAPID reporting areas of penumbra in the right anterior MCA territory and the left posterior MCA territory. Overall penumbra is estimated to be 17 mL. No core infarct identified. CTA head: Severely limited study due to motion and venous timing. M1 MCAs patent with suspected right superior M2 MCA occlusion or severe stenosis in the region of right anterior MCA territory penumbra described above. CTA neck: Severely motion limited study. The visualized common carotid arteries and internal carotid arteries appear to be grossly patent. Nondiagnostic evaluation of the vertebral arteries. Also, nondiagnostic evaluation for stenosis. Preliminary findings discussed with Dr. Cheral Marker via telephone at 12:12 p.m. Perfusion findings discussed at 12:45 p.m. Electronically Signed   By: Margaretha Sheffield M.D.   On: 09/20/2021 12:52    Labs:  CBC: Recent Labs    09/06/21 0500 09/14/21 1211 09/20/21 1149 09/20/21 1150 09/20/21 1540 09/21/21 0556  WBC 8.5 5.5  --  8.4  --  7.3  HGB 9.1* 9.9* 12.9* 10.5*  11.9* 10.1*  HCT 29.6* 33.4* 38.0* 34.9* 35.0* 32.2*  PLT 431* 569*  --  345  --  321    COAGS: Recent Labs    09/01/21 2315 09/20/21 1150 09/21/21 0556  INR  --  1.3* 1.4*  APTT 56* 30  --     BMP: Recent Labs    09/07/21 0413 09/14/21 1211 09/20/21 1149 09/20/21 1150 09/20/21 1540 09/21/21 0556  NA 131* 136 136 133* 137 137  K 4.6 4.1 3.9 4.0 3.4* 4.0  CL 100 103 101 101  --  107  CO2 24 24  --   23  --  22  GLUCOSE 107* 93 102* 103*  --  102*  BUN <5* 7 5* 5*  --  5*  CALCIUM 9.0 9.2  --  8.9  --  8.7*  CREATININE 0.97 1.26* 1.10 1.16  --  0.99  GFRNONAA >60 >60  --  >60  --  >60    LIVER FUNCTION TESTS: Recent Labs    09/01/21 1200 09/20/21 1150 09/21/21 0556  BILITOT 2.6* 1.7* 1.3*  AST 27 26 18   ALT 26 14 12   ALKPHOS 69 64 61  PROT 6.2* 6.7 6.1*  ALBUMIN 2.8* 3.1* 2.6*    Assessment and Plan:  Code stroke.  Found to have occluded RT MCA sup division prox.   S/P cpmplete revascularization of the sup division with x 1 pass with solitaire 46mmx 40 mm retriever and contact aspiration achieving a TICI 2C revascularization yesterday by Dr. Estanislado Pandy.  Continue care per Neurology.  Can remove CFA access dressing tomorrow.  Electronically Signed: Murrell Redden, PA-C 09/21/2021, 8:15 AM    I spent a total of 15 Minutes at the the patient's bedside AND on the patient's hospital floor or unit, greater than 50% of which was counseling/coordinating care for f/u cerebral intervention.

## 2021-09-21 NOTE — Progress Notes (Signed)
°  Transition of Care [facility]) Screening Note   Patient Details  Name: SY SAINTJEAN Date of Birth: Jun 22, 1996   [facility] CM/SW Contact:    Mearl Latin, LCSW Phone Number: 09/21/2021, 9:11 AM    Transition of Care [facility]) has reviewed patient and no TOC needs have been identified at this time. We will continue to monitor patient advancement through interdisciplinary progression rounds. If new patient transition needs arise, please place a TOC consult.

## 2021-09-21 NOTE — Progress Notes (Addendum)
STROKE TEAM PROGRESS NOTE   INTERVAL HISTORY His wife is at the bedside. Unsure if he was compliant with eliquis due to history of blood in his stool, reported by wife.  This was noted in notes from last hospitalization, however we are unsure if he was taking his Eliquis appropriately.  She states that she watches him take his other medications every day. Per CCM, plan to change sedation in hopes of extubation later today or tomorrow.  MRI overnight shows large area of restricted diffusion in the right MCA territory, moderate to large acute infarct in the left temporal lobe.  Vitals:   09/21/21 0500 09/21/21 0600 09/21/21 0700 09/21/21 0747  BP: 118/76 121/77 121/85   Pulse: 76 81 90 88  Resp: 20 20 20 20   Temp:      TempSrc:      SpO2: 100% 100% 100% 100%  Weight:      Height:       CBC:  Recent Labs  Lab 09/20/21 1150 09/20/21 1540 09/21/21 0556  WBC 8.4  --  7.3  NEUTROABS 6.7  --  5.3  HGB 10.5* 11.9* 10.1*  HCT 34.9* 35.0* 32.2*  MCV 90.2  --  89.7  PLT 345  --  [id number]   Basic Metabolic Panel:  Recent Labs  Lab 09/14/21 1211 09/20/21 1149 09/20/21 1150 09/20/21 1540 09/21/21 0556  NA 136   < > 133* 137 137  K 4.1   < > 4.0 3.4* 4.0  CL 103   < > 101  --  107  CO2 24  --  23  --  22  GLUCOSE 93   < > 103*  --  102*  BUN 7   < > 5*  --  5*  CREATININE 1.26*   < > 1.16  --  0.99  CALCIUM 9.2  --  8.9  --  8.7*  MG 2.1  --   --   --   --    < > = values in this interval not displayed.   Lipid Panel:  Recent Labs  Lab 09/21/21 0556  CHOL 87  TRIG 80  HDL 26*  CHOLHDL 3.3  VLDL 16  LDLCALC 45   HgbA1c: No results for input(s): HGBA1C in the last 168 hours. Urine Drug Screen:  Recent Labs  Lab 09/20/21 1539  LABOPIA NONE DETECTED  COCAINSCRNUR POSITIVE*  LABBENZ NONE DETECTED  AMPHETMU NONE DETECTED  THCU POSITIVE*  LABBARB NONE DETECTED    Alcohol Level  Recent Labs  Lab 09/20/21 1534  ETH <10    IMAGING past 24 hours MR BRAIN WO  CONTRAST  Result Date: 09/21/2021 CLINICAL DATA:  Right MCA occlusion, status post thrombectomy. EXAM: MRI HEAD WITHOUT CONTRAST TECHNIQUE: Multiplanar, multiecho pulse sequences of the brain and surrounding structures were obtained without intravenous contrast. COMPARISON:  CTA 09/20/2021, no prior MRI FINDINGS: Brain: Restricted diffusion with ADC correlate in the right MCA territory, primarily in the right insula (series 5, image 78), right frontal lobe (series 5, images 83-88), and anterior to mid right temporal lobe (series 5, image 78). Restricted diffusion with ADC correlate is also seen in the left temporal lobe (series 5, image 76-79). These areas are associated with increased T2 signal, right-greater-than-left, and gyral swelling. No acute hemorrhage, mass, mass effect, or midline shift. No hydrocephalus or extra-axial collection. Vascular: Normal flow voids. Skull and upper cervical spine: Normal marrow signal. Sinuses/Orbits: Mucosal thickening in the ethmoid air cells and maxillary sinuses. The orbits  are unremarkable. Other: The mastoids are well aerated. IMPRESSION: 1. Status post thrombectomy with large area of restricted diffusion in the right MCA territory, in both the right frontal and temporal lobes, consistent with acute infarcts. 2. Additional moderate to large acute infarct in the left temporal lobe in the left MCA territory. . Electronically Signed   By: Merilyn Baba M.D.   On: 09/21/2021 03:17   DG Chest Port 1 View  Result Date: 09/20/2021 CLINICAL DATA:  Intubated EXAM: PORTABLE CHEST 1 VIEW COMPARISON:  09/06/2021 FINDINGS: Endotracheal tube is approximately 3.8 cm above the carina. Enteric tube passes into the stomach with tip out of field of view. Stable enlargement of the cardiomediastinal silhouette. Persistent right pleural effusion and adjacent atelectasis. IMPRESSION: Lines and tubes as above. Stable cardiomegaly. Persistent right pleural effusion and adjacent atelectasis.  Electronically Signed   By: Macy Mis M.D.   On: 09/20/2021 16:58   DG Abd Portable 1V  Result Date: 09/20/2021 CLINICAL DATA:  26 year old male status post orogastric tube placement. EXAM: PORTABLE ABDOMEN - 1 VIEW COMPARISON:  None. FINDINGS: Nonobstructive bowel gas pattern. The stomach is decompressed. Gastric decompression tube distal tip is positioned in the second portion the duodenum. IMPRESSION: Gastric decompression tube distal tip is position within the second portion of the duodenum. Recommend retraction by approximately 15 cm reposition within the gastric body. Electronically Signed   By: Ruthann Cancer M.D.   On: 09/20/2021 16:57   CT HEAD CODE STROKE WO CONTRAST  Addendum Date: 09/20/2021   ADDENDUM REPORT: 09/20/2021 13:28 ADDENDUM: On further review and correlation with findings on CTA, there is suspected hypoattenuation of the right insula which may represent sequela of acute right MCA territory infarct. Streak artifact in this region does limit evaluation and recommend correlation with the forthcoming/ordered MRI. Addendum discussed with Dr. Cheral Marker via telephone at 1:20 PM. Electronically Signed   By: Margaretha Sheffield M.D.   On: 09/20/2021 13:28   Result Date: 09/20/2021 CLINICAL DATA:  Code stroke.  Neuro deficit, acute, stroke suspected EXAM: CT HEAD WITHOUT CONTRAST TECHNIQUE: Contiguous axial images were obtained from the base of the skull through the vertex without intravenous contrast. RADIATION DOSE REDUCTION: This exam was performed according to the departmental dose-optimization program which includes automated exposure control, adjustment of the mA and/or kV according to patient size and/or use of iterative reconstruction technique. COMPARISON:  None. FINDINGS: Mildly motion limited study.  Within this limitation: Brain: No evidence of acute infarction, hemorrhage, hydrocephalus, extra-axial collection or mass lesion/mass effect. Vascular: No hyperdense vessel identified.  Skull: No acute fracture. Sinuses/Orbits: Bilateral inferior maxillary sinus mucosal thickening. Unremarkable orbits. Other: No mastoid effusions. ASPECTS [facility] Stroke Program Early CT Score) total score (0-10 with 10 being normal): 10. IMPRESSION: No evidence of acute intracranial abnormality on this mildly motion limited study. Code stroke imaging results were communicated on 09/20/2021 at 11:57 am to provider Dr. Cheral Marker via secure text paging. Electronically Signed: By: Margaretha Sheffield M.D. On: 09/20/2021 11:58   CT ANGIO HEAD NECK W WO CM W PERF (CODE STROKE)  Result Date: 09/20/2021 CLINICAL DATA:  Neuro deficit, acute, stroke suspected EXAM: CT ANGIOGRAPHY HEAD AND NECK CT PERFUSION BRAIN TECHNIQUE: Multidetector CT imaging of the head and neck was performed using the standard protocol during bolus administration of intravenous contrast. Multiplanar CT image reconstructions and MIPs were obtained to evaluate the vascular anatomy. Carotid stenosis measurements (when applicable) are obtained utilizing NASCET criteria, using the distal internal carotid diameter as the denominator. Multiphase  CT imaging of the brain was performed following IV bolus contrast injection. Subsequent parametric perfusion maps were calculated using RAPID software. RADIATION DOSE REDUCTION: This exam was performed according to the departmental dose-optimization program which includes automated exposure control, adjustment of the mA and/or kV according to patient size and/or use of iterative reconstruction technique. CONTRAST:  180mL OMNIPAQUE IOHEXOL 350 MG/ML SOLN COMPARISON:  None. FINDINGS: CTA NECK FINDINGS Severely motion limited study. The visualized common carotid arteries and internal carotid arteries appear to be grossly patent. Nondiagnostic evaluation of the vertebral arteries. Also, nondiagnostic evaluation for stenosis. Review of the MIP images confirms the above findings CTA HEAD FINDINGS Severely limited study due  to motion and venous timing. Within this limitation: Anterior circulation: The visible intracranial ICAs appear grossly patent. Bilateral M1 MCAs, A1 ACAs, and proximal A2 ACAs appear to be grossly patent. Evaluation of the more distal vessels is severely limited, including M2 MCAs and distal ACAs with suspected right superior M2 MCA occlusion or severe stenosis. Posterior circulation: Essentially nondiagnostic evaluation of the vertebral arteries due to motion and streak. The visible basilar artery and proximal posterior cerebral arteries appear to be grossly patent. Nondiagnostic evaluation of the distal PCAs due to motion. Venous sinuses: As permitted by contrast timing, patent. Review of the MIP images confirms the above findings CT Brain Perfusion Findings: ASPECTS: 10 CBF (<30%) Volume: 12mL Perfusion (Tmax>6.0s) volume: 63mL Mismatch Volume: 91mL IMPRESSION: CT perfusion: Motion limited with RAPID reporting areas of penumbra in the right anterior MCA territory and the left posterior MCA territory. Overall penumbra is estimated to be 17 mL. No core infarct identified. CTA head: Severely limited study due to motion and venous timing. M1 MCAs patent with suspected right superior M2 MCA occlusion or severe stenosis in the region of right anterior MCA territory penumbra described above. CTA neck: Severely motion limited study. The visualized common carotid arteries and internal carotid arteries appear to be grossly patent. Nondiagnostic evaluation of the vertebral arteries. Also, nondiagnostic evaluation for stenosis. Preliminary findings discussed with Dr. Cheral Marker via telephone at 12:12 p.m. Perfusion findings discussed at 12:45 p.m. Electronically Signed   By: Margaretha Sheffield M.D.   On: 09/20/2021 12:52    PHYSICAL EXAM  Physical Exam  Constitutional: Appears well-developed and well-nourished.  Psych: Affect appropriate to situation Cardiovascular: Normal rate and regular rhythm.  Respiratory:  Mechanically assisted, synchronizing with ventilator  Neuro: Mental Status: Sedated with propofol and intubated, right corneal intact, left sluggish Oculocephalic reflex intact, restless with stimulation, no commands Cranial Nerves: II: Pupils are equal, round, and reactive to light.   III,IV, VI: Eyes midline V: Facial sensation is symmetric VII: Facial movement appears symmetric VIII: No response to verbal stimuli X: Cough and gag intact XI: Head is midline XII: Unable to protrude tongue Motor: Tone is normal. Bulk is normal.  RUE and RLE 5/5 LUE and LLE 3/5 Moves all extremities purposefully  Sensory: Withdraws to pain in all extremities  Plantars: Toes are downgoing bilaterally.    ASSESSMENT/PLAN Carlos Mckinney is a 26 y.o. male with history of CHF, cardiogenic shock, DVT on Eliquis, cocaine use presenting after going to sleep at 2200 on 2/20.  Family states that at 4 AM they heard him fall and got back into bed, they saw him again at 8 AM and he was slurring words, had left-sided weakness, difficulty walking and was acting "strange".  Initial NIH SS 24.  Decreased level consciousness, disoriented, not following commands, right gaze preference, left hemianopsia,  left facial droop, left arm weakness, left leg weakness, decreased sensation on the left, global aphasia, sensory neglect.  Not a candidate for TNK due to Eliquis, taken to IR emergently.  Sedation adjusted today in hopes of extubation.  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 Small vessel disease. Atrophy. ASPECTS 10.    CTA head & neck M1 MCAs patent with suspected right superior M2 MCA occlusion or severe stenosis in the region of right anterior MCA territory penumbra CT perfusion penumbra in the right anterior MCA territory and the left posterior MCA territory.  Overall penumbra is estimated to be 17 mL. No core infarct identified Post IR CT No ICH MRI large area of restricted diffusion in the right MCA territory, moderate to large acute infarct in the left temporal lobe  2D Echo 09/02/2021-EF less than 20%, LV global hypokinesis, severe tricuspid regurgitation LDL 45 HgbA1c 4.5 VTE prophylaxis -SCDs Eliquis (apixaban) daily prior to admission, now on aspirin 325 mg daily.  May consider resume anticoagulation in 5 to 7 days to lower risk of hemorrhagic transformation. Therapy recommendations:  pending Disposition: Pending  Congestive heart failure with reduced EF Home medications: Digoxin, Jardiance, Entresto, Lasix, spironolactone Last echo 2/3-EF less than 20% Recently admitted for heart failure from 2/2 - 2/8 under Dr. 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 1/7-08/08/2021- admitted to [facility] with LE swelling and dyspnea- RLE DVT and b/l pulm emboli Was discharged on Eliquis Wife is unsure if he was taking his Eliquis appropriately, he did report noticing blood in his stool recently and had discussed stopping Eliquis during last hospitalization  Respiratory failure Intubated for procedure and airway protection Remain intubated after procedure due to mental status Now on ventilation with sedation CCM on board Plan to switch sedation to Precedex and wean off ventilation as able  Hypotension BP goal 120-140 Norepinephrine infusing CCM adjusting heart failure medications in order to maintain blood pressure at goal  Cocaine abuse UDS positive for cocaine Cessation education will be provided  Other Stroke Risk Factors Advanced Age >/= 63  Cigarette smoker, advised to stop smoking ETOH use, alcohol level <10, advised to drink no more than 2 drink(s) a day THC abuse - UDS:  THC POSITIVE. Patient will be advised to stop using due to stroke risk.  Other Active Problems CHF with EF less than 20%.  CCM is to call  cardiology for recommendation.  Hospital day # 1  Patient seen and examined by NP/APP with MD. MD to update note as needed.   Janine Ores, DNP, FNP-BC [facility] Pager: [phone]  ATTENDING NOTE: I reviewed above note and agree with the assessment and plan. Pt was seen and examined.   26 year old male with history of right lower extremity DVT and bilateral PE in 1/23, CHF with low EF not compliant with Eliquis, smoker, substance abuse admitted for left-sided weakness, aphasia and lethargy.  CT showed right insular cortex and left temporal lobe hypodensity.  CT head and neck right M2 occlusion.  CTP 0/17 cc.  Stat post IR with TICI2c.  MRI showed bilateral MCA large infarct.  LDL 45, A1c pending.  Creatinine 0.99.  UDS positive for cocaine and THC.  Hemoglobin 10.1.  He was admitted in 07/2021 for distal right DVT and bilateral PE.  He was discharged on Eliquis.  However per wife,  patient had hemorrhoid bleeding so his Eliquis was stopped by himself.  Earlier this month, he was admitted for CHF, put on Entresto, spironolactone, Coreg and digoxin.  He was encouraged to continue Eliquis.  However, wife not sure whether he is taking Eliquis at home.  On exam today, patient still intubated on sedation, not open eyes on voice, not following commands.  With forced eye opening, bilateral pupil equal reactive, sluggish doll's eyes, not blinking to visual threat.  Corneal reflexes positive on the right but absent on the left.  Gag and cough reflex present.  With painful summation, patient withdraw on the right but not on the left.  Etiology for patient bilateral stroke likely due to cardioembolic source with cardiomyopathy, low EF, noncompliant with Eliquis.  Cocaine use and smoking cigarette also risk factor for stroke.  Put on aspirin 325, hold off" for now given risk of hemorrhagic transformation.  Management per CCM, plan to transition sedation to Precedex for weaning of ventilation.   BP goal 120s 140, currently on Levophed, CHF meds continued with digoxin and Jardiance but others on hold for BP control.  Pending cardiology consultation.  THC and cocaine cessation education will be provided.  PT/OT pending.  For detailed assessment and plan, please refer to above as I have made changes wherever appropriate.   Rosalin Hawking, MD PhD Stroke Neurology 09/21/2021 6:10 PM  This patient is critically ill due to large bilateral infarcts, cocaine use, cardiomyopathy with low EF, DVT/PE on Eliquis at home, respite failure on vent and at significant risk of neurological worsening, death form recurrent stroke, hemorrhagic transformation, seizure, heart failure, renal failure. This patient's care requires constant monitoring of vital signs, hemodynamics, respiratory and cardiac monitoring, review of multiple databases, neurological assessment, discussion with family, other specialists and medical decision making of high complexity. I spent 45 minutes of neurocritical care time in the care of this patient. I had long discussion with wife at bedside, updated pt current condition, treatment plan and potential prognosis, and answered all the questions.  She expressed understanding and appreciation.  I also discussed with Dr. Lynetta Mare CCM.     To contact Stroke Continuity provider, please refer to [email or web address] After hours, contact General Neurology

## 2021-09-21 NOTE — Plan of Care (Signed)
Plan of care discussed with family.

## 2021-09-21 NOTE — Progress Notes (Signed)
OT Cancellation Note  Patient Details Name: Carlos Mckinney MRN: [id number] DOB: 04/21/1996   Cancelled Treatment:    Reason Eval/Treat Not Completed: Active bedrest order. Will assess when activity orders updated.   Thornell Mule, OT/L   Acute OT Clinical Specialist Acute Rehabilitation Services Pager [phone] Office [phone]  09/21/2021, 9:24 AM

## 2021-09-21 NOTE — Progress Notes (Signed)
Patient transported from 4N20 to MRI and back with no complications. 

## 2021-09-21 NOTE — Progress Notes (Signed)
SLP Cancellation Note  Patient Details Name: Carlos Mckinney MRN: [id number] DOB: 14-Apr-1996   Cancelled treatment:       Reason Eval/Treat Not Completed: Patient not medically ready   Angelisse Riso, Riley Nearing 09/21/2021, 7:49 AM

## 2021-09-21 NOTE — Progress Notes (Signed)
PT Cancellation Note  Patient Details Name: PHARAOH PIO MRN: [id number] DOB: 1996-06-29   Cancelled Treatment:    Reason Eval/Treat Not Completed: Active bedrest order. Will reassess when pt is off bedrest and appropriate to mobilize.   Arlyss Gandy 09/21/2021, 1:34 PM

## 2021-09-21 NOTE — Progress Notes (Signed)
NAME:  Carlos Mckinney, MRN:  [id number], DOB:  September 01, 1995, LOS: 1 ADMISSION DATE:  09/20/2021, CONSULTATION DATE:  2/21 REFERRING MD:  Corliss Skains, CHIEF COMPLAINT:  AMS   History of Present Illness:  Carlos Mckinney is a 26 y.o. male with past medical hx of CHF and cardiogenic shock, DVT on eliquis, tobacco use, cocaine abuse, and obesity presenting to [facility] 2/21   Patient from home where he was LKW at 2200 on 09/19/2021 and now complaining of not answering questions, left sided weakness, acting strange per family. Patient reports that he was at his baseline last night when he went to bed at 2200. At 0400, they heard him fall and he got back in bed. She saw him again at [facility] and patient was slurring words with had difficulty walking at that time.   Stroke team at the bedside on patient arrival. Labs drawn and patient cleared for CT by Dr. Anitra Lauth. Patient to CT with team. NIHSS 24, see documentation for details and code stroke times. Patient with decreased LOC, disoriented, not following commands, right gaze preference , left hemianopia, left facial droop, left arm weakness, left leg weakness, left decreased sensation, Global aphasia , and Sensory  neglect on exam. The following imaging was completed:  CT, CTA head and neck, CTP. Patient is not a candidate for IV Thrombolytic due to being on Eliquis.   Patient taken to IR.  Pertinent  Medical History  Prior DVT on Eliquis HFrEF (EF<20%)  Significant Hospital Events: Including procedures, antibiotic start and stop dates in addition to other pertinent events   2/21 intubated for airway protection, rt cc arteriogram   Interim History / Subjective:  UA Positive for cocaine and tetrahydrocannabinol 40-50 BPM when placed on pressure support bipap  Objective   Blood pressure 122/75, pulse (!) 108, temperature 98.8 F (37.1 C), temperature source Axillary, resp. rate (!) 26, height 5\' 11"  (1.803 m), weight 93.9 kg, SpO2 97 %.    Vent Mode:  PRVC FiO2 (%):  [40 %-100 %] 40 % Set Rate:  [15 bmp-20 bmp] 20 bmp Vt Set:  [600 mL] 600 mL PEEP:  [5 cmH20] 5 cmH20 Plateau Pressure:  [15 cmH20-21 cmH20] 15 cmH20   Intake/Output Summary (Last 24 hours) at 09/21/2021 0850 Last data filed at 09/21/2021 0600 Gross per 24 hour  Intake 1966.99 ml  Output 2625 ml  Net -658.01 ml    Filed Weights   09/20/21 1100 09/20/21 1545  Weight: 92 kg 93.9 kg    Examination: General:  In bed on vent, sedated HENT: NCAT ETT in place PULM: diffuse rhonchi present bilaterally, vent supported breathing CV: RRR, no mgr GI: BS+, soft, nontender MSK: normal bulk and tone, R>L Neuro: sedated on vent, pupils equal and reactive to light, babinski downgoing, no purposeful movement -corneal reflex, strong cough reflex    Glucose- 117 INR 1.3-> 1.4 Hgb 12.9-> 10.5>10.1 Albumin 3.1>2.6  CT head- hypoattenuation of right insula, possible acute r MCA territory infarct  Rt common carotid arteriogram- occluded right MCA sup division prox  Resolved Hospital Problem list     Assessment & Plan:  Acute right MCA territory ischemic stroke s/p right common carotid arteriogram Patient presented with focal deficits, aphasia, and AMS. CTA head showed hypoattentuation of right insula. IR consulted and completed Right common carotid arteriogram s/p complete revascularization. -Neurology following , appreciate their recommendations -F/u MRI head -SBP goal 120-140s -Levophed PRN to maintain systolic pressures -PT eval and treat -OT eval and treat  Acute encephalopathy High risk for aspiration Patient intubated for airway protection. Switch Will extubate as soon as possible. Reduce sedation to determine a comfortable baseline on vent prior to SBT - Precedex 400 MCG -SAT and SBT as able  Heart failure with reduced EF Non-ischemic Last echo 09/02/21 with EF <20%, and moderately reduced right ventricular systolic function. Follows with heart failure  team. -Heart failure team consulted -clevidipine -Holding home medications include digoxin 0.125 mg, jardiance 10 mg, entresto 49-51 mg, Lasix 40 mg, spironolactone 25 mg and consider resuming once post extubation  History of acute DVT 01/23 Patient on Eliquis. Holding in setting of acute CVA.  Normocytic anemia Elevated INR Hgb 10.5, MCV 90.2. baseline Hgb around 11. INR at 1.3. Tbili elevated 1.7. -trend CBC -transfuse if <7.0 -repeat CMP in AM  Diabetes Insulin with glucose goal 120-140   Best Practice (right click and "Reselect all SmartList Selections" daily)   Diet/type: NPO DVT prophylaxis: not indicated GI prophylaxis: PPI Lines: Arterial Line Foley:  Yes, and it is still needed Code Status:  full code Last date of multidisciplinary goals of care discussion [updated mother over phone 2/21]  [place]

## 2021-09-22 ENCOUNTER — Other Ambulatory Visit (HOSPITAL_COMMUNITY): Payer: Self-pay

## 2021-09-22 DIAGNOSIS — I5021 Acute systolic (congestive) heart failure: Secondary | ICD-10-CM

## 2021-09-22 HISTORY — PX: IR CT HEAD LTD: IMG2386

## 2021-09-22 LAB — CBC WITH DIFFERENTIAL/PLATELET
Abs Immature Granulocytes: 0.01 10*3/uL (ref 0.00–0.07)
Basophils Absolute: 0 10*3/uL (ref 0.0–0.1)
Basophils Relative: 1 %
Eosinophils Absolute: 0.2 10*3/uL (ref 0.0–0.5)
Eosinophils Relative: 2 %
HCT: 34 % — ABNORMAL LOW (ref 39.0–52.0)
Hemoglobin: 10.5 g/dL — ABNORMAL LOW (ref 13.0–17.0)
Immature Granulocytes: 0 %
Lymphocytes Relative: 14 %
Lymphs Abs: 0.9 10*3/uL (ref 0.7–4.0)
MCH: 28.1 pg (ref 26.0–34.0)
MCHC: 30.9 g/dL (ref 30.0–36.0)
MCV: 90.9 fL (ref 80.0–100.0)
Monocytes Absolute: 0.5 10*3/uL (ref 0.1–1.0)
Monocytes Relative: 8 %
Neutro Abs: 5 10*3/uL (ref 1.7–7.7)
Neutrophils Relative %: 75 %
Platelets: 270 10*3/uL (ref 150–400)
RBC: 3.74 MIL/uL — ABNORMAL LOW (ref 4.22–5.81)
RDW: 24 % — ABNORMAL HIGH (ref 11.5–15.5)
Smear Review: ADEQUATE
WBC: 6.7 10*3/uL (ref 4.0–10.5)
nRBC: 0 % (ref 0.0–0.2)

## 2021-09-22 LAB — BASIC METABOLIC PANEL
Anion gap: 10 (ref 5–15)
BUN: 5 mg/dL — ABNORMAL LOW (ref 6–20)
CO2: 21 mmol/L — ABNORMAL LOW (ref 22–32)
Calcium: 9 mg/dL (ref 8.9–10.3)
Chloride: 105 mmol/L (ref 98–111)
Creatinine, Ser: 1.07 mg/dL (ref 0.61–1.24)
GFR, Estimated: >60 mL/min (ref >60)
Glucose, Bld: 86 mg/dL (ref 70–99)
Potassium: 4.1 mmol/L (ref 3.5–5.1)
Sodium: 136 mmol/L (ref 135–145)

## 2021-09-22 LAB — GLUCOSE, CAPILLARY
Glucose-Capillary: 83 mg/dL (ref 70–99)
Glucose-Capillary: 86 mg/dL (ref 70–99)
Glucose-Capillary: 81 mg/dL (ref 70–99)
Glucose-Capillary: 75 mg/dL (ref 70–99)
Glucose-Capillary: 78 mg/dL (ref 70–99)

## 2021-09-22 MED ORDER — ENOXAPARIN SODIUM 40 MG/0.4ML IJ SOSY
40.0000 mg | PREFILLED_SYRINGE | INTRAMUSCULAR | Status: DC
  Administered 2021-09-23: 40 mg via SUBCUTANEOUS
  Filled 2021-09-22: qty 0.4

## 2021-09-22 MED ORDER — SACUBITRIL-VALSARTAN 49-51 MG PO TABS
1.0000 | ORAL_TABLET | Freq: Two times a day (BID) | ORAL | Status: DC
  Administered 2021-09-22 – 2021-09-24 (×4): 1 via ORAL
  Filled 2021-09-22 (×9): qty 1

## 2021-09-22 MED ORDER — SPIRONOLACTONE 25 MG PO TABS
25.0000 mg | ORAL_TABLET | Freq: Every day | ORAL | Status: DC
Start: 2021-09-22 — End: 2021-09-24
  Administered 2021-09-22 – 2021-09-24 (×3): 25 mg via ORAL
  Filled 2021-09-22 (×3): qty 1

## 2021-09-22 NOTE — Evaluation (Signed)
Physical Therapy Evaluation Patient Details Name: Carlos Mckinney MRN: [id number] DOB: 1996/01/10 Today's Date: 09/22/2021  History of Present Illness  Pt is 26 yo male with R gaze preference, LUE and LE weakness, global aphasia, and agitation. Pt taken to IR with revascularization of R MCA.   PMH: advanced CHF, tobacco abuse, cocaine abuse, obesity, DVT.  Clinical Impression  PT presents to PT with deficits in functional mobility, gait, balance, cognition, attention and awareness. Pt with poor awareness of medical situation and deficits. Pt insistent on wanting to don his clothes and go home. Pt with reduced attention to left side, often laying on left arm. Pt with instability during standing and some ideational apraxia, unable to don socks. Pt will benefit from frequent mobilization in an effort to improve balance and raise awareness of deficits.       Recommendations for follow up therapy are one component of a multi-disciplinary discharge planning process, led by the attending physician.  Recommendations may be updated based on patient status, additional functional criteria and insurance authorization.  Follow Up Recommendations Acute inpatient rehab (3hours/day)    Assistance Recommended at Discharge Frequent or constant Supervision/Assistance  Patient can return home with the following  A lot of help with walking and/or transfers;A lot of help with bathing/dressing/bathroom;Assistance with cooking/housework;Assistance with feeding;Direct supervision/assist for medications management;Direct supervision/assist for financial management;Assist for transportation;Help with stairs or ramp for entrance    Equipment Recommendations  (TBD)  Recommendations for Other Services  Rehab consult    Functional Status Assessment Patient has had a recent decline in their functional status and demonstrates the ability to make significant improvements in function in a reasonable and predictable amount of  time.     Precautions / Restrictions Precautions Precautions: Fall Restrictions Weight Bearing Restrictions: No      Mobility  Bed Mobility Overal bed mobility: Needs Assistance Bed Mobility: Supine to Sit, Sit to Supine     Supine to sit: Supervision, HOB elevated Sit to supine: +2 for physical assistance, Total assist   General bed mobility comments: pt resistant to returning to supine    Transfers Overall transfer level: Needs assistance Equipment used: 1 person hand held assist Transfers: Sit to/from Stand Sit to Stand: Min assist                Ambulation/Gait Ambulation/Gait assistance: Min assist Gait Distance (Feet): 4 Feet (4' forward and backward twice) Assistive device: None Gait Pattern/deviations: Step-to pattern Gait velocity: reduced Gait velocity interpretation: <1.31 ft/sec, indicative of household ambulator   General Gait Details: pt with slowed step-to gait, increased lateral sway, multiple verbal cues to sequence transition from forward to backward  Stairs            Wheelchair Mobility    Modified Rankin (Stroke Patients Only) Modified Rankin (Stroke Patients Only) Pre-Morbid Rankin Score: No symptoms Modified Rankin: Moderately severe disability     Balance Overall balance assessment: Needs assistance Sitting-balance support: No upper extremity supported, Feet supported Sitting balance-Leahy Scale: Fair     Standing balance support: No upper extremity supported, During functional activity Standing balance-Leahy Scale: Poor                               Pertinent Vitals/Pain Pain Assessment Pain Assessment: Faces Faces Pain Scale: No hurt    Home Living Family/patient expects to be discharged to:: Private residence Living Arrangements: Spouse/significant other Available Help at Discharge: Family;Available 24 hours/day  Type of Home: House Home Access: Ramped entrance       Home Layout: One  level Home Equipment: [place] - single point      Prior Function Prior Level of Function : Independent/Modified Independent                     Hand Dominance   Dominant Hand: Right    Extremity/Trunk Assessment   Upper Extremity Assessment Upper Extremity Assessment: LUE deficits/detail LUE Deficits / Details: pt with poor attention to LUE, difficult to formally assess sensation due to cognitive deficits. Does move extremity against gravity.    Lower Extremity Assessment Lower Extremity Assessment: Generalized weakness;Difficult to assess due to impaired cognition (possibly impaired attention to LLE)    Cervical / Trunk Assessment Cervical / Trunk Assessment: Normal  Communication   Communication: Expressive difficulties;Receptive difficulties  Cognition Arousal/Alertness: Awake/alert Behavior During Therapy: Impulsive Overall Cognitive Status: Impaired/Different from baseline Area of Impairment: Orientation, Memory, Attention, Following commands, Safety/judgement, Awareness, Problem solving                 Orientation Level: Disoriented to, Place, Situation Current Attention Level: Focused Memory: Decreased recall of precautions, Decreased short-term memory Following Commands: Follows one step commands inconsistently Safety/Judgement: Decreased awareness of safety, Decreased awareness of deficits Awareness: Intellectual Problem Solving: Slow processing, Decreased initiation, Difficulty sequencing, Requires verbal cues, Requires tactile cues General Comments: pt perseverating on wanting to find his clothes, not easily redirectible        General Comments General comments (skin integrity, edema, etc.): VSS on RA, poor awareness of deficits, L inattention    Exercises     Assessment/Plan    PT Assessment Patient needs continued PT services  PT Problem List Decreased strength;Decreased activity tolerance;Decreased mobility;Decreased balance;Decreased  cognition;Decreased knowledge of use of DME;Decreased safety awareness;Decreased knowledge of precautions;Impaired sensation       PT Treatment Interventions DME instruction;Gait training;Stair training;Functional mobility training;Therapeutic activities;Therapeutic exercise;Balance training;Neuromuscular re-education;Cognitive remediation;Patient/family education    PT Goals (Current goals can be found in the Care Plan section)  Acute Rehab PT Goals Patient Stated Goal: to go home PT Goal Formulation: With patient Time For Goal Achievement: 10/06/21 Potential to Achieve Goals: Fair    Frequency Min 4X/week     Co-evaluation               AM-PAC PT "6 Clicks" Mobility  Outcome Measure Help needed turning from your back to your side while in a flat bed without using bedrails?: None Help needed moving from lying on your back to sitting on the side of a flat bed without using bedrails?: A Little Help needed moving to and from a bed to a chair (including a wheelchair)?: A Little Help needed standing up from a chair using your arms (e.g., wheelchair or bedside chair)?: A Little Help needed to walk in hospital room?: Total Help needed climbing 3-5 steps with a railing? : Total 6 Click Score: 15    End of Session   Activity Tolerance: Patient tolerated treatment well Patient left: in bed;with call bell/phone within reach;with bed alarm set;with family/visitor present;with restraints reapplied Nurse Communication: Mobility status PT Visit Diagnosis: Other abnormalities of gait and mobility (R26.89);Other symptoms and signs involving the nervous system (R29.898);Muscle weakness (generalized) (M62.81)    Time: [id number] PT Time Calculation (min) (ACUTE ONLY): 32 min   Charges:   PT Evaluation $PT Eval Moderate Complexity: [place],  PT, DPT Acute Rehabilitation Pager: [phone] Office [place] 09/22/2021, 1:05 PM

## 2021-09-22 NOTE — Progress Notes (Signed)
Patient communicates better if you write what you have to say down and he can read it. Otherwise, he does not understand what is being said to him at this time.

## 2021-09-22 NOTE — Progress Notes (Signed)
Failed Genoveva Ill test SP therapy order

## 2021-09-22 NOTE — Progress Notes (Signed)
? ?  Inpatient Rehab Admissions Coordinator : ? ?Per therapy recommendations, patient was screened for CIR candidacy by Yicel Shannon RN MSN.  At this time patient appears to be a potential candidate for CIR. I will place a rehab consult per protocol for full assessment. Please call me with any questions. ? ?Faizon Capozzi RN MSN ?Admissions Coordinator ?[phone] ?  ?

## 2021-09-22 NOTE — TOC CAGE-AID Note (Signed)
Transition of Care [facility]) - CAGE-AID Screening   Patient Details  Name: Carlos Mckinney MRN: [id number] Date of Birth: 1995/10/08  Transition of Care [facility]) CM/SW Contact:    Kellee Sittner C Tarpley-Carter, LCSWA Phone Number: 09/22/2021, 2:37 PM   Clinical Narrative: Pt participated in Cage-Aid.  Pt stated he does use substance.  Pt was offered resources, due to usage of substance.    Leotta Weingarten Tarpley-Carter, MSW, LCSW-A Pronouns:  She/Her/Hers [place] Transitions of Care Clinical Social Worker Direct Number:  [phone] [email or web address]     CAGE-AID Screening: Substance Abuse Screening unable to be completed due to: : Patient unable to participate  Have You Ever Felt You Ought to Cut Down on Your Drinking or Drug Use?: Yes Have People Annoyed You By Critizing Your Drinking Or Drug Use?: No Have You Felt Bad Or Guilty About Your Drinking Or Drug Use?: No Have You Ever Had a Drink or Used Drugs First Thing In The Morning to Steady Your Nerves or to Get Rid of a Hangover?: No CAGE-AID Score: 1  Substance Abuse Education Offered: Yes  Substance abuse interventions: Transport planner

## 2021-09-22 NOTE — Procedures (Signed)
Extubation Procedure Note  Patient Details:   Name: Carlos Mckinney DOB: 10-30-1995 MRN: [id number]   Airway Documentation:    Vent end date: 09/22/21 Vent end time: 0810   Evaluation  O2 sats: stable throughout Complications: No apparent complications Patient did tolerate procedure well. Bilateral Breath Sounds: Clear, Diminished   Yes Audible cuffleak was heard prior extubation and no signs of stridor at this time. Pt is currently stable on 4L . Pt is able to speak and state name. RT will continue to monitor   Jolayne Panther 09/22/2021, 8:22 AM

## 2021-09-22 NOTE — Progress Notes (Signed)
NAME:  Carlos Mckinney, MRN:  [id number], DOB:  [birth year], LOS: 2 ADMISSION DATE:  09/20/2021, CONSULTATION DATE:  2/21 REFERRING MD:  Estanislado Pandy, CHIEF COMPLAINT:  AMS   History of Present Illness:  Carlos Mckinney is a 26 y.o. male with past medical hx of CHF and cardiogenic shock, DVT on eliquis, tobacco use, cocaine abuse, and obesity presenting to [facility] 2/21   Patient from home where he was LKW at 2200 on 09/19/2021 and now complaining of not answering questions, left sided weakness, acting strange per family. Patient reports that he was at his baseline last night when he went to bed at 2200. At 0400, they heard him fall and he got back in bed. She saw him again at [facility] and patient was slurring words with had difficulty walking at that time.   Stroke team at the bedside on patient arrival. Labs drawn and patient cleared for CT by Dr. Maryan Rued. Patient to CT with team. NIHSS 24, see documentation for details and code stroke times. Patient with decreased LOC, disoriented, not following commands, right gaze preference , left hemianopia, left facial droop, left arm weakness, left leg weakness, left decreased sensation, Global aphasia , and Sensory  neglect on exam. The following imaging was completed:  CT, CTA head and neck, CTP. Patient is not a candidate for IV Thrombolytic due to being on Eliquis.   Patient taken to IR.  Pertinent  Medical History  Prior DVT on Eliquis HFrEF (EF<20%)  Significant Hospital Events: Including procedures, antibiotic start and stop dates in addition to other pertinent events   2/21 intubated for airway protection, rt cc arteriogram   Interim History / Subjective:  Woke up last night and this morning. Alert, with purposeful movements and trying to communicate. Extubated, patient was confused, but asking for his mother, if he's going to get better, and if he can use the bathroom. Does not seem fully receptive to commands.  Objective   Blood pressure 111/69, pulse  65, temperature (!) 97.5 F (36.4 C), temperature source Axillary, resp. rate 17, height 5\' 11"  (1.803 m), weight 93.9 kg, SpO2 99 %.    Vent Mode: PRVC FiO2 (%):  [40 %] 40 % Set Rate:  [15 bmp] 15 bmp Vt Set:  [600 mL-609 mL] 600 mL PEEP:  [5 cmH20] 5 cmH20 Plateau Pressure:  [16 cmH20-19 cmH20] 16 cmH20   Intake/Output Summary (Last 24 hours) at 09/22/2021 0806 Last data filed at 09/22/2021 0700 Gross per 24 hour  Intake 731.63 ml  Output 1950 ml  Net -1218.37 ml    Filed Weights   09/20/21 1100 09/20/21 1545  Weight: 92 kg 93.9 kg    Examination: General:  awake, alert, waxing and waning consciousness. HENT: 4L nasal canula, PRRL PULM: diffuse rhonchi present bilaterally, vent supported breathing CV: RRR, no mgr GI: BS+, soft, nontender MSK: normal bulk and tone, R>L Neuro: wake, alert, occasionally following commands, asking questions, purposeful movement-corneal reflex, strong cough reflex    Glucose- 86 INR 1.3-> 1.4 Hgb 12.9-> 10.5>10.1>10.5 Albumin 3.1>2.6  CT head- hypoattenuation of right insula, possible acute r MCA territory infarct  Rt common carotid arteriogram- occluded right MCA sup division prox  Resolved Hospital Problem list     Assessment & Plan:  Acute right MCA territory ischemic stroke s/p right common carotid arteriogram Patient presented with focal deficits, aphasia, and AMS. CTA head showed hypoattentuation of right insula. IR consulted and completed Right common carotid arteriogram s/p complete revascularization. -Neurology following , appreciate  their recommendations -F/u MRI head -SBP goal 120-140s -Levophed PRN to maintain systolic pressures -PT eval and treat -OT eval and treat  Acute encephalopathy High risk for aspiration Patient was intubated for airway protection. Was extubated today 2/23 - wean precedex   Heart failure with reduced EF Non-ischemic Last echo 09/02/21 with EF <20%, and moderately reduced right ventricular  systolic function. Follows with heart failure team. -Heart failure team consulted -clevidipine -continue digoxin and empagliflozin from home meds -Holding other home medications and will begin resuming post swallow eval History of acute DVT 01/23 Patient on Eliquis. Holding in setting of acute CVA.  Normocytic anemia Elevated INR Hgb 10.5, MCV 90.2. baseline Hgb around 11. INR at 1.3. Tbili elevated 1.7. -trend CBC -transfuse if <7.0 -repeat CMP in AM  Cocaine use Caseation counseling   Diabetes Insulin with glucose goal 120-140   Best Practice (right click and "Reselect all SmartList Selections" daily)   Diet/type: NPO DVT prophylaxis: not indicated GI prophylaxis: PPI Lines: Arterial Line Foley:  Yes, and it is still needed Code Status:  full code Last date of multidisciplinary goals of care discussion [updated mother over phone 2/21]  Desha

## 2021-09-22 NOTE — Progress Notes (Addendum)
STROKE TEAM PROGRESS NOTE   INTERVAL HISTORY His wife is at the bedside. Pt has been extubated, tolerating well. Per wife, he was able to have conversation with RN earlier today. However, on my exam, he was sleepy and not cooperative with exam. BP improved, off pressor.   Vitals:   09/22/21 0800 09/22/21 0819 09/22/21 1000 09/22/21 1100  BP: 103/70  107/68 102/65  Pulse: 81 88 77 99  Resp: (!) 26 (!) 24 (!) 29 (!) 31  Temp: 98.7 F (37.1 C)     TempSrc: Axillary     SpO2: 100% 97% 100% 94%  Weight:      Height:       CBC:  Recent Labs  Lab 09/21/21 0556 09/22/21 0548  WBC 7.3 6.7  NEUTROABS 5.3 5.0  HGB 10.1* 10.5*  HCT 32.2* 34.0*  MCV 89.7 90.9  PLT 321 270   Basic Metabolic Panel:  Recent Labs  Lab 09/21/21 0556 09/22/21 0548  NA 137 136  K 4.0 4.1  CL 107 105  CO2 22 21*  GLUCOSE 102* 86  BUN 5* 5*  CREATININE 0.99 1.07  CALCIUM 8.7* 9.0   Lipid Panel:  Recent Labs  Lab 09/21/21 0556  CHOL 87  TRIG 80  HDL 26*  CHOLHDL 3.3  VLDL 16  LDLCALC 45   HgbA1c:  Recent Labs  Lab 09/21/21 0557  HGBA1C 4.7*   Urine Drug Screen:  Recent Labs  Lab 09/20/21 1539  LABOPIA NONE DETECTED  COCAINSCRNUR POSITIVE*  LABBENZ NONE DETECTED  AMPHETMU NONE DETECTED  THCU POSITIVE*  LABBARB NONE DETECTED    Alcohol Level  Recent Labs  Lab 09/20/21 1534  ETH <10    IMAGING past 24 hours IR CT Head Ltd  Result Date: 09/22/2021 INDICATION: New onset right gaze deviation, left-sided weakness and slurred speech. Occluded superior division of the right middle cerebral artery proximally on CT angiogram of the head and neck. EXAM: 1. EMERGENT LARGE VESSEL OCCLUSION THROMBOLYSIS (anterior CIRCULATION) COMPARISON:  CT angiogram of the head and neck of September 20, 2021. MEDICATIONS: Ancef 2 g IV antibiotic was administered within 1 hour of the procedure. ANESTHESIA/SEDATION: General anesthesia. CONTRAST:  Omnipaque 300 approximately 100 mL. FLUOROSCOPY TIME:   Fluoroscopy Time: 18 minutes 0 seconds (1901 mGy). COMPLICATIONS: None immediate. TECHNIQUE: Following a full explanation of the procedure along with the potential associated complications, an informed witnessed consent was obtained. The risks of intracranial hemorrhage of 10%, worsening neurological deficit, ventilator dependency, death and inability to revascularize were all reviewed in detail with the patient's mother. The patient was then put under general anesthesia by the Department of Anesthesiology at [facility]. The right groin was prepped and draped in the usual sterile fashion. Thereafter using modified Seldinger technique, transfemoral access into the right common femoral artery was obtained without difficulty. Over a 0.035 inch guidewire an 8 French 25 Pinnacle sheath was inserted. Through this, and also over a 0.035 inch guidewire a 5 [place] JB 1 catheter was advanced to the aortic arch region and selectively positioned in the innominate artery and the right common carotid artery. FINDINGS: The innominate arteriogram demonstrates the origin of the right subclavian artery and the right common carotid artery to be widely patent. The right common carotid arteriogram demonstrates the right external carotid artery and its major branches to be widely patent. The right internal carotid artery at the bulb to the cranial skull base is widely patent. The petrous, the cavernous and the supraclinoid segments demonstrated  tortuosity with wide patency. The right anterior cerebral artery opacifies into the capillary and venous phases. The right middle cerebral artery demonstrates angiographic occlusion of the superior division proximally. PROCEDURE: Diagnostic JB 1 catheter in the right common carotid artery was then exchanged over a 0.035 inch 300 cm Rosen exchange guidewire for an 087 95 cm balloon guide catheter which had been prepped in 50% contrast and 50% heparinized saline infusion. The balloon guide  was advanced to the distal right internal carotid artery. The guidewire was removed. Good aspiration obtained from the hub of the balloon guide catheter. Gentle control arteriogram performed through this demonstrates mild spasm in the cervical right ICA which responded to 25 mcg of nitroglycerin intra-arterially. More distally, the middle cerebral artery and the anterior cerebral artery demonstrated no change. Over a 0.014 inch micro guidewire, an 021 Trakstar 160 cm microcatheter was then advanced inside of a 6 [place] 017 130 cm support catheter to the supraclinoid right ICA. The micro guidewire was then gently manipulated without difficulty with a torque device and advanced through the occluded superior division into the M2 M3 region followed by the microcatheter. An 071 aspiration catheter was advanced to the distal right M1 segment. A 3 mm x 40 mm Solitaire X retrieval device was then advanced to the distal end of the microcatheter. The O ring on the delivery microcatheter was loosened. With slight forward gentle traction with the right hand on the delivery micro guidewire with the left hand the delivery microcatheter was retrieved deploying the retrieval device. With proximal flow arrest in the right internal carotid artery, and constant aspiration applied at the hub of the 071 aspiration catheter with a Penumbra pump, and a 20 mL syringe at the hub of the balloon guide catheter for approximately 3 minutes, the retrieval device, the microcatheter, and the 071 aspiration catheter were retrieved and removed. Control arteriogram performed through the balloon guide catheter in the right internal carotid artery demonstrated moderate spasm in the cervical right ICA. However, flow was noted into the petrous, the cavernous and the supraclinoid segments with now patency noted of the superior division. Significant spasm was appreciated in the right middle cerebral artery distally, extending into the proximal superior  division. The balloon guide was retrieved more proximally into the right common carotid artery. Aliquots of 25 mcg of intra-arterial nitroglycerin was then given x5 with progressive improved caliber of the right internal carotid artery distally, and also the supraclinoid right ICA and revascularized superior division. A TICI 2C revascularization was achieved. The right anterior cerebral artery remained widely patent. The balloon guide was retrieved into the right common carotid artery. A control arteriogram performed through this demonstrated excellent flow through the extracranial and intracranial internal carotid artery. Wide patency was now noted of the right middle cerebral M1 segment, and the right recanalized superior division again maintaining a TICI 2C revascularization. Right anterior cerebral artery remained unchanged widely patent. The balloon guide was retrieved and removed. The 8 French Pinnacle sheath was removed with successful hemostasis at the right groin puncture site with an 8 French Angio-Seal closure device. Distal pulses remained palpable in both feet unchanged at the end of the procedure. A flat panel CT of the brain demonstrated no evidence of intracranial hemorrhage, or mass effect or of ventriculomegaly. Patient was left intubated in view of patient's medical condition, and the patient not being able to communicate at the beginning of the procedure. IMPRESSION: Status post endovascular complete revascularization of occluded right middle cerebral artery superior  division with 1 pass with a 3 mm x 40 mm Solitaire X retrieval device, and contact aspiration achieving a TICI 2C revascularization. PLAN: As per referring MD. Electronically Signed   By: Julieanne Cotton M.D.   On: 09/22/2021 10:55    PHYSICAL EXAM  Temp:  [97.5 F (36.4 C)-98.7 F (37.1 C)] 98.7 F (37.1 C) (02/23 0800) Pulse Rate:  [63-99] 99 (02/23 1100) Resp:  [15-31] 31 (02/23 1100) BP: (93-126)/(63-89) 102/65  (02/23 1100) SpO2:  [94 %-100 %] 94 % (02/23 1100) Arterial Line BP: (101-133)/(54-79) 118/67 (02/23 1100) FiO2 (%):  [40 %] 40 % (02/23 0400)  General - Well nourished, well developed, sleepy and lethargic.  Ophthalmologic - fundi not visualized due to noncooperation.  Cardiovascular - Regular rhythm and rate.  Neuro - eyes closed and not open on voice, however, able to follow simple commands on the right hand and foot with psychomotor slowing. Did not have language output on my exam. With forced eye opening, eyes in the mid  position, no tracking, not blinking to visual threat, PERRL. No significant nasolabial fold flattening. Tongue protrusion not cooperative. Able to move fingers on the right and slightly move toes on command, but no spontaneous movement, but withdraw to pain strongly on the RUE and RLE. LUE and LLE slight withdraw to pain stimulation. Sensation, coordination and gait not tested.   ASSESSMENT/PLAN Mr. DONLD LUEBBE is a 26 y.o. male with history of CHF, cardiogenic shock, DVT on Eliquis, cocaine use presenting after going to sleep at 2200 on 2/20.  Family states that at 4 AM they heard him fall and got back into bed, they saw him again at 8 AM and he was slurring words, had left-sided weakness, difficulty walking and was acting "strange".  Initial NIH SS 24.  Decreased level consciousness, disoriented, not following commands, right gaze preference, left hemianopsia, left facial droop, left arm weakness, left leg weakness, decreased sensation on the left, global aphasia, sensory neglect.  Not a candidate for TNK due to Eliquis, taken to IR emergently.  Sedation adjusted today in hopes of extubation.  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 Small vessel disease. Atrophy. ASPECTS 10.    CTA head & neck M1 MCAs patent with  suspected right superior M2 MCA occlusion or severe stenosis in the region of right anterior MCA territory penumbra CT perfusion penumbra in the right anterior MCA territory and the left posterior MCA territory. Overall penumbra is estimated to be 17 mL. No core infarct identified Post IR CT No ICH MRI large area of restricted diffusion in the right MCA territory, moderate to large acute infarct in the left temporal lobe  2D Echo 09/02/2021-EF less than 20%, LV global hypokinesis, severe tricuspid regurgitation LDL 45 HgbA1c 4.5 VTE prophylaxis -Lovenox Eliquis (apixaban) daily prior to admission, now on aspirin 325 mg daily.  May consider resume AC in 5 to 7 days to avoid risk of HT Therapy recommendations:  pending Disposition: Pending  Congestive heart failure with reduced EF Home medications: Digoxin, Jardiance, Entresto, Lasix, spironolactone Last echo 2/3-EF less than 20% Recently admitted for heart failure from 2/2 - 2/8 under Dr. 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 1/7-08/08/2021- admitted to [facility] with LE swelling and dyspnea- RLE DVT and b/l pulm emboli Was discharged on Eliquis Wife is unsure if he  was taking his Eliquis appropriately, he did report noticing blood in his stool recently and had discussed stopping Eliquis during last hospitalization  Respiratory failure, hypoxic Intubated for procedure and airway protection Remain intubated after procedure due to mental status Extubated on 2/23 CCM on board  Hypotension BP goal normotensive Off Norepinephrine, improving  Cocaine abuse UDS positive for cocaine Cessation education will be provided  Tobacco abuse Current smoker Smoking cessation counseling will be provided  Other Stroke Risk Factors Advanced Age >/= 65  ETOH use, alcohol level <10, advised to drink no more than 2 drink(s) a day THC abuse - UDS:  THC POSITIVE. Patient will be advised to stop using due to stroke  risk.  Other Active Problems   Hospital day # 2   Marvel Plan, MD PhD Stroke Neurology 09/22/2021 12:01 PM  This patient is critically ill due to large bilateral infarcts, cocaine use, CHF with low EF, DVT/PE on eliquis, respiratory failure and at significant risk of neurological worsening, death form recurrent stroke, hemorrhagic conversion, seizure, heart failure, and renal failure. This patient's care requires constant monitoring of vital signs, hemodynamics, respiratory and cardiac monitoring, review of multiple databases, neurological assessment, discussion with family, other specialists and medical decision making of high complexity. I spent 40 minutes of neurocritical care time in the care of this patient. I had long discussion with wife at bedside, updated pt current condition, treatment plan and potential prognosis, and answered all the questions.  She expressed understanding and appreciation.    To contact Stroke Continuity provider, please refer to [email or web address]. After hours, contact General Neurology

## 2021-09-22 NOTE — Evaluation (Signed)
Clinical/Bedside Swallow Evaluation Patient Details  Name: Carlos Mckinney MRN: [place]:[id number] Date of Birth: 1996/07/01  Today's Date: 09/22/2021 Time: SLP Start Time (ACUTE ONLY): 32 SLP Stop Time (ACUTE ONLY): 1546 SLP Time Calculation (min) (ACUTE ONLY): 18 min  Past Medical History:  Past Medical History:  Diagnosis Date   Heart failure ([place])    Hypertension    Polysubstance abuse ([place])    Past Surgical History:  Past Surgical History:  Procedure Laterality Date   IR CT HEAD LTD  09/22/2021   IR PERCUTANEOUS ART THROMBECTOMY/INFUSION INTRACRANIAL INC DIAG ANGIO  09/20/2021   RADIOLOGY WITH ANESTHESIA N/A 09/20/2021   Procedure: IR WITH ANESTHESIA;  Surgeon: Radiologist, Medication, MD;  Location: [facility];  Service: Radiology;  Laterality: N/A;   HPI:  Pt is a 26 y/o male who presented to the ED with  left sided weakness, aphasia, and lethargy. Pt intubated for airway protection. ETT 2/21-2/23 [phone]). Pt failed [facility] due to coughing. 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 Pt s/p revascularization of R MCA 2/21. MRI brain 2/22: acute infarcts in the right MCA territory, in both the right frontal and temporal lobes, moderate to large acute infarct in the left temporal lobe in the left MCA territory. PMH: advanced CHF, tobacco abuse, cocaine abuse, obesity, DVT.    Assessment / Plan / Recommendation  Clinical Impression  Pt was seen for bedside swallow evaluation. Oral mechanism exam was limited due to pt's difficulty following commands, or his unwillingness to do so. He presented with adequate, natural dentition. Pt was appeared suspicious of what the SLP was providing, but was amenable to accept food from packages which were opened in front of him, and then subsequently accepted liquids. Pt demonstrated symptoms of oral phase dysphagia characterized by reduced bolus awareness, oral holding, and prolonged/inconsistent  mastication. No s/sx of aspiration were noted with solids or liquids. A dysphagia 2 diet with thin liquids is recommended at this time. SLP will follow to ensure tolerance and for advancement as clinically indicated. SLP Visit Diagnosis: Dysphagia, unspecified (R13.10)    Aspiration Risk  Mild aspiration risk    Diet Recommendation Dysphagia 2 (Fine chop);Thin liquid   Liquid Administration via: Straw;Cup Medication Administration: Crushed with puree (which is how he takes them at baseline per RN) Supervision: Staff to assist with self feeding Compensations: Slow rate;Small sips/bites Postural Changes: Seated upright at 90 degrees    Other  Recommendations Oral Care Recommendations: Oral care BID    Recommendations for follow up therapy are one component of a multi-disciplinary discharge planning process, led by the attending physician.  Recommendations may be updated based on patient status, additional functional criteria and insurance authorization.  Follow up Recommendations Acute inpatient rehab (3hours/day)      Assistance Recommended at Discharge    Functional Status Assessment Patient has had a recent decline in their functional status and demonstrates the ability to make significant improvements in function in a reasonable and predictable amount of time.  Frequency and Duration min 2x/week  2 weeks       Prognosis Prognosis for Safe Diet Advancement: Good Barriers to Reach Goals: Language deficits;Cognitive deficits      Swallow Study   General Date of Onset: 09/21/21 HPI: Pt is a 26 y/o male who presented to the ED with  left sided weakness, aphasia, and lethargy. Pt intubated for airway protection. ETT 2/21-2/23 [phone]). Pt failed [facility] due to coughing. CTA 2/21:  right superior M2 MCA occlusion or severe stenosis in the region of right anterior MCA territory. Pt s/p revascularization of R MCA 2/21. MRI brain 2/22: acute infarcts in the right MCA territory, in both the  right frontal and temporal lobes, moderate to large acute infarct in the left temporal lobe in the left MCA territory. PMH: advanced CHF, tobacco abuse, cocaine abuse, obesity, DVT. Type of Study: Bedside Swallow Evaluation Previous Swallow Assessment: none Diet Prior to this Study: NPO Temperature Spikes Noted: No Respiratory Status: Nasal cannula History of Recent Intubation: Yes Length of Intubations (days): 2 days Date extubated: 09/22/21 Behavior/Cognition: Alert;Cooperative;Requires cueing Oral Cavity Assessment: Within Functional Limits Oral Care Completed by SLP: No Oral Cavity - Dentition: Adequate natural dentition Vision: Functional for self-feeding Self-Feeding Abilities: Able to feed self Patient Positioning: Upright in bed;Postural control adequate for testing Baseline Vocal Quality: Low vocal intensity Volitional Cough: Cognitively unable to elicit Volitional Swallow: Able to elicit    Oral/Motor/Sensory Function Overall Oral Motor/Sensory Function:  (difficult to assess)   Ice Chips Ice chips: Not tested   Thin Liquid Thin Liquid: Within functional limits Presentation: Straw    Nectar Thick Nectar Thick Liquid: Not tested   Honey Thick Honey Thick Liquid: Not tested   Puree Puree: Impaired Presentation: Spoon Oral Phase Impairments: Poor awareness of bolus Oral Phase Functional Implications: Oral holding   Solid     Solid: Impaired Oral Phase Impairments: Impaired mastication Oral Phase Functional Implications: Impaired mastication     Carlos Mckinney. Carlos Mckinney, [place], [place] Office number [phone] Pager [phone]  Horton Marshall 09/22/2021,4:01 PM

## 2021-09-23 LAB — CBC WITH DIFFERENTIAL/PLATELET
Abs Immature Granulocytes: 0 10*3/uL (ref 0.00–0.07)
Basophils Absolute: 0 10*3/uL (ref 0.0–0.1)
Basophils Relative: 0 %
Eosinophils Absolute: 0.1 10*3/uL (ref 0.0–0.5)
Eosinophils Relative: 2 %
HCT: 36.3 % — ABNORMAL LOW (ref 39.0–52.0)
Hemoglobin: 11.3 g/dL — ABNORMAL LOW (ref 13.0–17.0)
Lymphocytes Relative: 12 %
Lymphs Abs: 0.8 10*3/uL (ref 0.7–4.0)
MCH: 28.1 pg (ref 26.0–34.0)
MCHC: 31.1 g/dL (ref 30.0–36.0)
MCV: 90.3 fL (ref 80.0–100.0)
Monocytes Absolute: 0.6 10*3/uL (ref 0.1–1.0)
Monocytes Relative: 8 %
Neutro Abs: 5.5 10*3/uL (ref 1.7–7.7)
Neutrophils Relative %: 78 %
Platelets: 309 10*3/uL (ref 150–400)
RBC: 4.02 MIL/uL — ABNORMAL LOW (ref 4.22–5.81)
RDW: 23.3 % — ABNORMAL HIGH (ref 11.5–15.5)
WBC: 7 10*3/uL (ref 4.0–10.5)
nRBC: 0 % (ref 0.0–0.2)
nRBC: 1 /100 WBC — ABNORMAL HIGH

## 2021-09-23 LAB — GLUCOSE, CAPILLARY
Glucose-Capillary: 71 mg/dL (ref 70–99)
Glucose-Capillary: 75 mg/dL (ref 70–99)

## 2021-09-23 LAB — BASIC METABOLIC PANEL
Anion gap: 14 (ref 5–15)
BUN: <5 mg/dL — ABNORMAL LOW (ref 6–20)
CO2: 19 mmol/L — ABNORMAL LOW (ref 22–32)
Calcium: 8.9 mg/dL (ref 8.9–10.3)
Chloride: 106 mmol/L (ref 98–111)
Creatinine, Ser: 1.1 mg/dL (ref 0.61–1.24)
GFR, Estimated: >60 mL/min (ref >60)
Glucose, Bld: 71 mg/dL (ref 70–99)
Potassium: 3.6 mmol/L (ref 3.5–5.1)
Sodium: 139 mmol/L (ref 135–145)

## 2021-09-23 NOTE — Progress Notes (Signed)
Speech Language Pathology Treatment: Dysphagia  Patient Details Name: Carlos Mckinney MRN: [place]:[id number] DOB: May 05, 1996 Today's Date: 09/23/2021 Time: [id number] SLP Time Calculation (min) (ACUTE ONLY): 21 min  Assessment / Plan / Recommendation Clinical Impression  Pt was seen for dysphagia treatment with his significant other, Moesha, present. Pt was hesitant to accepting solids from SLP, but he did so from [facility]. Pt's oral phase was improved compared to yesterday with less prolonged mastication and improved oral awareness. Pt exhibited coughing twice during the session, but coughing was also noted at baseline and the potential for this being unrelated is considered. No s/sx of aspiration were noted with thin liquids via straw despite suboptimal positioning and his use of consecutive swallows. A dysphagia 3 diet with thin liquids is recommended at this time. SLP will continue to follow pt.     HPI HPI: Pt is a 26 y/o male who presented to the ED with  left sided weakness, aphasia, and lethargy. Pt intubated for airway protection. ETT 2/21-2/23 [phone]). Pt failed [facility] due to coughing. 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 Pt s/p revascularization of R MCA 2/21. MRI brain 2/22: acute infarcts in the right MCA territory, in both the right frontal and temporal lobes, moderate to large acute infarct in the left temporal lobe in the left MCA territory. PMH: advanced CHF, tobacco abuse, cocaine abuse, obesity, DVT.      SLP Plan  Continue with current plan of care      Recommendations for follow up therapy are one component of a multi-disciplinary discharge planning process, led by the attending physician.  Recommendations may be updated based on patient status, additional functional criteria and insurance authorization.    Recommendations  Diet recommendations: Dysphagia 3 (mechanical soft);Thin liquid Liquids provided via:  Cup;Straw Medication Administration: Crushed with puree (which is how he takes them at baseline per RN) Supervision: Staff to assist with self feeding Compensations: Slow rate;Small sips/bites Postural Changes and/or Swallow Maneuvers: Seated upright 90 degrees                Oral Care Recommendations: Oral care BID Follow Up Recommendations: Acute inpatient rehab (3hours/day) Assistance recommended at discharge: Frequent or constant Supervision/Assistance SLP Visit Diagnosis: Dysphagia, unspecified (R13.10) Plan: Continue with current plan of care          Demari Kropp I. Hardin Negus, [place], [place] Office number [phone] Pager [place]  09/23/2021, 12:36 PM

## 2021-09-23 NOTE — Evaluation (Signed)
Speech Language Pathology Evaluation Patient Details Name: Carlos Mckinney MRN: [place]:[id number] DOB: Jul 03, 1996 Today's Date: 09/23/2021 Time: 1131-1208 SLP Time Calculation (min) (ACUTE ONLY): 37 min  Problem List:  Patient Active Problem List   Diagnosis Date Noted   Acute ischemic stroke ([place]) 09/20/2021   Middle cerebral artery embolism, right 09/20/2021   CVA (cerebral vascular accident) ([place]) 09/20/2021   Acute on chronic diastolic congestive heart failure, NYHA class 3 ([place]) 09/01/2021   Iron deficiency anemia 08/07/2021   Multiple pulmonary emboli ([place]) 08/06/2021   Acute on chronic HFrEF (heart failure with reduced ejection fraction) ([place]) 08/06/2021   Acute deep vein thrombosis (DVT) of right lower extremity ([place]) 08/06/2021   Anemia 08/06/2021   Past Medical History:  Past Medical History:  Diagnosis Date   Heart failure ([place])    Hypertension    Polysubstance abuse ([place])    Past Surgical History:  Past Surgical History:  Procedure Laterality Date   IR CT HEAD LTD  09/22/2021   IR PERCUTANEOUS ART THROMBECTOMY/INFUSION INTRACRANIAL INC DIAG ANGIO  09/20/2021   RADIOLOGY WITH ANESTHESIA N/A 09/20/2021   Procedure: IR WITH ANESTHESIA;  Surgeon: Radiologist, Medication, MD;  Location: [facility];  Service: Radiology;  Laterality: N/A;   HPI:  Pt is a 26 y/o male who presented to the ED with  left sided weakness, aphasia, and lethargy. Pt intubated for airway protection. ETT 2/21-2/23 [phone]). Pt failed [facility] due to coughing. 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 Pt s/p revascularization of R MCA 2/21. MRI brain 2/22: acute infarcts in the right MCA territory, in both the right frontal and temporal lobes, moderate to large acute infarct in the left temporal lobe in the left MCA territory. PMH: advanced CHF, tobacco abuse, cocaine abuse, obesity, DVT.   Assessment / Plan / Recommendation Clinical Impression  Pt was seen  for speech-language-cognition evaluation with his significant other and father present for the majority of the evaluation. Pt's participation was reduced due to pt's cooperation. He expressed to his family, "They think I'm crazy, I ain't gon' do this stuff 'cause they think I'm cracy."; he refused to complete any formal tasks. Language and cognition therefore could not be assessed with any formal measures. Informal assessment revealed impairments in attention, awareness, memory, and orientation. Pt's verbal output was linguistically fluent without evidence of word retrieval difficulty. He required multiple repetitions, tactile cues, orthographic cues, and/or visual cues to accurately follow commands and respond to some questions and to participate in conversation. With this level of support, pt was able to briefly participate in conversation; however, despite his intermittent demonstration of understanding, he exhibited difficulty with retention of the information that was provided. SLP suspects that pt's auditory comprehension is at least partly impacted by his cognition. Reading comprehension was adequate for sentence-level material and written sentences were morphosyntactically accurate. 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 Additional assessment is warranted to further elucidate the nature and severity of pt's impairments, and for treatment.    SLP Assessment  SLP Recommendation/Assessment: Patient needs continued Speech Lanaguage Pathology Services SLP Visit Diagnosis: Aphasia (R47.01);Cognitive communication deficit (R41.841)    Recommendations for follow up therapy are one component of a multi-disciplinary discharge planning process, led by the attending physician.  Recommendations may be updated based on patient status, additional functional criteria and insurance authorization.    Follow Up Recommendations  Acute  inpatient rehab (  3hours/day)    Assistance Recommended at Discharge  Frequent or constant Supervision/Assistance  Functional Status Assessment Patient has had a recent decline in their functional status and demonstrates the ability to make significant improvements in function in a reasonable and predictable amount of time.  Frequency and Duration min 2x/week  2 weeks      SLP Evaluation Cognition  Overall Cognitive Status: Impaired/Different from baseline Arousal/Alertness: Awake/alert Orientation Level: Oriented to person;Oriented to place;Disoriented to situation;Disoriented to time Attention: Focused;Sustained Focused Attention: Impaired Focused Attention Impairment: Verbal basic Sustained Attention: Impaired Sustained Attention Impairment: Verbal basic Memory: Impaired Memory Impairment: Storage deficit;Decreased short term memory;Decreased recall of new information Decreased Short Term Memory: Verbal basic Awareness: Impaired Awareness Impairment: Intellectual impairment Problem Solving: Impaired Behaviors: Restless;Verbal agitation       Comprehension  Auditory Comprehension Overall Auditory Comprehension: Impaired Commands: Impaired One Step Basic Commands:  (2/7) Two Step Basic Commands:  (0) Conversation: Simple    Expression Verbal Expression Level of Generative/Spontaneous Verbalization: Conversation Repetition: No impairment Confrontation:  (2/2) Verbal Errors: Perseveration Pragmatics: Impairment Impairments: Topic appropriateness;Topic maintenance;Turn Taking Written Expression Dominant Hand: Right   Oral / Motor  Oral Motor/Sensory Function Overall Oral Motor/Sensory Function: Within functional limits Motor Speech Overall Motor Speech: Appears within functional limits for tasks assessed Respiration: Within functional limits Phonation: Normal Resonance: Within functional limits Articulation: Within functional limitis Intelligibility:  Intelligible Motor Speech Errors: Not applicable           Thimothy Barretta I. Hardin Negus, [place], [place] Office number [phone] Pager [place] 09/23/2021, 1:52 PM

## 2021-09-23 NOTE — H&P (Incomplete)
Physical Medicine and Rehabilitation Admission H&P    Chief Complaint  Patient presents with   Code Stroke  : HPI: Carlos Mckinney is a 26 year old right-handed male with history of systolic CHF and cardiogenic shock followed at the heart failure clinic followed by Dr. 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  Per chart review patient lives with spouse.  1 level home with ramp entrance.  Independent prior to admission.  Presented 09/20/2021 with acute onset of left-sided weakness and aphasia.  Cranial CT scan showed no evidence of acute intracranial abnormality.  CT angiogram head and neck showed right M2 occlusion and underwent revascularization per interventional radiology.  He did require short-term intubation.  Follow-up MRI showed large area of restricted diffusion right MCA territory, in both the right frontal and temporal lobes consistent with acute infarct.  Additional moderate to large acute infarct in left temporal lobe in the left MCA territory.  Neurology follow-up currently maintained on aspirin 325 mg daily considering resuming anticoagulation in 5 to 7 days to lower risk of hemorrhagic transformation.  He was cleared to begin Lovenox for DVT prophylaxis.  Currently on dysphagia #2 thin liquid diet.  Therapy evaluations completed due to patient's left-sided weakness and aphasia was admitted for a comprehensive rehab program.  Review of Systems  Constitutional:  Positive for malaise/fatigue. Negative for chills and fever.  HENT:  Negative for hearing loss.   Eyes:  Negative for blurred vision and double vision.  Respiratory:  Positive for shortness of breath. Negative for cough.   Cardiovascular:  Positive for leg swelling. Negative for chest pain and palpitations.   Gastrointestinal:  Positive for constipation. Negative for heartburn, nausea and vomiting.  Genitourinary:  Negative for dysuria, flank pain and hematuria.  Musculoskeletal:  Positive for joint pain and myalgias.  Skin:  Negative for rash.  Neurological:  Positive for sensory change and weakness.  All other systems reviewed and are negative. Past Medical History:  Diagnosis Date   Heart failure ([place])    Hypertension    Polysubstance abuse [facility])    Past Surgical History:  Procedure Laterality Date   IR CT HEAD LTD  09/22/2021   IR PERCUTANEOUS ART THROMBECTOMY/INFUSION INTRACRANIAL INC DIAG ANGIO  09/20/2021   RADIOLOGY WITH ANESTHESIA N/A 09/20/2021   Procedure: IR WITH ANESTHESIA;  Surgeon: Radiologist, Medication, MD;  Location: [place];  Service: Radiology;  Laterality: N/A;   Family History  Problem Relation Age of Onset   Hypertension Mother    Social History:  reports that he has quit smoking. His smoking use included cigarettes. He has never used smokeless tobacco. He reports that he does not drink alcohol and does not use drugs. Allergies: No Known Allergies Medications Prior to Admission  Medication Sig Dispense Refill   albuterol (VENTOLIN HFA) 108 (90 Base) MCG/ACT inhaler Inhale 1-2 puffs into the lungs every 6 (six) hours as needed for wheezing or shortness of breath.     digoxin (LANOXIN) 0.125 MG tablet Take 1 tablet (0.125 mg total) by mouth daily. 30 tablet 6   empagliflozin (JARDIANCE) 10 MG TABS tablet Take 1 tablet (10 mg total) by mouth daily. 30 tablet 6   furosemide (LASIX) 40 MG tablet Take 1 tablet (40 mg total) by mouth daily. 30 tablet 3   phenylephrine-shark liver oil-mineral oil-petrolatum (PREPARATION H) 0.25-14-74.9 %  rectal ointment Place 1 application rectally 2 (two) times daily as needed for hemorrhoids. 25 g 1   polyethylene glycol (MIRALAX / GLYCOLAX) 17 g packet Take 17 g by mouth daily. (Patient taking differently: Take 17 g by mouth daily as needed  (constipation).) 30 each 4   potassium chloride (KLOR-CON) 20 MEQ packet Use 1 packet (20 mEq) by mouth daily. 30 packet 6   sacubitril-valsartan (ENTRESTO) 49-51 MG Take 1 tablet by mouth 2 (two) times daily. 60 tablet 4   spironolactone (ALDACTONE) 25 MG tablet Take 1 tablet (25 mg total) by mouth daily. 30 tablet 6   apixaban (ELIQUIS) 5 MG TABS tablet Take 1 tablet (5 mg total) by mouth 2 (two) times daily. (Patient not taking: Reported on 09/21/2021) 60 tablet 6   hydrocortisone (ANUSOL-HC) 25 MG suppository Place 1 suppository (25 mg total) rectally 2 (two) times daily. (Patient not taking: Reported on 09/21/2021) 12 suppository 0      Home: Home Living Family/patient expects to be discharged to:: Private residence Living Arrangements: Spouse/significant other Available Help at Discharge: Family, Available 24 hours/day Type of Home: House Home Access: Ramped entrance Home Layout: One level Bathroom Shower/Tub: Chiropodist: Standard Home Equipment: Cane - single point   Functional History: Prior Function Prior Level of Function : Independent/Modified Independent  Functional Status:  Mobility: Bed Mobility Overal bed mobility: Needs Assistance Bed Mobility: Supine to Sit, Sit to Supine Supine to sit: Supervision, HOB elevated Sit to supine: +2 for physical assistance, Total assist General bed mobility comments: pt resistant to returning to supine Transfers Overall transfer level: Needs assistance Equipment used: 1 person hand held assist Transfers: Sit to/from Stand Sit to Stand: Min assist Ambulation/Gait Ambulation/Gait assistance: Min Web designer (Feet): 4 Feet (4' forward and backward twice) Assistive device: None Gait Pattern/deviations: Step-to pattern General Gait Details: pt with slowed step-to gait, increased lateral sway, multiple verbal cues to sequence transition from forward to backward Gait velocity: reduced Gait velocity  interpretation: <1.31 ft/sec, indicative of household ambulator    ADL:    Cognition: Cognition Overall Cognitive Status: Impaired/Different from baseline Orientation Level: Oriented to person, Oriented to place, Disoriented to time, Disoriented to situation Cognition Arousal/Alertness: Awake/alert Behavior During Therapy: Impulsive Overall Cognitive Status: Impaired/Different from baseline Area of Impairment: Orientation, Memory, Attention, Following commands, Safety/judgement, Awareness, Problem solving Orientation Level: Disoriented to, Place, Situation Current Attention Level: Focused Memory: Decreased recall of precautions, Decreased short-term memory Following Commands: Follows one step commands inconsistently Safety/Judgement: Decreased awareness of safety, Decreased awareness of deficits Awareness: Intellectual Problem Solving: Slow processing, Decreased initiation, Difficulty sequencing, Requires verbal cues, Requires tactile cues General Comments: pt perseverating on wanting to find his clothes, not easily redirectible  Physical Exam: Blood pressure (!) 92/53, pulse 88, temperature 98.5 F (36.9 C), temperature source Oral, resp. rate (!) 24, height 5\' 11"  (1.803 m), weight 93.9 kg, SpO2 100 %. Physical Exam Neurological:     Comments: Patient was lethargic.  He kept his eyes closed.  He did follow some simple commands.  Patient had no verbal output during exam with limited participation.    Results for orders placed or performed during the hospital encounter of 09/20/21 (from the past 48 hour(s))  Glucose, capillary     Status: Abnormal   Collection Time: 09/21/21 11:13 AM  Result Value Ref Range   Glucose-Capillary 127 (H) 70 - 99 mg/dL    Comment: Glucose reference range applies only to samples taken after fasting for at  least 8 hours.  Glucose, capillary     Status: Abnormal   Collection Time: 09/21/21  3:55 PM  Result Value Ref Range   Glucose-Capillary 102 (H)  70 - 99 mg/dL    Comment: Glucose reference range applies only to samples taken after fasting for at least 8 hours.  Glucose, capillary     Status: None   Collection Time: 09/21/21  7:40 PM  Result Value Ref Range   Glucose-Capillary 99 70 - 99 mg/dL    Comment: Glucose reference range applies only to samples taken after fasting for at least 8 hours.  Glucose, capillary     Status: Abnormal   Collection Time: 09/21/21 11:37 PM  Result Value Ref Range   Glucose-Capillary 102 (H) 70 - 99 mg/dL    Comment: Glucose reference range applies only to samples taken after fasting for at least 8 hours.  Glucose, capillary     Status: None   Collection Time: 09/22/21  3:21 AM  Result Value Ref Range   Glucose-Capillary 83 70 - 99 mg/dL    Comment: Glucose reference range applies only to samples taken after fasting for at least 8 hours.  Basic metabolic panel     Status: Abnormal   Collection Time: 09/22/21  5:48 AM  Result Value Ref Range   Sodium 136 135 - 145 mmol/L   Potassium 4.1 3.5 - 5.1 mmol/L   Chloride 105 98 - 111 mmol/L   CO2 21 (L) 22 - 32 mmol/L   Glucose, Bld 86 70 - 99 mg/dL    Comment: Glucose reference range applies only to samples taken after fasting for at least 8 hours.   BUN 5 (L) 6 - 20 mg/dL   Creatinine, Ser 1.07 0.61 - 1.24 mg/dL   Calcium 9.0 8.9 - 10.3 mg/dL   GFR, Estimated >60 >60 mL/min    Comment: (NOTE) Calculated using the CKD-EPI Creatinine Equation (2021)    Anion gap 10 5 - 15    Comment: Performed at [facility] [address], [place], [place] [address]  CBC with Differential/Platelet     Status: Abnormal   Collection Time: 09/22/21  5:48 AM  Result Value Ref Range   WBC 6.7 4.0 - 10.5 K/uL   RBC 3.74 (L) 4.22 - 5.81 MIL/uL   Hemoglobin 10.5 (L) 13.0 - 17.0 g/dL   HCT 34.0 (L) 39.0 - 52.0 %   MCV 90.9 80.0 - 100.0 fL   MCH 28.1 26.0 - 34.0 pg   MCHC 30.9 30.0 - 36.0 g/dL   RDW 24.0 (H) 11.5 - 15.5 %   Platelets 270 150 - 400 K/uL   nRBC  0.0 0.0 - 0.2 %   Neutrophils Relative % 75 %   Neutro Abs 5.0 1.7 - 7.7 K/uL   Lymphocytes Relative 14 %   Lymphs Abs 0.9 0.7 - 4.0 K/uL   Monocytes Relative 8 %   Monocytes Absolute 0.5 0.1 - 1.0 K/uL   Eosinophils Relative 2 %   Eosinophils Absolute 0.2 0.0 - 0.5 K/uL   Basophils Relative 1 %   Basophils Absolute 0.0 0.0 - 0.1 K/uL   WBC Morphology MORPHOLOGY UNREMARKABLE    Smear Review PLATELETS APPEAR ADEQUATE    Immature Granulocytes 0 %   Abs Immature Granulocytes 0.01 0.00 - 0.07 K/uL   Polychromasia PRESENT    Giant PLTs PRESENT     Comment: Performed at [facility], [place] [address] [place], [place] [address]  Glucose, capillary     Status: None   Collection Time: 09/22/21  8:24 AM  Result Value Ref Range   Glucose-Capillary 86 70 - 99 mg/dL    Comment: Glucose reference range applies only to samples taken after fasting for at least 8 hours.  Glucose, capillary     Status: None   Collection Time: 09/22/21 12:45 PM  Result Value Ref Range   Glucose-Capillary 81 70 - 99 mg/dL    Comment: Glucose reference range applies only to samples taken after fasting for at least 8 hours.  Glucose, capillary     Status: None   Collection Time: 09/22/21  7:36 PM  Result Value Ref Range   Glucose-Capillary 75 70 - 99 mg/dL    Comment: Glucose reference range applies only to samples taken after fasting for at least 8 hours.  Glucose, capillary     Status: None   Collection Time: 09/22/21 11:21 PM  Result Value Ref Range   Glucose-Capillary 78 70 - 99 mg/dL    Comment: Glucose reference range applies only to samples taken after fasting for at least 8 hours.  Glucose, capillary     Status: None   Collection Time: 09/23/21  3:16 AM  Result Value Ref Range   Glucose-Capillary 71 70 - 99 mg/dL    Comment: Glucose reference range applies only to samples taken after fasting for at least 8 hours.  Basic metabolic panel     Status: Abnormal   Collection Time: 09/23/21  7:05 AM   Result Value Ref Range   Sodium 139 135 - 145 mmol/L   Potassium 3.6 3.5 - 5.1 mmol/L   Chloride 106 98 - 111 mmol/L   CO2 19 (L) 22 - 32 mmol/L   Glucose, Bld 71 70 - 99 mg/dL    Comment: Glucose reference range applies only to samples taken after fasting for at least 8 hours.   BUN <5 (L) 6 - 20 mg/dL   Creatinine, Ser 1.10 0.61 - 1.24 mg/dL   Calcium 8.9 8.9 - 10.3 mg/dL   GFR, Estimated >60 >60 mL/min    Comment: (NOTE) Calculated using the CKD-EPI Creatinine Equation (2021)    Anion gap 14 5 - 15    Comment: Performed at [facility] [address] [place], [place] [address]  CBC with Differential/Platelet     Status: Abnormal   Collection Time: 09/23/21  7:05 AM  Result Value Ref Range   WBC 7.0 4.0 - 10.5 K/uL   RBC 4.02 (L) 4.22 - 5.81 MIL/uL   Hemoglobin 11.3 (L) 13.0 - 17.0 g/dL   HCT 36.3 (L) 39.0 - 52.0 %   MCV 90.3 80.0 - 100.0 fL   MCH 28.1 26.0 - 34.0 pg   MCHC 31.1 30.0 - 36.0 g/dL   RDW 23.3 (H) 11.5 - 15.5 %   Platelets 309 150 - 400 K/uL   nRBC 0.0 0.0 - 0.2 %   Neutrophils Relative % 78 %   Neutro Abs 5.5 1.7 - 7.7 K/uL   Lymphocytes Relative 12 %   Lymphs Abs 0.8 0.7 - 4.0 K/uL   Monocytes Relative 8 %   Monocytes Absolute 0.6 0.1 - 1.0 K/uL   Eosinophils Relative 2 %   Eosinophils Absolute 0.1 0.0 - 0.5 K/uL   Basophils Relative 0 %   Basophils Absolute 0.0 0.0 - 0.1 K/uL   nRBC 1 (H) 0 /100 WBC   Abs Immature Granulocytes 0.00 0.00 - 0.07 K/uL  Acanthocytes PRESENT    Polychromasia PRESENT    Target Cells PRESENT     Comment: Performed at [facility], [place] [address] [place], [place] [address]  Glucose, capillary     Status: None   Collection Time: 09/23/21  7:43 AM  Result Value Ref Range   Glucose-Capillary 75 70 - 99 mg/dL    Comment: Glucose reference range applies only to samples taken after fasting for at least 8 hours.   IR CT Head Ltd  Result Date: 09/22/2021 INDICATION: New onset right gaze deviation, left-sided  weakness and slurred speech. Occluded superior division of the right middle cerebral artery proximally on CT angiogram of the head and neck. EXAM: 1. EMERGENT LARGE VESSEL OCCLUSION THROMBOLYSIS (anterior CIRCULATION) COMPARISON:  CT angiogram of the head and neck of September 20, 2021. MEDICATIONS: Ancef 2 g IV antibiotic was administered within 1 hour of the procedure. ANESTHESIA/SEDATION: General anesthesia. CONTRAST:  Omnipaque 300 approximately 100 mL. FLUOROSCOPY TIME:  Fluoroscopy Time: 18 minutes 0 seconds (1901 mGy). COMPLICATIONS: None immediate. TECHNIQUE: Following a full explanation of the procedure along with the potential associated complications, an informed witnessed consent was obtained. The risks of intracranial hemorrhage of 10%, worsening neurological deficit, ventilator dependency, death and inability to revascularize were all reviewed in detail with the patient's mother. The patient was then put under general anesthesia by the Department of Anesthesiology at [facility]. The right groin was prepped and draped in the usual sterile fashion. Thereafter using modified Seldinger technique, transfemoral access into the right common femoral artery was obtained without difficulty. Over a 0.035 inch guidewire an 8 French 25 Pinnacle sheath was inserted. Through this, and also over a 0.035 inch guidewire a 5 [place] JB 1 catheter was advanced to the aortic arch region and selectively positioned in the innominate artery and the right common carotid artery. FINDINGS: The innominate arteriogram demonstrates the origin of the right subclavian artery and the right common carotid artery to be widely patent. The right common carotid arteriogram demonstrates the right external carotid artery and its major branches to be widely patent. The right internal carotid artery at the bulb to the cranial skull base is widely patent. The petrous, the cavernous and the supraclinoid segments demonstrated tortuosity  with wide patency. The right anterior cerebral artery opacifies into the capillary and venous phases. The right middle cerebral artery demonstrates angiographic occlusion of the superior division proximally. PROCEDURE: Diagnostic JB 1 catheter in the right common carotid artery was then exchanged over a 0.035 inch 300 cm Rosen exchange guidewire for an 087 95 cm balloon guide catheter which had been prepped in 50% contrast and 50% heparinized saline infusion. The balloon guide was advanced to the distal right internal carotid artery. The guidewire was removed. Good aspiration obtained from the hub of the balloon guide catheter. Gentle control arteriogram performed through this demonstrates mild spasm in the cervical right ICA which responded to 25 mcg of nitroglycerin intra-arterially. More distally, the middle cerebral artery and the anterior cerebral artery demonstrated no change. Over a 0.014 inch micro guidewire, an 021 Trakstar 160 cm microcatheter was then advanced inside of a 6 [place] 017 130 cm support catheter to the supraclinoid right ICA. The micro guidewire was then gently manipulated without difficulty with a torque device and advanced through the occluded superior division into the M2 M3 region followed by the microcatheter. An 071 aspiration catheter was advanced to the distal right M1 segment. A 3 mm x 40 mm Solitaire  X retrieval device was then advanced to the distal end of the microcatheter. The O ring on the delivery microcatheter was loosened. With slight forward gentle traction with the right hand on the delivery micro guidewire with the left hand the delivery microcatheter was retrieved deploying the retrieval device. With proximal flow arrest in the right internal carotid artery, and constant aspiration applied at the hub of the 071 aspiration catheter with a Penumbra pump, and a 20 mL syringe at the hub of the balloon guide catheter for approximately 3 minutes, the retrieval device, the  microcatheter, and the 071 aspiration catheter were retrieved and removed. Control arteriogram performed through the balloon guide catheter in the right internal carotid artery demonstrated moderate spasm in the cervical right ICA. However, flow was noted into the petrous, the cavernous and the supraclinoid segments with now patency noted of the superior division. Significant spasm was appreciated in the right middle cerebral artery distally, extending into the proximal superior division. The balloon guide was retrieved more proximally into the right common carotid artery. Aliquots of 25 mcg of intra-arterial nitroglycerin was then given x5 with progressive improved caliber of the right internal carotid artery distally, and also the supraclinoid right ICA and revascularized superior division. A TICI 2C revascularization was achieved. The right anterior cerebral artery remained widely patent. The balloon guide was retrieved into the right common carotid artery. A control arteriogram performed through this demonstrated excellent flow through the extracranial and intracranial internal carotid artery. Wide patency was now noted of the right middle cerebral M1 segment, and the right recanalized superior division again maintaining a TICI 2C revascularization. Right anterior cerebral artery remained unchanged widely patent. The balloon guide was retrieved and removed. The 8 French Pinnacle sheath was removed with successful hemostasis at the right groin puncture site with an 8 French Angio-Seal closure device. Distal pulses remained palpable in both feet unchanged at the end of the procedure. A flat panel CT of the brain demonstrated no evidence of intracranial hemorrhage, or mass effect or of ventriculomegaly. Patient was left intubated in view of patient's medical condition, and the patient not being able to communicate at the beginning of the procedure. IMPRESSION: Status post endovascular complete revascularization of  occluded right middle cerebral artery superior division with 1 pass with a 3 mm x 40 mm Solitaire X retrieval device, and contact aspiration achieving a TICI 2C revascularization. PLAN: As per referring MD. Electronically Signed   By: Luanne Bras M.D.   On: 09/22/2021 10:55      Blood pressure (!) 92/53, pulse 88, temperature 98.5 F (36.9 C), temperature source Oral, resp. rate (!) 24, height 5\' 11"  (1.803 m), weight 93.9 kg, SpO2 100 %.  Medical Problem List and Plan: 1. 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  Antithrombotics: -DVT/anticoagulation:  Pharmaceutical: Lovenox  -antiplatelet therapy: Currently on aspirin 325 mg daily CONSIDERING RESUMiNG ANTICOAGULATION 5 to 7 days to lower risk of hemorrhagic transformation. 3. Pain Management: Tylenol as needed 4. Mood: Provide emotional support  -antipsychotic agents: N/A 5. Neuropsych: This patient is not capable of making decisions on his own behalf. 6. Skin/Wound Care: Routine skin checks 7. Fluids/Electrolytes/Nutrition: Routine INO's with follow-up chemistries 8.  Polysubstance abuse.  Urine drug screen positive cocaine as well as marijuana.  Provide counseling 9.  Systolic congestive heart failure.  Followed at the heart failure clinic per  Dr. Haroldine Laws.  Monitor for any signs of fluid overload.  Continue Entresto 49-51 mg twice daily as well as Jardiance 10 mg daily, Lanoxin 0.125 mg daily, Aldactone 25 mg daily 10.  Morbid obesity.  BMI 28.87.  Dietary follow-up 11.  History of DVT PE.  Eliquis currently on hold.  Awaiting plan to resume anticoagulation per neurology services    Cathlyn Parsons, PA-C 09/23/2021

## 2021-09-23 NOTE — Progress Notes (Addendum)
Patient transferred to 3W01 from 4N ICU.  Patient agitated and noncompliant upon arrival.   Patient refusing cardiac monitor and uncooperative.  Family attempting to help patient at bedside and to reiterate need for cardiac monitor.  RN able to place monitor but patient removed box shortly after placement.  Per spouse, patient needs "time and space" when he "gets like this".  Will attempt to reconnect cardiac monitor and will notify oncoming RN of high potential for noncompliance during the night.

## 2021-09-23 NOTE — Evaluation (Signed)
Occupational Therapy Evaluation Patient Details Name: Carlos Mckinney MRN: [id number] DOB: Jun 24, 1996 Today's Date: 09/23/2021   History of Present Illness Pt is 26 yo male with R gaze preference, LUE and LE weakness, global aphasia, and agitation. Pt taken to IR with revascularization of R MCA.   PMH: advanced CHF, tobacco abuse, cocaine abuse, obesity, DVT.   Clinical Impression   Patient admitted for the diagnosis above.  PTA he lives with his SO and needed no assist for any aspect of ADL/IADL or mobility.  SO states he was somewhat sedentary at home, choosing to lie around a lot.  Deficits are listed below, with receptive component of aphasia being the most relevant.  OT will continue in the acute setting, and AIR is being recommended for post acute rehab.  Currently he is needing setup, supervision and up to [facility] for mobility and ADL completion.  Min A was provided for upper body ADL due to lines and leads.       Recommendations for follow up therapy are one component of a multi-disciplinary discharge planning process, led by the attending physician.  Recommendations may be updated based on patient status, additional functional criteria and insurance authorization.   Follow Up Recommendations  Acute inpatient rehab (3hours/day)    Assistance Recommended at Discharge Frequent or constant Supervision/Assistance  Patient can return home with the following      Functional Status Assessment  Patient has had a recent decline in their functional status and demonstrates the ability to make significant improvements in function in a reasonable and predictable amount of time.  Equipment Recommendations  None recommended by OT    Recommendations for Other Services       Precautions / Restrictions Precautions Precautions: Fall Restrictions Weight Bearing Restrictions: No      Mobility Bed Mobility Overal bed mobility: Needs Assistance Bed Mobility: Supine to Sit     Supine to  sit: Supervision          Transfers Overall transfer level: Needs assistance   Transfers: Sit to/from Stand, Bed to chair/wheelchair/BSC Sit to Stand: Supervision     Step pivot transfers: Min guard            Balance   Sitting-balance support: Feet supported Sitting balance-Leahy Scale: Good     Standing balance support: No upper extremity supported, During functional activity Standing balance-Leahy Scale: Fair                             ADL either performed or assessed with clinical judgement   ADL Overall ADL's : Needs assistance/impaired Eating/Feeding: Set up;Sitting   Grooming: Wash/dry hands;Wash/dry face;Min guard;Standing           Upper Body Dressing : Minimal assistance;Sitting   Lower Body Dressing: Min guard;Sit to/from stand   Toilet Transfer: Min guard;Ambulation;Regular Toilet   Toileting- Water quality scientist and Hygiene: Supervision/safety;Sit to/from stand               Vision   Vision Assessment?: No apparent visual deficits     Perception Perception Perception: Within Functional Limits   Praxis Praxis Praxis: Not tested    Pertinent Vitals/Pain Pain Assessment Pain Assessment: Faces Faces Pain Scale: No hurt Pain Intervention(s): Monitored during session     Hand Dominance Right   Extremity/Trunk Assessment Upper Extremity Assessment Upper Extremity Assessment: LUE deficits/detail LUE Deficits / Details: Patient moving extremity against gravity, was able to place socks.  Cervical / Trunk Assessment Cervical / Trunk Assessment: Normal   Communication Communication Communication: Expressive difficulties;Receptive difficulties   Cognition Arousal/Alertness: Awake/alert Behavior During Therapy: Impulsive Overall Cognitive Status: Impaired/Different from baseline                     Current Attention Level: Sustained   Following Commands: Follows one step commands  inconsistently Safety/Judgement: Decreased awareness of safety, Decreased awareness of deficits Awareness: Intellectual Problem Solving: Slow processing, Decreased initiation, Difficulty sequencing, Requires verbal cues, Requires tactile cues General Comments: receptive component complicates true cognitive assessment.     General Comments       Exercises     Shoulder Instructions      Home Living Family/patient expects to be discharged to:: Private residence Living Arrangements: Spouse/significant other Available Help at Discharge: Family;Available 24 hours/day Type of Home: House Home Access: Ramped entrance     Home Layout: One level     Bathroom Shower/Tub: Teacher, early years/pre: Standard     Home Equipment: Radio producer - single point      Lives With: Family    Prior Functioning/Environment Prior Level of Function : Independent/Modified Independent                        OT Problem List: Decreased activity tolerance;Cardiopulmonary status limiting activity;Obesity;Decreased knowledge of precautions;Impaired balance (sitting and/or standing);Decreased knowledge of use of DME or AE;Decreased safety awareness;Decreased cognition      OT Treatment/Interventions: Self-care/ADL training;Energy conservation;DME and/or AE instruction;Therapeutic activities;Patient/family education    OT Goals(Current goals can be found in the care plan section) Acute Rehab OT Goals OT Goal Formulation: With patient Time For Goal Achievement: 10/07/21 Potential to Achieve Goals: Good ADL Goals Pt Will Perform Grooming: Independently;sitting;standing Pt Will Perform Lower Body Bathing: Independently;sit to/from stand Pt Will Perform Lower Body Dressing: Independently;sit to/from stand Pt Will Transfer to Toilet: Independently;ambulating;regular height toilet  OT Frequency: Min 2X/week    Co-evaluation              AM-PAC OT "6 Clicks" Daily Activity     Outcome  Measure Help from another person eating meals?: A Little Help from another person taking care of personal grooming?: A Little Help from another person toileting, which includes using toliet, bedpan, or urinal?: A Little Help from another person bathing (including washing, rinsing, drying)?: A Little Help from another person to put on and taking off regular upper body clothing?: A Little Help from another person to put on and taking off regular lower body clothing?: A Little 6 Click Score: 18   End of Session Equipment Utilized During Treatment: Gait belt  Activity Tolerance: Patient tolerated treatment well Patient left: in chair;with call bell/phone within reach;with family/visitor present  OT Visit Diagnosis: Unsteadiness on feet (R26.81);Other symptoms and signs involving cognitive function;Hemiplegia and hemiparesis;Cognitive communication deficit (R41.841) Symptoms and signs involving cognitive functions: Nontraumatic intracerebral hemorrhage Hemiplegia - Right/Left: Left Hemiplegia - dominant/non-dominant: Non-Dominant Hemiplegia - caused by: Nontraumatic intracerebral hemorrhage                Time: 1205-1230 OT Time Calculation (min): 25 min Charges:  OT General Charges $OT Visit: 1 Visit OT Evaluation $OT Eval Moderate Complexity: 1 Mod OT Treatments $Self Care/Home Management : 8-22 mins  09/23/2021  RP, OTR/L  Acute Rehabilitation Services  Office:  [phone]   Metta Clines 09/23/2021, 1:53 PM

## 2021-09-23 NOTE — Progress Notes (Signed)
Inpatient Rehab Admissions Coordinator:   Spoke to pt's spouse of phone to discuss CIR goals/expectations of stay.  Discussed average length of stay about 2 weeks, with goals of supervision.  She verbalized understanding and confirms she can be with pt 24/7 at discharge, if needed.  Reviewed cost of care and she is agreeable.   I have a bed for this pt to admit to CIR on Saturday (2/25).  Dr. Erlinda Hong in agreement.  Rehab MD (Dr. Posey Pronto) to assess pt and confirm admission on Saturday.  Floor RN can call CIR at [phone] for report after 12pm on Saturday.  I have let pt/family and case manager know.    Shann Medal, PT, DPT Admissions Coordinator [phone] 09/23/21  1:07 PM

## 2021-09-23 NOTE — PMR Pre-admission (Signed)
PMR Admission Coordinator Pre-Admission Assessment  Patient: Carlos Mckinney is an 26 y.o., male MRN: [id number] DOB: May 18, 1996 Height: _0  (180.3 cm) Weight: 93.9 kg  Insurance Information HMO:     PPO:      PCP:      IPA:      80/20:      OTHER:  PRIMARY: Uninsured. Reviewed cost with spouse on 4/09      Policy#:       Subscriber:  CM Name:       Phone#:      Fax#:  Pre-Cert#:       Employer:  Benefits:  Phone #:      Name:  Eff. Date:      Deduct:       Out of Pocket Max:       Life Max:  CIR:       SNF:  Outpatient:      Co-Pay:  Home Health:       Co-Pay:  DME:      Co-Pay:  Providers:  SECONDARY:       Policy#:      Phone#:   Development worker, community:       Phone#:   The Actuary for patients in Inpatient Rehabilitation Facilities with attached Privacy Act [place] Records was provided and verbally reviewed with: N/A  Emergency Contact Information Contact Information     Name Relation Home Work Mobile   [place] Mother [phone]     Royal,Ernestine Cory Roughen [phone]  [phone]   Melene Plan   [phone]       Current Medical History  Patient Admitting Diagnosis:  CVA  History of Present Illness: Carlos Mckinney is a 26 year old right-handed male with history of systolic CHF and cardiogenic shock, followed at the heart failure clinic followed by Dr. 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  Presented 09/20/2021 with acute onset of left-sided weakness and aphasia.  Cranial CT scan showed no evidence of acute intracranial abnormality.  CT angiogram head and neck showed right M2 occlusion and underwent revascularization per interventional radiology.  He did require short-term intubation.   Follow-up MRI showed large area of restricted diffusion right MCA territory, in both the right frontal and temporal lobes consistent with acute infarct.  Additional moderate to large acute infarct in left temporal lobe in the left MCA territory.  Neurology follow-up currently maintained on aspirin 325 mg daily considering resuming anticoagulation in 5 to 7 days to lower risk of hemorrhagic transformation.  He was cleared to begin Lovenox for DVT prophylaxis.  Currently on dysphagia #2 thin liquid diet.  Therapy evaluations completed due to patient's left-sided weakness and aphasia was recommended for a comprehensive rehab program.  Complete NIHSS TOTAL: 19  [facility]'s medical record from [facility] has been reviewed by the rehabilitation admission coordinator and physician.  Past Medical History  Past Medical History:  Diagnosis Date   Heart failure ([place])    Hypertension    Polysubstance abuse ([place])     Has the patient had major surgery during 100 days prior to admission? No  Family History   family history includes Hypertension in his mother.  Current Medications  Current Facility-Administered Medications:    0.9 %  sodium chloride infusion, , Intravenous, Continuous, Toberman, Roney Mans, NP   0.9 %  sodium chloride infusion, 250 mL, Intravenous, Continuous, Rollene Rotunda, John D, PA-C   acetaminophen (TYLENOL) tablet 650 mg, 650 mg, Oral, Q4H PRN **OR** acetaminophen (TYLENOL) 160 MG/5ML solution 650 mg, 650 mg, Per Tube, Q4H PRN **OR** acetaminophen (TYLENOL) suppository 650 mg, 650 mg, Rectal, Q4H PRN, Ebbie Latus, Stevi W, NP   albuterol (PROVENTIL) (2.5 MG/3ML) 0.083% nebulizer solution 3 mL, 3 mL, Inhalation, Q6H PRN, Kerney Elbe, MD   aspirin tablet 325 mg, 325 mg, Oral, Daily, Rosalin Hawking, MD, 325 mg at 09/23/21 4696   chlorhexidine gluconate (MEDLINE KIT) (PERIDEX) 0.12 % solution 15 mL, 15 mL, Mouth Rinse, BID, Deveshwar, Sanjeev, MD, 15 mL at 09/23/21 0919   Chlorhexidine Gluconate Cloth  2 % PADS 6 each, 6 each, Topical, Daily, Deveshwar, Sanjeev, MD, 6 each at 09/22/21 1200   digoxin (LANOXIN) tablet 0.125 mg, 0.125 mg, Per Tube, Daily, Deveshwar, Sanjeev, MD, 0.125 mg at 09/22/21 2116   docusate (COLACE) 50 MG/5ML liquid 100 mg, 100 mg, Per Tube, BID, Andres Labrum D, PA-C, 100 mg at 09/21/21 2117   empagliflozin (JARDIANCE) tablet 10 mg, 10 mg, Oral, Daily, Agarwala, Ravi, MD, 10 mg at 09/23/21 0919   enoxaparin (LOVENOX) injection 40 mg, 40 mg, Subcutaneous, Q24H, Rosalin Hawking, MD   fentaNYL (SUBLIMAZE) injection 50 mcg, 50 mcg, Intravenous, Q15 min PRN, Mick Sell, PA-C, 50 mcg at 09/21/21 0815   polyethylene glycol (MIRALAX / GLYCOLAX) packet 17 g, 17 g, Per Tube, Daily, Deveshwar, Sanjeev, MD, 17 g at 09/21/21 1952   potassium chloride (KLOR-CON) packet 20 mEq, 20 mEq, Per Tube, Daily, Deveshwar, Sanjeev, MD, 20 mEq at 09/22/21 2122   sacubitril-valsartan (ENTRESTO) 49-51 mg per tablet, 1 tablet, Oral, BID, Agarwala, Ravi, MD, 1 tablet at 09/23/21 0919   senna-docusate (Senokot-S) tablet 1 tablet, 1 tablet, Oral, QHS PRN, Rikki Spearing, NP   sodium chloride flush (NS) 0.9 % injection 3 mL, 3 mL, Intravenous, Once, Elnora Morrison, MD   spironolactone (ALDACTONE) tablet 25 mg, 25 mg, Oral, Daily, Agarwala, Ravi, MD, 25 mg at 09/23/21 2952  Patients Current Diet:  Diet Order             DIET DYS 3 Room service appropriate? No; Fluid consistency: Thin  Diet effective now                   Precautions / Restrictions Precautions Precautions: Fall Restrictions Weight Bearing Restrictions: No   Has the patient had 2 or more falls or a fall with injury in the past year? Yes and related to this CVA  Prior Activity Level Community (5-7x/wk): independent prior to admit, no DME used, driving  Prior Functional Level Self Care: Did the patient need help bathing, dressing, using the toilet or eating? Independent  Indoor Mobility: Did the patient need assistance  with walking from room to room (with or without device)? Independent  Stairs: Did the patient need assistance with internal or external stairs (with or without device)? Independent  Functional Cognition: Did the patient need help planning regular tasks such as shopping or remembering to take medications? Independent  Patient Information Are you of Hispanic, Latino/a,or Spanish origin?: A. No, not of Hispanic, Latino/a, or Spanish origin What is your race?: B. Black or African American Do you need or want an interpreter to communicate with a doctor or health care staff?: 0. No  Patient's Response To:  Health Literacy and Transportation Is the patient able to respond to health literacy and transportation needs?: Yes Health Literacy -  How often do you need to have someone help you when you read instructions, pamphlets, or other written material from your doctor or pharmacy?: Never In the past 12 months, has lack of transportation kept you from medical appointments or from getting medications?: No In the past 12 months, has lack of transportation kept you from meetings, work, or from getting things needed for daily living?: No  Development worker, international aid / [place] Devices/Equipment: None Home Equipment: Cane - single point  Prior Device Use: Indicate devices/aids used by the patient prior to current illness, exacerbation or injury? None of the above  Current Functional Level Cognition  Overall Cognitive Status: Impaired/Different from baseline Current Attention Level: Focused Orientation Level: Oriented to person Following Commands: Follows one step commands inconsistently Safety/Judgement: Decreased awareness of safety, Decreased awareness of deficits General Comments: pt perseverating on wanting to find his clothes, not easily redirectible    Extremity Assessment (includes Sensation/Coordination)  Upper Extremity Assessment: LUE deficits/detail LUE Deficits / Details:  pt with poor attention to LUE, difficult to formally assess sensation due to cognitive deficits. Does move extremity against gravity.  Lower Extremity Assessment: Generalized weakness, Difficult to assess due to impaired cognition (possibly impaired attention to LLE)    ADLs       Mobility  Overal bed mobility: Needs Assistance Bed Mobility: Supine to Sit, Sit to Supine Supine to sit: Supervision, HOB elevated Sit to supine: +2 for physical assistance, Total assist General bed mobility comments: pt resistant to returning to supine    Transfers  Overall transfer level: Needs assistance Equipment used: 1 person hand held assist Transfers: Sit to/from Stand Sit to Stand: Min assist    Ambulation / Gait / Stairs / Wheelchair Mobility  Ambulation/Gait Ambulation/Gait assistance: Herbalist (Feet): 4 Feet (4' forward and backward twice) Assistive device: None Gait Pattern/deviations: Step-to pattern General Gait Details: pt with slowed step-to gait, increased lateral sway, multiple verbal cues to sequence transition from forward to backward Gait velocity: reduced Gait velocity interpretation: <1.31 ft/sec, indicative of household ambulator    Posture / Balance Balance Overall balance assessment: Needs assistance Sitting-balance support: No upper extremity supported, Feet supported Sitting balance-Leahy Scale: Fair Standing balance support: No upper extremity supported, During functional activity Standing balance-Leahy Scale: Poor    Special needs/care consideration N/a   Previous Home Environment (from acute therapy documentation) Living Arrangements: Spouse/significant other Available Help at Discharge: Family, Available 24 hours/day Type of Home: House Home Layout: One level Home Access: Ramped entrance Bathroom Shower/Tub: Chiropodist: [place]: No  Discharge Living Setting Plans for Discharge Living Setting: Patient's  home, Lives with (comment) (spouse) Type of Home at Discharge: House Discharge Home Layout: One level Discharge Home Access: Ramped entrance, Level entry Discharge Bathroom Shower/Tub: Tub/shower unit Discharge Bathroom Toilet: Standard Discharge Bathroom Accessibility: Yes How Accessible: Accessible via walker Does the patient have any problems obtaining your medications?: No  Social/Family/Support Systems Patient Roles: Spouse, Parent Anticipated Caregiver: Danelle Earthly Anticipated Caregiver's Contact Information: [phone] Ability/Limitations of Caregiver: n/a Caregiver Availability: 24/7 Discharge Plan Discussed with Primary Caregiver: Yes Is Caregiver In Agreement with Plan?: Yes Does Caregiver/Family have Issues with Lodging/Transportation while Pt is in Rehab?: No  Goals Patient/Family Goal for Rehab: PT/OT/SLP supervision to mod I Expected length of stay: 9-12 days Additional Information: reviewed cost of care with spouse on 2/24 Pt/Family Agrees to Admission and willing to participate: Yes Program Orientation Provided & Reviewed with Pt/Caregiver Including Roles  &  Responsibilities: Yes  Barriers to Discharge: Insurance for SNF coverage  Decrease burden of Care through IP rehab admission: n/a  Possible need for SNF placement upon discharge: not anticipated.  Good support from wife and family.   Patient Condition: I have reviewed medical records from [facility], spoken with CM, and spouse. I discussed via phone for inpatient rehabilitation assessment.  Patient will benefit from ongoing PT, OT, and SLP, can actively participate in 3 hours of therapy a day 5 days of the week, and can make measurable gains during the admission.  Patient will also benefit from the coordinated team approach during an Inpatient Acute Rehabilitation admission.  The patient will receive intensive therapy as well as Rehabilitation physician, nursing, social worker, and care management  interventions.  Due to bladder management, bowel management, safety, skin/wound care, disease management, medication administration, pain management, and patient education the patient requires 24 hour a day rehabilitation nursing.  The patient is currently min assist with mobility and basic ADLs.  Discharge setting and therapy post discharge at  home  is anticipated.  Patient has agreed to participate in the Acute Inpatient Rehabilitation Program and will admit Saturday 2/25.  Preadmission Screen Completed By:  Michel Santee, 09/23/2021 1:08 PM ______________________________________________________________________   Discussed status with Dr. Posey Pronto on 09/23/21  at 1:12 PM  and received approval for admission today.  Admission Coordinator:  Michel Santee, PT, time 1:12 PM Sudie Grumbling 09/23/21    Assessment/Plan: Diagnosis: CVA Does the need for close, 24 hr/day Medical supervision in concert with the patient's rehab needs make it unreasonable for this patient to be served in a less intensive setting? 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   Due to safety, disease management, medication administration, pain management, and patient education, does the patient require 24 hr/day rehab nursing? Potentially Does the patient require coordinated care of a physician, rehab nurse, PT, OT, and SLP to address physical and functional deficits in the context of the above medical diagnosis(es)? Potentially Addressing deficits in the following areas: balance, endurance, locomotion, bathing, dressing, toileting, cognition, and psychosocial support Can the patient actively participate in an intensive therapy program of at least 3 hrs of therapy 5 days a week? Yes The potential for patient to make measurable gains while on inpatient  rehab is fair Anticipated functional outcomes upon discharge from inpatient rehab: modified independent PT, modified independent OT, modified independent SLP Estimated rehab length of stay to reach the above functional goals is: 2-4 days. Anticipated discharge destination: Home 10. Overall Rehab/Functional Prognosis: good   MD Signature: Delice Lesch, MD, ABPMR

## 2021-09-23 NOTE — Progress Notes (Signed)
Physical Therapy Treatment Patient Details Name: Carlos Mckinney MRN: [id number] DOB: 1996/01/13 Today's Date: 09/23/2021   History of Present Illness Pt is 26 yo male with R gaze preference, LUE and LE weakness, global aphasia, and agitation. Pt taken to IR with revascularization of R MCA.   PMH: advanced CHF, tobacco abuse, cocaine abuse, obesity, DVT.    PT Comments    Pt tolerates treatment well, ambulating for increased distances. Pt continues to demonstrate deficits in receptive communication, often appearing to not hear PT and repeat the same question this PT has already answered. Pt demonstrates reduced awareness of his deficits and medical situation. PT is unable to assess dual-task gait activities due to pt's impaired communication. Pt will continue to benefit from assessment of dynamic gait tasks to ensure balance is back to baseline.   Recommendations for follow up therapy are one component of a multi-disciplinary discharge planning process, led by the attending physician.  Recommendations may be updated based on patient status, additional functional criteria and insurance authorization.  Follow Up Recommendations  Acute inpatient rehab (3hours/day)     Assistance Recommended at Discharge Frequent or constant Supervision/Assistance  Patient can return home with the following A little help with walking and/or transfers;A little help with bathing/dressing/bathroom;Assistance with cooking/housework;Direct supervision/assist for medications management;Direct supervision/assist for financial management;Assist for transportation;Help with stairs or ramp for entrance   Equipment Recommendations  None recommended by PT    Recommendations for Other Services       Precautions / Restrictions Precautions Precautions: Fall Restrictions Weight Bearing Restrictions: No     Mobility  Bed Mobility                    Transfers Overall transfer level: Needs assistance Equipment  used: None Transfers: Sit to/from Stand Sit to Stand: Supervision                Ambulation/Gait Ambulation/Gait assistance: Supervision Gait Distance (Feet): 250 Feet (x 2 trials) Assistive device: None Gait Pattern/deviations: Step-through pattern Gait velocity: reduced Gait velocity interpretation: 1.31 - 2.62 ft/sec, indicative of limited community ambulator   General Gait Details: pt with slowed step-through gait. PT unable to assess dynamic gait tasks at this time due to deficits in receptive communication. Pt often asking PT which direction to turn, and then asking immediately again after PT had provided an answer, seeming to respond better to visual cues   Stairs             Wheelchair Mobility    Modified Rankin (Stroke Patients Only) Modified Rankin (Stroke Patients Only) Pre-Morbid Rankin Score: No symptoms Modified Rankin: Moderately severe disability     Balance Overall balance assessment: Needs assistance Sitting-balance support: No upper extremity supported, Feet supported Sitting balance-Leahy Scale: Good     Standing balance support: No upper extremity supported, During functional activity Standing balance-Leahy Scale: Good                              Cognition Arousal/Alertness: Awake/alert Behavior During Therapy: Impulsive Overall Cognitive Status: Impaired/Different from baseline Area of Impairment: Following commands, Safety/judgement, Awareness, Problem solving                       Following Commands: Follows one step commands inconsistently Safety/Judgement: Decreased awareness of safety, Decreased awareness of deficits Awareness: Intellectual Problem Solving: Slow processing, Difficulty sequencing (visual cues) General Comments: receptive component complicates true cognitive assessment.  Exercises      General Comments General comments (skin integrity, edema, etc.): VSS on RA      Pertinent  Vitals/Pain Pain Assessment Pain Assessment: No/denies pain    Home Living Family/patient expects to be discharged to:: Private residence Living Arrangements: Spouse/significant other Available Help at Discharge: Family;Available 24 hours/day Type of Home: House Home Access: Ramped entrance       Home Layout: One level Home Equipment: Cane - single point      Prior Function            PT Goals (current goals can now be found in the care plan section) Acute Rehab PT Goals Patient Stated Goal: to go home Progress towards PT goals: Progressing toward goals    Frequency    Min 4X/week      PT Plan Current plan remains appropriate    Co-evaluation              AM-PAC PT "6 Clicks" Mobility   Outcome Measure  Help needed turning from your back to your side while in a flat bed without using bedrails?: None Help needed moving from lying on your back to sitting on the side of a flat bed without using bedrails?: None Help needed moving to and from a bed to a chair (including a wheelchair)?: A Little Help needed standing up from a chair using your arms (e.g., wheelchair or bedside chair)?: A Little Help needed to walk in hospital room?: A Little Help needed climbing 3-5 steps with a railing? : A Little 6 Click Score: 20    End of Session   Activity Tolerance: Patient tolerated treatment well Patient left: in chair;with call bell/phone within reach;with chair alarm set;with family/visitor present Nurse Communication: Mobility status PT Visit Diagnosis: Other abnormalities of gait and mobility (R26.89);Other symptoms and signs involving the nervous system (R29.898);Muscle weakness (generalized) (M62.81)     Time: [id number] PT Time Calculation (min) (ACUTE ONLY): 23 min  Charges:  $Gait Training: 23-37 mins                     Zenaida Niece, PT, DPT Acute Rehabilitation Pager: [phone] Office [place] 09/23/2021, 2:57 PM

## 2021-09-23 NOTE — Progress Notes (Addendum)
STROKE TEAM PROGRESS NOTE   INTERVAL HISTORY Patient is seen in bed. He is able to speak and able to write sentences related to the thoughts he is having. Perseverates on his idea, currently focusing on a phone number and using the phone. He is not responding to questions that are asked to him verbally or written. He is currently asking for a phone and trying to use his remote as a phone. When staff attempts to redirect him it is unsuccessful. He was able to state his wife's phone number, write her phone number, and write the phase "can I use a phone?." He dialed the phone when it was handed to him but he was unable to follow the direction of dialing "9-1" first. SLP at the bedside to do a further cognitive exam to determine if these are likely related to cognitive, processing, or auditory deficits.    Vitals:   09/23/21 0500 09/23/21 0600 09/23/21 0700 09/23/21 0800  BP: (!) 101/53 (!) 95/51 (!) 92/53   Pulse: 88     Resp: (!) 25 (!) 29 (!) 24   Temp:    98.5 F (36.9 C)  TempSrc:    Oral  SpO2: 96% 100%    Weight:      Height:       CBC:  Recent Labs  Lab 09/22/21 0548 09/23/21 0705  WBC 6.7 7.0  NEUTROABS 5.0 5.5  HGB 10.5* 11.3*  HCT 34.0* 36.3*  MCV 90.9 90.3  PLT 270 309    Basic Metabolic Panel:  Recent Labs  Lab 09/22/21 0548 09/23/21 0705  NA 136 139  K 4.1 3.6  CL 105 106  CO2 21* 19*  GLUCOSE 86 71  BUN 5* <5*  CREATININE 1.07 1.10  CALCIUM 9.0 8.9    Lipid Panel:  Recent Labs  Lab 09/21/21 0556  CHOL 87  TRIG 80  HDL 26*  CHOLHDL 3.3  VLDL 16  LDLCALC 45    HgbA1c:  Recent Labs  Lab 09/21/21 0557  HGBA1C 4.7*    Urine Drug Screen:  Recent Labs  Lab 09/20/21 1539  LABOPIA NONE DETECTED  COCAINSCRNUR POSITIVE*  LABBENZ NONE DETECTED  AMPHETMU NONE DETECTED  THCU POSITIVE*  LABBARB NONE DETECTED     Alcohol Level  Recent Labs  Lab 09/20/21 1534  ETH <10     IMAGING past 24 hours IR CT Head Ltd  Result Date:  09/22/2021 INDICATION: New onset right gaze deviation, left-sided weakness and slurred speech. Occluded superior division of the right middle cerebral artery proximally on CT angiogram of the head and neck. EXAM: 1. EMERGENT LARGE VESSEL OCCLUSION THROMBOLYSIS (anterior CIRCULATION) COMPARISON:  CT angiogram of the head and neck of September 20, 2021. MEDICATIONS: Ancef 2 g IV antibiotic was administered within 1 hour of the procedure. ANESTHESIA/SEDATION: General anesthesia. CONTRAST:  Omnipaque 300 approximately 100 mL. FLUOROSCOPY TIME:  Fluoroscopy Time: 18 minutes 0 seconds (1901 mGy). COMPLICATIONS: None immediate. TECHNIQUE: Following a full explanation of the procedure along with the potential associated complications, an informed witnessed consent was obtained. The risks of intracranial hemorrhage of 10%, worsening neurological deficit, ventilator dependency, death and inability to revascularize were all reviewed in detail with the patient's mother. The patient was then put under general anesthesia by the Department of Anesthesiology at [facility]. The right groin was prepped and draped in the usual sterile fashion. Thereafter using modified Seldinger technique, transfemoral access into the right common femoral artery was obtained without difficulty. Over a 0.035  inch guidewire an 8 French 25 Pinnacle sheath was inserted. Through this, and also over a 0.035 inch guidewire a 5 [place] JB 1 catheter was advanced to the aortic arch region and selectively positioned in the innominate artery and the right common carotid artery. FINDINGS: The innominate arteriogram demonstrates the origin of the right subclavian artery and the right common carotid artery to be widely patent. The right common carotid arteriogram demonstrates the right external carotid artery and its major branches to be widely patent. The right internal carotid artery at the bulb to the cranial skull base is widely patent. The petrous, the  cavernous and the supraclinoid segments demonstrated tortuosity with wide patency. The right anterior cerebral artery opacifies into the capillary and venous phases. The right middle cerebral artery demonstrates angiographic occlusion of the superior division proximally. PROCEDURE: Diagnostic JB 1 catheter in the right common carotid artery was then exchanged over a 0.035 inch 300 cm Rosen exchange guidewire for an 087 95 cm balloon guide catheter which had been prepped in 50% contrast and 50% heparinized saline infusion. The balloon guide was advanced to the distal right internal carotid artery. The guidewire was removed. Good aspiration obtained from the hub of the balloon guide catheter. Gentle control arteriogram performed through this demonstrates mild spasm in the cervical right ICA which responded to 25 mcg of nitroglycerin intra-arterially. More distally, the middle cerebral artery and the anterior cerebral artery demonstrated no change. Over a 0.014 inch micro guidewire, an 021 Trakstar 160 cm microcatheter was then advanced inside of a 6 [place] 017 130 cm support catheter to the supraclinoid right ICA. The micro guidewire was then gently manipulated without difficulty with a torque device and advanced through the occluded superior division into the M2 M3 region followed by the microcatheter. An 071 aspiration catheter was advanced to the distal right M1 segment. A 3 mm x 40 mm Solitaire X retrieval device was then advanced to the distal end of the microcatheter. The O ring on the delivery microcatheter was loosened. With slight forward gentle traction with the right hand on the delivery micro guidewire with the left hand the delivery microcatheter was retrieved deploying the retrieval device. With proximal flow arrest in the right internal carotid artery, and constant aspiration applied at the hub of the 071 aspiration catheter with a Penumbra pump, and a 20 mL syringe at the hub of the balloon guide  catheter for approximately 3 minutes, the retrieval device, the microcatheter, and the 071 aspiration catheter were retrieved and removed. Control arteriogram performed through the balloon guide catheter in the right internal carotid artery demonstrated moderate spasm in the cervical right ICA. However, flow was noted into the petrous, the cavernous and the supraclinoid segments with now patency noted of the superior division. Significant spasm was appreciated in the right middle cerebral artery distally, extending into the proximal superior division. The balloon guide was retrieved more proximally into the right common carotid artery. Aliquots of 25 mcg of intra-arterial nitroglycerin was then given x5 with progressive improved caliber of the right internal carotid artery distally, and also the supraclinoid right ICA and revascularized superior division. A TICI 2C revascularization was achieved. The right anterior cerebral artery remained widely patent. The balloon guide was retrieved into the right common carotid artery. A control arteriogram performed through this demonstrated excellent flow through the extracranial and intracranial internal carotid artery. Wide patency was now noted of the right middle cerebral M1 segment, and the right recanalized superior division again maintaining a  TICI 2C revascularization. Right anterior cerebral artery remained unchanged widely patent. The balloon guide was retrieved and removed. The 8 French Pinnacle sheath was removed with successful hemostasis at the right groin puncture site with an 8 French Angio-Seal closure device. Distal pulses remained palpable in both feet unchanged at the end of the procedure. A flat panel CT of the brain demonstrated no evidence of intracranial hemorrhage, or mass effect or of ventriculomegaly. Patient was left intubated in view of patient's medical condition, and the patient not being able to communicate at the beginning of the procedure.  IMPRESSION: Status post endovascular complete revascularization of occluded right middle cerebral artery superior division with 1 pass with a 3 mm x 40 mm Solitaire X retrieval device, and contact aspiration achieving a TICI 2C revascularization. PLAN: As per referring MD. Electronically Signed   By: Julieanne Cotton M.D.   On: 09/22/2021 10:55    PHYSICAL EXAM  Temp:  [97.8 F (36.6 C)-99.1 F (37.3 C)] 98.5 F (36.9 C) (02/24 0800) Pulse Rate:  [64-184] 88 (02/24 0500) Resp:  [15-42] 24 (02/24 0700) BP: (88-114)/(43-76) 92/53 (02/24 0700) SpO2:  [90 %-100 %] 100 % (02/24 0600) Arterial Line BP: (92-122)/(60-72) 115/71 (02/23 1600)  General - Well nourished, well developed, sleepy and lethargic.  Ophthalmologic - fundi not visualized due to noncooperation.  Cardiovascular - Regular rhythm and rate.  Neuro - Eyes open, sitting in bed.  Is not following commands. Was able to mimic simple hand motions. Perseverates on single thoughts verbally and written. Does not give any indication of understanding verbal or written words/phrases. Receptive aphasia, no dysarthria. No significant nasolabial fold flattening. Tongue protrusion not cooperative. Moves all extremities antigravity, sensation is intact.   ASSESSMENT/PLAN Mr. Carlos Mckinney is a 26 y.o. male with history of CHF, cardiogenic shock, DVT on Eliquis, cocaine use presenting after going to sleep at 2200 on 2/20.  Family states that at 4 AM they heard him fall and got back into bed, they saw him again at 8 AM and he was slurring words, had left-sided weakness, difficulty walking and was acting "strange".  Initial NIH SS 24.  Decreased level consciousness, disoriented, not following commands, right gaze preference, left hemianopsia, left facial droop, left arm weakness, left leg weakness, decreased sensation on the left, global aphasia, sensory neglect.  Not a candidate for TNK due to Eliquis, taken to IR emergently. Extubated 09/22/2021.    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 Small vessel disease. Atrophy. ASPECTS 10.    CTA head & neck M1 MCAs patent with suspected right superior M2 MCA occlusion or severe stenosis in the region of right anterior MCA territory penumbra CT perfusion penumbra in the right anterior MCA territory and the left posterior MCA territory. Overall penumbra is estimated to be 17 mL. No core infarct identified Post IR CT No ICH MRI large area of restricted diffusion in the right MCA territory, moderate to large acute infarct in the left temporal lobe  2D Echo 09/02/2021-EF less than 20%, LV global hypokinesis, severe tricuspid regurgitation LDL 45 HgbA1c 4.5 VTE prophylaxis -Lovenox Eliquis (apixaban) daily prior to admission, now on aspirin 325 mg daily.  May consider resume AC on 09/25/21 to avoid risk of HT Therapy recommendations:  CIR Disposition: Pending  Congestive heart failure with reduced EF Home medications: Digoxin, Jardiance, Entresto, Lasix, spironolactone Now resumed home  meds Last echo 2/3-EF < 20% Recently admitted for heart failure from 2/2 - 2/8 under Dr. 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 1/7-08/08/2021- admitted to [facility] with LE swelling and dyspnea- RLE DVT and b/l pulm emboli Was discharged on Eliquis Wife is unsure if he was taking his Eliquis appropriately, he did report noticing blood in his stool recently and had discussed stopping Eliquis during last hospitalization  Respiratory failure, hypoxic Intubated for procedure and airway protection Remain intubated after procedure due to mental status Extubated on 2/23, tolerating well CCM on board  Hypotension BP goal normotensive Off Norepinephrine, improving BP fluctuates but neuro stable  Cocaine abuse UDS positive for  cocaine Cessation education will be provided  Tobacco abuse Current smoker Smoking cessation counseling will be provided  Other Stroke Risk Factors Advanced Age >/= 65  ETOH use, alcohol level <10, advised to drink no more than 2 drink(s) a day THC abuse - UDS:  THC POSITIVE. Patient will be advised to stop using due to stroke risk.  Other Active Problems   Hospital day # 3  Patient seen and examined by NP/APP with MD. MD to update note as needed.   Carlos Picker, Carlos Mckinney, Carlos Mckinney [facility] Pager: [phone]  ATTENDING NOTE: I reviewed above note and agree with the assessment and plan. Pt was seen and examined.   RN at bedside, wife just stepped out.  Patient sitting bed, keep asking how to tell the phone while holding the remote.  However, he was not directable and not follow simple commands.  However language output appropriate without word salad but with perseveration and not following commands.  Initially concerning for hearing loss, however he was also not able to read or follow commands on the clipboard.  Speech therapy is on board, agree with comprehension deficit along with cognition and memory impairment too.  However, otherwise moving all extremities.  Now on aspirin 325, consider resume anticoagulation on 2/26 around 5 days after stroke.  CHF meds resumed, BP fluctuation but neuro stable. PT/OT recommend CIR  For detailed assessment and plan, please refer to above as I have made changes wherever appropriate.   Marvel Plan, MD PhD Stroke Neurology 09/23/2021 3:40 PM  This patient is critically ill due to bilateral large MCA infarcts, cocaine abuse, CHF with low EF, DVT/PE on Eliquis PTA, and at significant risk of neurological worsening, death form recurrent stroke, hemorrhagic transformation, cerebral edema, seizure, heart failure, renal failure. This patient's care requires constant monitoring of vital signs, hemodynamics, respiratory and cardiac monitoring,  review of multiple databases, neurological assessment, discussion with family, other specialists and medical decision making of high complexity. I spent 40 minutes of neurocritical care time in the care of this patient.    To contact Stroke Continuity provider, please refer to [email or web address]. After hours, contact General Neurology

## 2021-09-24 ENCOUNTER — Inpatient Hospital Stay (HOSPITAL_COMMUNITY): Payer: Medicaid Other

## 2021-09-24 ENCOUNTER — Inpatient Hospital Stay (HOSPITAL_COMMUNITY)
Admission: RE | Admit: 2021-09-24 | Payer: Self-pay | Source: Intra-hospital | Admitting: Physical Medicine & Rehabilitation

## 2021-09-24 DIAGNOSIS — I6601 Occlusion and stenosis of right middle cerebral artery: Secondary | ICD-10-CM

## 2021-09-24 DIAGNOSIS — I639 Cerebral infarction, unspecified: Secondary | ICD-10-CM

## 2021-09-24 LAB — CBC WITH DIFFERENTIAL/PLATELET
Abs Immature Granulocytes: 0.03 10*3/uL (ref 0.00–0.07)
Basophils Absolute: 0 10*3/uL (ref 0.0–0.1)
Basophils Relative: 0 %
Eosinophils Absolute: 0.2 10*3/uL (ref 0.0–0.5)
Eosinophils Relative: 2 %
HCT: 33.9 % — ABNORMAL LOW (ref 39.0–52.0)
Hemoglobin: 10.5 g/dL — ABNORMAL LOW (ref 13.0–17.0)
Immature Granulocytes: 1 %
Lymphocytes Relative: 19 %
Lymphs Abs: 1.2 10*3/uL (ref 0.7–4.0)
MCH: 27.6 pg (ref 26.0–34.0)
MCHC: 31 g/dL (ref 30.0–36.0)
MCV: 89.2 fL (ref 80.0–100.0)
Monocytes Absolute: 0.8 10*3/uL (ref 0.1–1.0)
Monocytes Relative: 13 %
Neutro Abs: 4.2 10*3/uL (ref 1.7–7.7)
Neutrophils Relative %: 65 %
Platelets: 320 10*3/uL (ref 150–400)
RBC: 3.8 MIL/uL — ABNORMAL LOW (ref 4.22–5.81)
RDW: 22.7 % — ABNORMAL HIGH (ref 11.5–15.5)
WBC: 6.4 10*3/uL (ref 4.0–10.5)
nRBC: 0 % (ref 0.0–0.2)

## 2021-09-24 LAB — BASIC METABOLIC PANEL
Anion gap: 9 (ref 5–15)
BUN: <5 mg/dL — ABNORMAL LOW (ref 6–20)
CO2: 23 mmol/L (ref 22–32)
Calcium: 8.6 mg/dL — ABNORMAL LOW (ref 8.9–10.3)
Chloride: 108 mmol/L (ref 98–111)
Creatinine, Ser: 0.94 mg/dL (ref 0.61–1.24)
GFR, Estimated: >60 mL/min (ref >60)
Glucose, Bld: 85 mg/dL (ref 70–99)
Potassium: 3.4 mmol/L — ABNORMAL LOW (ref 3.5–5.1)
Sodium: 140 mmol/L (ref 135–145)

## 2021-09-24 MED ORDER — RIVAROXABAN 10 MG PO TABS: 10.0000 mg | ORAL_TABLET | Freq: Every day | ORAL | 1 refills | Status: DC

## 2021-09-24 NOTE — Plan of Care (Signed)
°  Pt is alert oriented x 1. Pt c/o pain to left arm, prn tylenol given per order. Pt noted resting. No distress noted.   e: Progressing Goal: Knowledge of secondary prevention will improve (SELECT ALL) Outcome: Progressing Goal: Knowledge of patient specific risk factors will improve (INDIVIDUALIZE FOR PATIENT) Outcome: Progressing Goal: Individualized Educational Video(s) Outcome: Progressing   Problem: Coping: Goal: Will verbalize positive feelings about self Outcome: Progressing Goal: Will identify appropriate support needs Outcome: Progressing   Problem: Health Behavior/Discharge Planning: Goal: Ability to manage health-related needs will improve Outcome: Progressing   Problem: Self-Care: Goal: Ability to participate in self-care as condition permits will improve Outcome: Progressing Goal: Verbalization of feelings and concerns over difficulty with self-care will improve Outcome: Progressing Goal: Ability to communicate needs accurately will improve Outcome: Progressing   Problem: Nutrition: Goal: Risk of aspiration will decrease Outcome: Progressing Goal: Dietary intake will improve Outcome: Progressing   Problem: Ischemic Stroke/TIA Tissue Perfusion: Goal: Complications of ischemic stroke/TIA will be minimized Outcome: Progressing   Problem: Safety: Goal: Non-violent Restraint(s) Outcome: Progressing   Problem: Education: Goal: Knowledge of General Education information will improve Description: Including pain rating scale, medication(s)/side effects and non-pharmacologic comfort measures Outcome: Progressing   Problem: Health Behavior/Discharge Planning: Goal: Ability to manage health-related needs will improve Outcome: Progressing   Problem: Clinical Measurements: Goal: Ability to maintain clinical measurements within normal limits will improve Outcome: Progressing Goal: Will remain free from infection Outcome: Progressing Goal: Diagnostic test results  will improve Outcome: Progressing Goal: Respiratory complications will improve Outcome: Progressing Goal: Cardiovascular complication will be avoided Outcome: Progressing   Problem: Activity: Goal: Risk for activity intolerance will decrease Outcome: Progressing   Problem: Nutrition: Goal: Adequate nutrition will be maintained Outcome: Progressing   Problem: Elimination: Goal: Will not experience complications related to bowel motility Outcome: Progressing Goal: Will not experience complications related to urinary retention Outcome: Progressing   Problem: Coping: Goal: Level of anxiety will decrease Outcome: Progressing   Problem: Pain Managment: Goal: General experience of comfort will improve Outcome: Progressing   Problem: Safety: Goal: Ability to remain free from injury will improve Outcome: Progressing   Problem: Skin Integrity: Goal: Risk for impaired skin integrity will decrease Outcome: Progressing

## 2021-09-24 NOTE — Discharge Summary (Addendum)
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   digoxin 0.125 MG tablet Commonly known as: LANOXIN Take 1 tablet (0.125 mg total) by mouth daily.   Entresto 49-51 MG Generic drug: sacubitril-valsartan Take 1 tablet by mouth 2 (two) times daily.   furosemide 40 MG tablet Commonly known as: LASIX Take 1 tablet (40 mg total) by mouth daily.   hydrocortisone 25 MG suppository Commonly known as: ANUSOL-HC Place 1 suppository (25 mg total) rectally 2 (two) times daily.   Jardiance 10 MG Tabs tablet Generic drug: empagliflozin Take 1 tablet (10 mg total) by mouth daily.   phenylephrine-shark liver oil-mineral oil-petrolatum 0.25-14-74.9 % rectal ointment Commonly known as: PREPARATION H Place 1 application rectally 2 (two) times daily as needed for hemorrhoids.   polyethylene glycol 17 g packet Commonly known as: MIRALAX / GLYCOLAX Take 17 g by mouth daily. What changed:  when to take this reasons to take this   potassium chloride 20 MEQ packet Commonly known as: KLOR-CON Use 1 packet  (20 mEq) by mouth daily.   rivaroxaban 10 MG Tabs tablet Commonly known as: XARELTO Take 1 tablet (10 mg total) by mouth daily. Start taking on: September 25, 2021   spironolactone 25 MG tablet Commonly known as: ALDACTONE Take 1 tablet (25 mg total) by mouth daily.        LABORATORY STUDIES CBC    Component Value Date/Time   WBC 6.4 09/24/2021 0052   RBC 3.80 (L) 09/24/2021 0052   HGB 10.5 (L) 09/24/2021 0052   HCT 33.9 (L) 09/24/2021 0052   PLT 320 09/24/2021 0052   MCV 89.2 09/24/2021 0052   MCH 27.6 09/24/2021 0052   MCHC 31.0 09/24/2021 0052   RDW 22.7 (H) 09/24/2021 0052   LYMPHSABS 1.2 09/24/2021 0052   MONOABS 0.8 09/24/2021 0052   EOSABS 0.2 09/24/2021 0052   BASOSABS 0.0 09/24/2021 0052   CMP    Component Value Date/Time   NA 140 09/24/2021 0052   K 3.4 (L) 09/24/2021 0052   CL 108 09/24/2021 0052   CO2 23 09/24/2021 0052   GLUCOSE 85 09/24/2021 0052   BUN <5 (L) 09/24/2021 0052   CREATININE 0.94 09/24/2021 0052   CALCIUM 8.6 (L) 09/24/2021 0052   PROT 6.1 (L) 09/21/2021 0556   ALBUMIN 2.6 (L) 09/21/2021 0556   AST 18 09/21/2021 0556   ALT 12 09/21/2021 0556   ALKPHOS 61 09/21/2021 0556   BILITOT 1.3 (H) 09/21/2021 0556   BILITOT 1.2 09/21/2021 0556   GFRNONAA >60 09/24/2021  [place] Results  Component Value Date   INR 1.4 (H) 09/21/2021   INR 1.3 (H) 09/20/2021   Lipid Panel    Component Value Date/Time   CHOL 87 09/21/2021 0556   TRIG 80 09/21/2021 0556   HDL 26 (L) 09/21/2021 0556   CHOLHDL 3.3 09/21/2021 0556   VLDL 16 09/21/2021 0556   LDLCALC 45 09/21/2021 0556   HgbA1C  Lab Results  Component Value Date   HGBA1C 4.7 (L) 09/21/2021   Urinalysis    Component Value Date/Time   COLORURINE YELLOW 09/20/2021 1539   APPEARANCEUR CLEAR 09/20/2021 1539   LABSPEC <1.005 (L) 09/20/2021 1539   PHURINE 7.0 09/20/2021 1539   GLUCOSEU >=500 (A) 09/20/2021 1539   HGBUR NEGATIVE 09/20/2021 1539   BILIRUBINUR NEGATIVE 09/20/2021  1539   KETONESUR NEGATIVE 09/20/2021 1539   PROTEINUR NEGATIVE 09/20/2021 1539   NITRITE NEGATIVE 09/20/2021 1539   LEUKOCYTESUR NEGATIVE 09/20/2021 1539   Urine Drug Screen     Component Value Date/Time   LABOPIA NONE DETECTED 09/20/2021 1539   COCAINSCRNUR POSITIVE (A) 09/20/2021 1539   LABBENZ NONE DETECTED 09/20/2021 1539   AMPHETMU NONE DETECTED 09/20/2021 1539   THCU POSITIVE (A) 09/20/2021 1539   LABBARB NONE DETECTED 09/20/2021 1539    Alcohol Level    Component Value Date/Time   ETH <10 09/20/2021 1534     SIGNIFICANT DIAGNOSTIC STUDIES DG Chest 2 View  Result Date: 09/06/2021 CLINICAL DATA:  Difficulty breathing, pleural effusion EXAM: CHEST - 2 VIEW COMPARISON:  Previous studies including the examination of 09/02/2021 FINDINGS: Transverse diameter of heart is increased. Central pulmonary vessels are prominent. There is improvement in aeration of right lower lung fields. There are no signs of pulmonary edema or new focal infiltrates. Small right pleural effusion is seen. There is no pneumothorax. Tip of PICC line is seen in the superior vena cava. IMPRESSION: Cardiomegaly. There are no signs of pulmonary edema or new focal infiltrates. Small right pleural effusion. Electronically Signed   By: Elmer Picker M.D.   On: 09/06/2021 08:20   DG Chest 2 View  Result Date: 09/02/2021 CLINICAL DATA:  CHF, cough, headache EXAM: CHEST - 2 VIEW COMPARISON:  Chest radiograph 07/18/2021. FINDINGS: A right upper extremity PICC is in place with the tip terminating in the expected location of the cavoatrial junction. The heart is markedly enlarged, unchanged. The upper mediastinal contours are stable. There is a new small right pleural effusion with mild adjacent airspace disease likely reflecting atelectasis. There is no left pleural effusion. There is no other focal consolidation. There is no overt pulmonary edema. There is no pneumothorax. There is no acute osseous abnormality.  IMPRESSION: Unchanged marked cardiomegaly with a new small right pleural effusion and adjacent airspace opacity likely reflecting atelectasis. Electronically Signed   By: Valetta Mole M.D.   On: 09/02/2021 10:55   CT HEAD WO CONTRAST (5MM)  Result Date: 09/24/2021 CLINICAL DATA:  Right gaze preference with left upper and lower extremity weakness and aphasia. EXAM: CT HEAD WITHOUT CONTRAST TECHNIQUE: Contiguous axial images were obtained from the base of the skull through the vertex without intravenous contrast. RADIATION DOSE REDUCTION: This exam was performed according to the departmental dose-optimization program which includes automated exposure control, adjustment of the mA and/or kV according to patient size and/or use of iterative reconstruction technique. COMPARISON:  Brain MRI from 3 days ago FINDINGS: Brain: Known large, acute infarcts in the bilateral MCA territory, particularly extensive on the right and underestimated  on the left compared to brain MRI. No hemorrhage, hydrocephalus, or masslike finding. Vascular: No hyperdense vessel or unexpected calcification. Skull: Normal. Negative for fracture or focal lesion. Sinuses/Orbits: No acute finding. IMPRESSION: Large, acute bilateral MCA territory infarcts, more extensive on the right and underestimated compared to prior brain MRI. No hemorrhage or visible progression. Electronically Signed   By: Jorje Guild M.D.   On: 09/24/2021 10:03   MR BRAIN WO CONTRAST  Result Date: 09/21/2021 CLINICAL DATA:  Right MCA occlusion, status post thrombectomy. EXAM: MRI HEAD WITHOUT CONTRAST TECHNIQUE: Multiplanar, multiecho pulse sequences of the brain and surrounding structures were obtained without intravenous contrast. COMPARISON:  CTA 09/20/2021, no prior MRI FINDINGS: Brain: Restricted diffusion with ADC correlate in the right MCA territory, primarily in the right insula (series 5, image 78), right frontal lobe (series 5, images 83-88), and anterior to  mid right temporal lobe (series 5, image 78). Restricted diffusion with ADC correlate is also seen in the left temporal lobe (series 5, image 76-79). These areas are associated with increased T2 signal, right-greater-than-left, and gyral swelling. No acute hemorrhage, mass, mass effect, or midline shift. No hydrocephalus or extra-axial collection. Vascular: Normal flow voids. Skull and upper cervical spine: Normal marrow signal. Sinuses/Orbits: Mucosal thickening in the ethmoid air cells and maxillary sinuses. The orbits are unremarkable. Other: The mastoids are well aerated. IMPRESSION: 1. Status post thrombectomy with large area of restricted diffusion in the right MCA territory, in both the right frontal and temporal lobes, consistent with acute infarcts. 2. Additional moderate to large acute infarct in the left temporal lobe in the left MCA territory. . Electronically Signed   By: Merilyn Baba M.D.   On: 09/21/2021 03:17   IR CT Head Ltd  Result Date: 09/22/2021 INDICATION: New onset right gaze deviation, left-sided weakness and slurred speech. Occluded superior division of the right middle cerebral artery proximally on CT angiogram of the head and neck. EXAM: 1. EMERGENT LARGE VESSEL OCCLUSION THROMBOLYSIS (anterior CIRCULATION) COMPARISON:  CT angiogram of the head and neck of September 20, 2021. MEDICATIONS: Ancef 2 g IV antibiotic was administered within 1 hour of the procedure. ANESTHESIA/SEDATION: General anesthesia. CONTRAST:  Omnipaque 300 approximately 100 mL. FLUOROSCOPY TIME:  Fluoroscopy Time: 18 minutes 0 seconds (1901 mGy). COMPLICATIONS: None immediate. TECHNIQUE: Following a full explanation of the procedure along with the potential associated complications, an informed witnessed consent was obtained. The risks of intracranial hemorrhage of 10%, worsening neurological deficit, ventilator dependency, death and inability to revascularize were all reviewed in detail with the patient's mother.  The patient was then put under general anesthesia by the Department of Anesthesiology at [facility]. The right groin was prepped and draped in the usual sterile fashion. Thereafter using modified Seldinger technique, transfemoral access into the right common femoral artery was obtained without difficulty. Over a 0.035 inch guidewire an 8 French 25 Pinnacle sheath was inserted. Through this, and also over a 0.035 inch guidewire a 5 [place] JB 1 catheter was advanced to the aortic arch region and selectively positioned in the innominate artery and the right common carotid artery. FINDINGS: The innominate arteriogram demonstrates the origin of the right subclavian artery and the right common carotid artery to be widely patent. The right common carotid arteriogram demonstrates the right external carotid artery and its major branches to be widely patent. The right internal carotid artery at the bulb to the cranial skull base is widely patent. The petrous, the cavernous and the supraclinoid segments demonstrated tortuosity  with wide patency. The right anterior cerebral artery opacifies into the capillary and venous phases. The right middle cerebral artery demonstrates angiographic occlusion of the superior division proximally. PROCEDURE: Diagnostic JB 1 catheter in the right common carotid artery was then exchanged over a 0.035 inch 300 cm Rosen exchange guidewire for an 087 95 cm balloon guide catheter which had been prepped in 50% contrast and 50% heparinized saline infusion. The balloon guide was advanced to the distal right internal carotid artery. The guidewire was removed. Good aspiration obtained from the hub of the balloon guide catheter. Gentle control arteriogram performed through this demonstrates mild spasm in the cervical right ICA which responded to 25 mcg of nitroglycerin intra-arterially. More distally, the middle cerebral artery and the anterior cerebral artery demonstrated no change. Over a 0.014  inch micro guidewire, an 021 Trakstar 160 cm microcatheter was then advanced inside of a 6 [place] 017 130 cm support catheter to the supraclinoid right ICA. The micro guidewire was then gently manipulated without difficulty with a torque device and advanced through the occluded superior division into the M2 M3 region followed by the microcatheter. An 071 aspiration catheter was advanced to the distal right M1 segment. A 3 mm x 40 mm Solitaire X retrieval device was then advanced to the distal end of the microcatheter. The O ring on the delivery microcatheter was loosened. With slight forward gentle traction with the right hand on the delivery micro guidewire with the left hand the delivery microcatheter was retrieved deploying the retrieval device. With proximal flow arrest in the right internal carotid artery, and constant aspiration applied at the hub of the 071 aspiration catheter with a Penumbra pump, and a 20 mL syringe at the hub of the balloon guide catheter for approximately 3 minutes, the retrieval device, the microcatheter, and the 071 aspiration catheter were retrieved and removed. Control arteriogram performed through the balloon guide catheter in the right internal carotid artery demonstrated moderate spasm in the cervical right ICA. However, flow was noted into the petrous, the cavernous and the supraclinoid segments with now patency noted of the superior division. Significant spasm was appreciated in the right middle cerebral artery distally, extending into the proximal superior division. The balloon guide was retrieved more proximally into the right common carotid artery. Aliquots of 25 mcg of intra-arterial nitroglycerin was then given x5 with progressive improved caliber of the right internal carotid artery distally, and also the supraclinoid right ICA and revascularized superior division. A TICI 2C revascularization was achieved. The right anterior cerebral artery remained widely patent. The  balloon guide was retrieved into the right common carotid artery. A control arteriogram performed through this demonstrated excellent flow through the extracranial and intracranial internal carotid artery. Wide patency was now noted of the right middle cerebral M1 segment, and the right recanalized superior division again maintaining a TICI 2C revascularization. Right anterior cerebral artery remained unchanged widely patent. The balloon guide was retrieved and removed. The 8 French Pinnacle sheath was removed with successful hemostasis at the right groin puncture site with an 8 French Angio-Seal closure device. Distal pulses remained palpable in both feet unchanged at the end of the procedure. A flat panel CT of the brain demonstrated no evidence of intracranial hemorrhage, or mass effect or of ventriculomegaly. Patient was left intubated in view of patient's medical condition, and the patient not being able to communicate at the beginning of the procedure. IMPRESSION: Status post endovascular complete revascularization of occluded right middle cerebral artery superior division  with 1 pass with a 3 mm x 40 mm Solitaire X retrieval device, and contact aspiration achieving a TICI 2C revascularization. PLAN: As per referring MD. Electronically Signed   By: Luanne Bras M.D.   On: 09/22/2021 10:55   DG Chest Port 1 View  Result Date: 09/20/2021 CLINICAL DATA:  Intubated EXAM: PORTABLE CHEST 1 VIEW COMPARISON:  09/06/2021 FINDINGS: Endotracheal tube is approximately 3.8 cm above the carina. Enteric tube passes into the stomach with tip out of field of view. Stable enlargement of the cardiomediastinal silhouette. Persistent right pleural effusion and adjacent atelectasis. IMPRESSION: Lines and tubes as above. Stable cardiomegaly. Persistent right pleural effusion and adjacent atelectasis. Electronically Signed   By: Macy Mis M.D.   On: 09/20/2021 16:58   DG Abd Portable 1V  Result Date:  09/20/2021 CLINICAL DATA:  27 year old male status post orogastric tube placement. EXAM: PORTABLE ABDOMEN - 1 VIEW COMPARISON:  None. FINDINGS: Nonobstructive bowel gas pattern. The stomach is decompressed. Gastric decompression tube distal tip is positioned in the second portion the duodenum. IMPRESSION: Gastric decompression tube distal tip is position within the second portion of the duodenum. Recommend retraction by approximately 15 cm reposition within the gastric body. Electronically Signed   By: Ruthann Cancer M.D.   On: 09/20/2021 16:57   MR CARDIAC MORPHOLOGY W WO CONTRAST  Result Date: 09/05/2021 CLINICAL DATA:  Cardiomyopathy of uncertain etiology EXAM: CARDIAC MRI TECHNIQUE: The patient was scanned on a 1.5 Tesla GE magnet. A dedicated cardiac coil was used. Functional imaging was done using Fiesta sequences. 2,3, and 4 chamber views were done to assess for RWMA's. Modified Simpson's rule using a short axis stack was used to calculate an ejection fraction on a dedicated work Conservation officer, nature. The patient received 10 cc of Gadavist. After 10 minutes inversion recovery sequences were used to assess for infiltration and scar tissue. CONTRAST:  Gadavist 10 cc FINDINGS: Moderate right pleural effusion with associated atelectasis. Small circumferential pericardial effusion. The left ventricle was severely dilated with normal wall thickness. No LV thrombus noted. Diffuse LV hypokinesis, EF 12%. Moderately dilated RV with severe systolic dysfunction, EF [id number]. Moderate left and right atrial dilation. There was at least moderate tricuspid regurgitation. Probably mild mitral regurgitation. Trileaflet aortic valve with no stenosis or regurgitation noted. First pass perfusion images were normal. Delayed enhancement images showed a small area of mid-wall late gadolinium enhancement in the basal inferior wall and in the apical inferolateral wall. MEASUREMENTS: MEASUREMENTS LVEDV 482 mL LVSV 60 mL LVEF 12%  RVEDV 292 mL RVSV 53 mL RVEF 18% T1 and T2 indices not accurate due to recent Feraheme. IMPRESSION: 1.  Severely dilated LV with diffuse hypokinesis, EF 12%. 2.  Moderately dilated RV with EF 18%. 3. Delayed enhancement images difficult due to recent feraheme, but there do appear to be small areas of non-coronary LGE in the basal inferior and apical inferolateral walls. This could be suggestive of prior myocarditis. 4.  T1 and T2 parametrics not accurate with recent feraheme. Dalton Mclean Electronically Signed   By: Loralie Champagne M.D.   On: 09/05/2021 18:49   ECHOCARDIOGRAM COMPLETE  Result Date: 09/02/2021    ECHOCARDIOGRAM REPORT   Patient Name:   BUBBER GUEVARRA Date of Exam: 09/02/2021 Medical Rec #:  [id number]       Height:       71.0 in Accession #:    [id number]      Weight:       231.5 lb Date  of Birth:  Jun 11, 1996        BSA:          2.244 m Patient Age:    25 years        BP:           93/68 mmHg Patient Gender: M               HR:           113 bpm. Exam Location:  Inpatient Procedure: 2D Echo, Cardiac Doppler, Color Doppler and Intracardiac            Opacification Agent Indications:    CHF-Acute Systolic  History:        Patient has prior history of Echocardiogram examinations, most                 recent 08/07/2021. CHF; Risk Factors:Hypertension. Substance                 abuse.  Sonographer:    Clayton Lefort RDCS (AE) Referring Phys: [place]  1. No apical mural thrombus . Left ventricular ejection fraction, by estimation, is <20%. The left ventricle has severely decreased function. The left ventricle demonstrates global hypokinesis. The left ventricular internal cavity size was severely dilated. Left ventricular diastolic parameters are indeterminate.  2. Right ventricular systolic function is moderately reduced. The right ventricular size is normal. There is moderately elevated pulmonary artery systolic pressure.  3. Left atrial size was moderately dilated.  4. The  pericardial effusion is posterior to the left ventricle and localized near the right atrium.  5. The mitral valve is normal in structure. Trivial mitral valve regurgitation. No evidence of mitral stenosis.  6. Tricuspid valve regurgitation is severe.  7. The aortic valve is tricuspid. Aortic valve regurgitation is not visualized. No aortic stenosis is present.  8. The inferior vena cava is dilated in size with >50% respiratory variability, suggesting right atrial pressure of 8 mmHg. FINDINGS  Left Ventricle: No apical mural thrombus. Left ventricular ejection fraction, by estimation, is <20%. The left ventricle has severely decreased function. The left ventricle demonstrates global hypokinesis. Definity contrast agent was given IV to delineate the left ventricular endocardial borders. The left ventricular internal cavity size was severely dilated. There is no left ventricular hypertrophy. Left ventricular diastolic parameters are indeterminate. Right Ventricle: The right ventricular size is normal. Right vetricular wall thickness was not assessed. Right ventricular systolic function is moderately reduced. There is moderately elevated pulmonary artery systolic pressure. The tricuspid regurgitant  velocity is 3.26 m/s, and with an assumed right atrial pressure of 15 mmHg, the estimated right ventricular systolic pressure is [id number] mmHg. Left Atrium: Left atrial size was moderately dilated. Right Atrium: Right atrial size was normal in size. Pericardium: Trivial pericardial effusion is present. The pericardial effusion is posterior to the left ventricle and localized near the right atrium. Mitral Valve: The mitral valve is normal in structure. Trivial mitral valve regurgitation. No evidence of mitral valve stenosis. Tricuspid Valve: The tricuspid valve is normal in structure. Tricuspid valve regurgitation is severe. No evidence of tricuspid stenosis. Aortic Valve: The aortic valve is tricuspid. Aortic valve regurgitation  is not visualized. No aortic stenosis is present. Aortic valve mean gradient measures 3.0 mmHg. Aortic valve peak gradient measures 4.8 mmHg. Aortic valve area, by VTI measures 2.97 cm. Pulmonic Valve: The pulmonic valve was normal in structure. Pulmonic valve regurgitation is mild. No evidence of pulmonic stenosis. Aorta: The aortic root  is normal in size and structure. Venous: The inferior vena cava is dilated in size with greater than 50% respiratory variability, suggesting right atrial pressure of 8 mmHg. IAS/Shunts: No atrial level shunt detected by color flow Doppler.  LEFT VENTRICLE PLAX 2D LVIDd:         8.40 cm LVIDs:         7.30 cm LV PW:         1.10 cm LV IVS:        0.70 cm LVOT diam:     2.10 cm LV SV:         42 LV SV Index:   19 LVOT Area:     3.46 cm  RIGHT VENTRICLE            IVC RV Basal diam:  4.30 cm    IVC diam: 2.60 cm RV Mid diam:    2.00 cm RV S prime:     9.36 cm/s TAPSE (M-mode): 1.6 cm LEFT ATRIUM             Index        RIGHT ATRIUM           Index LA diam:        5.20 cm 2.32 cm/m   RA Area:     26.30 cm LA Vol (A2C):   54.6 ml 24.33 ml/m  RA Volume:   90.70 ml  40.42 ml/m LA Vol (A4C):   87.0 ml 38.77 ml/m LA Biplane Vol: 72.5 ml 32.31 ml/m  AORTIC VALVE AV Area (Vmax):    2.76 cm AV Area (Vmean):   2.35 cm AV Area (VTI):     2.97 cm AV Vmax:           109.00 cm/s AV Vmean:          82.700 cm/s AV VTI:            0.141 m AV Peak Grad:      4.8 mmHg AV Mean Grad:      3.0 mmHg LVOT Vmax:         87.00 cm/s LVOT Vmean:        56.000 cm/s LVOT VTI:          0.121 m LVOT/AV VTI ratio: 0.86  AORTA Ao Root diam: 2.60 cm Ao Asc diam:  2.70 cm TRICUSPID VALVE TR Peak grad:   42.5 mmHg TR Vmax:        326.00 cm/s  SHUNTS Systemic VTI:  0.12 m Systemic Diam: 2.10 cm Jenkins Rouge MD Electronically signed by Jenkins Rouge MD Signature Date/Time: 09/02/2021/6:05:52 PM    Final    IR PERCUTANEOUS ART THROMBECTOMY/INFUSION INTRACRANIAL INC DIAG ANGIO  Result Date:  09/22/2021 INDICATION: New onset right gaze deviation, left-sided weakness and slurred speech. Occluded superior division of the right middle cerebral artery proximally on CT angiogram of the head and neck. EXAM: 1. EMERGENT LARGE VESSEL OCCLUSION THROMBOLYSIS (anterior CIRCULATION) COMPARISON:  CT angiogram of the head and neck of September 20, 2021. MEDICATIONS: Ancef 2 g IV antibiotic was administered within 1 hour of the procedure. ANESTHESIA/SEDATION: General anesthesia. CONTRAST:  Omnipaque 300 approximately 100 mL. FLUOROSCOPY TIME:  Fluoroscopy Time: 18 minutes 0 seconds (1901 mGy). COMPLICATIONS: None immediate. TECHNIQUE: Following a full explanation of the procedure along with the potential associated complications, an informed witnessed consent was obtained. The risks of intracranial hemorrhage of 10%, worsening neurological deficit, ventilator dependency, death and inability to revascularize were all reviewed in detail  with the patient's mother. The patient was then put under general anesthesia by the Department of Anesthesiology at [facility]. The right groin was prepped and draped in the usual sterile fashion. Thereafter using modified Seldinger technique, transfemoral access into the right common femoral artery was obtained without difficulty. Over a 0.035 inch guidewire an 8 French 25 Pinnacle sheath was inserted. Through this, and also over a 0.035 inch guidewire a 5 [place] JB 1 catheter was advanced to the aortic arch region and selectively positioned in the innominate artery and the right common carotid artery. FINDINGS: The innominate arteriogram demonstrates the origin of the right subclavian artery and the right common carotid artery to be widely patent. The right common carotid arteriogram demonstrates the right external carotid artery and its major branches to be widely patent. The right internal carotid artery at the bulb to the cranial skull base is widely patent. The petrous, the  cavernous and the supraclinoid segments demonstrated tortuosity with wide patency. The right anterior cerebral artery opacifies into the capillary and venous phases. The right middle cerebral artery demonstrates angiographic occlusion of the superior division proximally. PROCEDURE: Diagnostic JB 1 catheter in the right common carotid artery was then exchanged over a 0.035 inch 300 cm Rosen exchange guidewire for an 087 95 cm balloon guide catheter which had been prepped in 50% contrast and 50% heparinized saline infusion. The balloon guide was advanced to the distal right internal carotid artery. The guidewire was removed. Good aspiration obtained from the hub of the balloon guide catheter. Gentle control arteriogram performed through this demonstrates mild spasm in the cervical right ICA which responded to 25 mcg of nitroglycerin intra-arterially. More distally, the middle cerebral artery and the anterior cerebral artery demonstrated no change. Over a 0.014 inch micro guidewire, an 021 Trakstar 160 cm microcatheter was then advanced inside of a 6 [place] 017 130 cm support catheter to the supraclinoid right ICA. The micro guidewire was then gently manipulated without difficulty with a torque device and advanced through the occluded superior division into the M2 M3 region followed by the microcatheter. An 071 aspiration catheter was advanced to the distal right M1 segment. A 3 mm x 40 mm Solitaire X retrieval device was then advanced to the distal end of the microcatheter. The O ring on the delivery microcatheter was loosened. With slight forward gentle traction with the right hand on the delivery micro guidewire with the left hand the delivery microcatheter was retrieved deploying the retrieval device. With proximal flow arrest in the right internal carotid artery, and constant aspiration applied at the hub of the 071 aspiration catheter with a Penumbra pump, and a 20 mL syringe at the hub of the balloon guide  catheter for approximately 3 minutes, the retrieval device, the microcatheter, and the 071 aspiration catheter were retrieved and removed. Control arteriogram performed through the balloon guide catheter in the right internal carotid artery demonstrated moderate spasm in the cervical right ICA. However, flow was noted into the petrous, the cavernous and the supraclinoid segments with now patency noted of the superior division. Significant spasm was appreciated in the right middle cerebral artery distally, extending into the proximal superior division. The balloon guide was retrieved more proximally into the right common carotid artery. Aliquots of 25 mcg of intra-arterial nitroglycerin was then given x5 with progressive improved caliber of the right internal carotid artery distally, and also the supraclinoid right ICA and revascularized superior division. A TICI 2C revascularization was achieved. The right anterior cerebral  artery remained widely patent. The balloon guide was retrieved into the right common carotid artery. A control arteriogram performed through this demonstrated excellent flow through the extracranial and intracranial internal carotid artery. Wide patency was now noted of the right middle cerebral M1 segment, and the right recanalized superior division again maintaining a TICI 2C revascularization. Right anterior cerebral artery remained unchanged widely patent. The balloon guide was retrieved and removed. The 8 French Pinnacle sheath was removed with successful hemostasis at the right groin puncture site with an 8 French Angio-Seal closure device. Distal pulses remained palpable in both feet unchanged at the end of the procedure. A flat panel CT of the brain demonstrated no evidence of intracranial hemorrhage, or mass effect or of ventriculomegaly. Patient was left intubated in view of patient's medical condition, and the patient not being able to communicate at the beginning of the procedure.  IMPRESSION: Status post endovascular complete revascularization of occluded right middle cerebral artery superior division with 1 pass with a 3 mm x 40 mm Solitaire X retrieval device, and contact aspiration achieving a TICI 2C revascularization. PLAN: As per referring MD. Electronically Signed   By: Luanne Bras M.D.   On: 09/22/2021 10:55   CT HEAD CODE STROKE WO CONTRAST  Addendum Date: 09/20/2021   ADDENDUM REPORT: 09/20/2021 13:28 ADDENDUM: On further review and correlation with findings on CTA, there is suspected hypoattenuation of the right insula which may represent sequela of acute right MCA territory infarct. Streak artifact in this region does limit evaluation and recommend correlation with the forthcoming/ordered MRI. Addendum discussed with Dr. Cheral Marker via telephone at 1:20 PM. Electronically Signed   By: Margaretha Sheffield M.D.   On: 09/20/2021 13:28   Result Date: 09/20/2021 CLINICAL DATA:  Code stroke.  Neuro deficit, acute, stroke suspected EXAM: CT HEAD WITHOUT CONTRAST TECHNIQUE: Contiguous axial images were obtained from the base of the skull through the vertex without intravenous contrast. RADIATION DOSE REDUCTION: This exam was performed according to the departmental dose-optimization program which includes automated exposure control, adjustment of the mA and/or kV according to patient size and/or use of iterative reconstruction technique. COMPARISON:  None. FINDINGS: Mildly motion limited study.  Within this limitation: Brain: No evidence of acute infarction, hemorrhage, hydrocephalus, extra-axial collection or mass lesion/mass effect. Vascular: No hyperdense vessel identified. Skull: No acute fracture. Sinuses/Orbits: Bilateral inferior maxillary sinus mucosal thickening. Unremarkable orbits. Other: No mastoid effusions. ASPECTS [facility] Stroke Program Early CT Score) total score (0-10 with 10 being normal): 10. IMPRESSION: No evidence of acute intracranial abnormality on this  mildly motion limited study. Code stroke imaging results were communicated on 09/20/2021 at 11:57 am to provider Dr. Cheral Marker via secure text paging. Electronically Signed: By: Margaretha Sheffield M.D. On: 09/20/2021 11:58   [place] EKG SITE RITE  Result Date: 09/01/2021 If [facility] image not attached, placement could not be confirmed due to current cardiac rhythm.  CT ANGIO HEAD NECK W WO CM W PERF (CODE STROKE)  Result Date: 09/20/2021 CLINICAL DATA:  Neuro deficit, acute, stroke suspected EXAM: CT ANGIOGRAPHY HEAD AND NECK CT PERFUSION BRAIN TECHNIQUE: Multidetector CT imaging of the head and neck was performed using the standard protocol during bolus administration of intravenous contrast. Multiplanar CT image reconstructions and MIPs were obtained to evaluate the vascular anatomy. Carotid stenosis measurements (when applicable) are obtained utilizing NASCET criteria, using the distal internal carotid diameter as the denominator. Multiphase CT imaging of the brain was performed following IV bolus contrast injection. Subsequent parametric perfusion maps  were calculated using RAPID software. RADIATION DOSE REDUCTION: This exam was performed according to the departmental dose-optimization program which includes automated exposure control, adjustment of the mA and/or kV according to patient size and/or use of iterative reconstruction technique. CONTRAST:  169mL OMNIPAQUE IOHEXOL 350 MG/ML SOLN COMPARISON:  None. FINDINGS: CTA NECK FINDINGS Severely motion limited study. The visualized common carotid arteries and internal carotid arteries appear to be grossly patent. Nondiagnostic evaluation of the vertebral arteries. Also, nondiagnostic evaluation for stenosis. Review of the MIP images confirms the above findings CTA HEAD FINDINGS Severely limited study due to motion and venous timing. Within this limitation: Anterior circulation: The visible intracranial ICAs appear grossly patent. Bilateral M1 MCAs, A1 ACAs, and  proximal A2 ACAs appear to be grossly patent. Evaluation of the more distal vessels is severely limited, including M2 MCAs and distal ACAs with suspected right superior M2 MCA occlusion or severe stenosis. Posterior circulation: Essentially nondiagnostic evaluation of the vertebral arteries due to motion and streak. The visible basilar artery and proximal posterior cerebral arteries appear to be grossly patent. Nondiagnostic evaluation of the distal PCAs due to motion. Venous sinuses: As permitted by contrast timing, patent. Review of the MIP images confirms the above findings CT Brain Perfusion Findings: ASPECTS: 10 CBF (<30%) Volume: 78mL Perfusion (Tmax>6.0s) volume: 81mL Mismatch Volume: 34mL IMPRESSION: CT perfusion: Motion limited with RAPID reporting areas of penumbra in the right anterior MCA territory and the left posterior MCA territory. Overall penumbra is estimated to be 17 mL. No core infarct identified. CTA head: Severely limited study due to motion and venous timing. M1 MCAs patent with suspected right superior M2 MCA occlusion or severe stenosis in the region of right anterior MCA territory penumbra described above. CTA neck: Severely motion limited study. The visualized common carotid arteries and internal carotid arteries appear to be grossly patent. Nondiagnostic evaluation of the vertebral arteries. Also, nondiagnostic evaluation for stenosis. Preliminary findings discussed with Dr. Cheral Marker via telephone at 12:12 p.m. Perfusion findings discussed at 12:45 p.m. Electronically Signed   By: Margaretha Sheffield M.D.   On: 09/20/2021 12:52       HISTORY OF PRESENT ILLNESS Mr. LEJEND DICECCO is a 26 y.o. male with history of CHF, cardiogenic shock, DVT on Eliquis, cocaine use presenting after going to sleep at 2200 on 2/20.  Family states that at 4 AM they heard him fall and got back into bed, they saw him again at 8 AM and he was slurring words, had left-sided weakness, difficulty walking and was  acting "strange".  Initial NIH SS 24.  Decreased level consciousness, disoriented, not following commands, right gaze preference, left hemianopsia, left facial droop, left arm weakness, left leg weakness, decreased sensation on the left, global aphasia, sensory neglect.  Not a candidate for TNK due to Eliquis, taken to IR emergently. Extubated 09/22/2021.  Moved to progressive unit, therapy recommends CIR. Patient and significant other refuse CIR placement. Patient/family requested discharge home.   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 Small vessel disease. Atrophy. ASPECTS 10.    CTA head & neck M1 MCAs patent with suspected right superior M2 MCA occlusion or severe stenosis in the region of right anterior MCA territory penumbra CT perfusion penumbra in the right anterior MCA territory and the left posterior MCA territory. Overall penumbra is estimated to be 17 mL. No core infarct  identified Post IR CT No ICH MRI large area of restricted diffusion in the right MCA territory, moderate to large acute infarct in the left temporal lobe  2D Echo 09/02/2021-EF less than 20%, LV global hypokinesis, severe tricuspid regurgitation LDL 45 HgbA1c 4.5 VTE prophylaxis -Lovenox Eliquis (apixaban) daily prior to admission, now on aspirin 325 mg daily.  May consider resume AC on 09/25/21 to avoid risk of HT Therapy recommendations:  CIR- patient refused, d/c home Disposition: Pending   Congestive heart failure with reduced EF Home medications: Digoxin, Jardiance, Entresto, Lasix, spironolactone Now resumed home meds Last echo 2/3-EF < 20% Recently admitted for heart failure from 2/2 - 2/8 under Dr. 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 1/7-08/08/2021- admitted to [facility] with LE swelling and dyspnea- RLE DVT and  b/l pulm emboli Was discharged on Eliquis Wife is unsure if he was taking his Eliquis appropriately, he did report noticing blood in his stool recently and had discussed stopping Eliquis during last hospitalization   Respiratory failure, hypoxic Intubated for procedure and airway protection Remain intubated after procedure due to mental status Extubated on 2/23, tolerating well CCM on board   Hypotension BP goal normotensive Off Norepinephrine, improving BP fluctuates but neuro stable   Cocaine abuse UDS positive for cocaine Cessation education will be provided   Tobacco abuse Current smoker Smoking cessation counseling will be provided   Other Stroke Risk Factors Advanced Age >/= 65  ETOH use, alcohol level <10, advised to drink no more than 2 drink(s) a day THC abuse - UDS:  THC POSITIVE. Patient will be advised to stop using due to stroke risk.    DISCHARGE EXAM Blood pressure 98/62, pulse 88, temperature 97.7 F (36.5 C), temperature source Axillary, resp. rate 20, height 5\' 11"  (1.803 m), weight 93.9 kg, SpO2 100 %. General - Well nourished, well developed   Ophthalmologic - fundi not visualized due to noncooperation.   Cardiovascular - Regular rhythm and rate.   Neuro - Awake, alert, walking around the room unassisted. Perseverates on single thoughts verbally and written, is able to conversate with wife. He will answer her questions but did not respond equally when we asked questions. He seems to understand better with written text. Receptive aphasia, no dysarthria. No significant nasolabial fold flattening. Tongue protrusion not cooperative. Resistant to exam but non focal. He expressed to the nurse and his wife that he wanted to do down to the cafeteria to eat and refused the hospital tray.  DISCHARGE PLAN Xarelto (rivaroxaban) daily for secondary stroke prevention. Initially given 10mg  but there is no coupon for this done. Given h/o PE 20mg  daily is recommended.   Recommend ongoing stroke risk factor control by Primary Care Physician at time of discharge from inpatient rehabilitation. Follow-up PCP Pcp, No in 2 weeks following discharge from rehab. Follow-up in [facility] in 8 weeks following discharge from rehab, office to schedule an appointment.   30 minutes were spent preparing discharge.  Patient seen and examined by NP/APP with MD. MD to update note as needed.   Janine Ores, DNP, FNP-BC [facility] Pager: [phone]   ATTENDING ATTESTATION:  Looks very healthy on exam today. Ambulating in hallway ready to go down to the cafeteria as he does not like the standard food.  He and his wife do not want inpt rehab. He will no participate. Will discharge today with oupt followup.Discussed with entire stroke team on [address].  Dr. Reeves Forth evaluated pt independently, reviewed imaging, chart, labs. Discussed and formulated plan with the APP. Please see APP note above for details.   Total 36 minutes spent on counseling patient and coordinating care, writing notes and reviewing chart.  Rigdon Macomber,MD

## 2021-09-24 NOTE — TOC Transition Note (Signed)
Transition of Care [facility]) - CM/SW Discharge Note   Patient Details  Name: Carlos Mckinney MRN: [id number] Date of Birth: Feb 13, 1996  Transition of Care [facility]) CM/SW Contact:  Lawerance Sabal, RN Phone Number: 09/24/2021, 1:45 PM   Clinical Narrative:    Patient refusing COR, wants to go home.  Patient is uninsured, referral sent to charity [facility] provider [place], who will review. Added barrier to [facility] services being accepted is patient testing positive for cocaine and THC on 2/21.  Was Dc'd 2/3 with MATCH and meds to get set up through HF fund per HF inpatient CM Alesia. Patient had follow up on 2/17 scheduled at [facility] which he was a no show for. Patient can call and reschedule.  Will send patient home with any applicable coupon cards    Final next level of care: Home w Home Health Services Barriers to Discharge: No Barriers Identified   Patient Goals and CMS Choice Patient states their goals for this hospitalization and ongoing recovery are:: to go home      Discharge Placement                       Discharge Plan and Services                DME Arranged: N/A         HH Arranged: PT, OT, Speech Therapy HH Agency: [facility] (charity provider) Date HH Agency Contacted: 09/24/21 Time HH Agency Contacted: 1344 Representative spoke with at [facility] Agency: Amy  Social Determinants of Health (SDOH) Interventions     Readmission Risk Interventions No flowsheet data found.

## 2021-09-25 ENCOUNTER — Telehealth: Payer: Self-pay

## 2021-09-25 NOTE — Telephone Encounter (Signed)
Received notification that spouse could not fill Xaralto 10mg . 30 day free card does not apply to this dose. Spoke w [facility], and Dr [facility] who will send prescription for 20 mg Viviann Spare to [facility] on [address] and [address].  Notified spouse that new prescription will be available for her to pick up. Also texted another 30 day Xaralto coupon to her.

## 2021-09-26 ENCOUNTER — Telehealth (HOSPITAL_COMMUNITY): Payer: Self-pay | Admitting: *Deleted

## 2021-09-26 ENCOUNTER — Telehealth (HOSPITAL_COMMUNITY): Payer: Self-pay | Admitting: Pharmacist

## 2021-09-26 ENCOUNTER — Other Ambulatory Visit (HOSPITAL_COMMUNITY): Payer: Self-pay

## 2021-09-26 ENCOUNTER — Telehealth (HOSPITAL_COMMUNITY): Payer: Self-pay | Admitting: Pharmacy Technician

## 2021-09-26 MED ORDER — ENTRESTO 49-51 MG PO TABS: 1.0000 | ORAL_TABLET | Freq: Two times a day (BID) | ORAL | 3 refills | Status: DC

## 2021-09-26 NOTE — Telephone Encounter (Signed)
Pts spouse Danelle Earthly left vm stating pt was recently discharged from the hospital. Pt was admitted for a stroke. Pt was asked to get a hospital f/u with our clinic. She also inquired about entresto stating it was supposed to be mailed per the outpatient pharmacy. I routed vm to [place] to check on entresto. I called pts spouse back to schedule hosp. F/u no answer/ left vm for return call.

## 2021-09-26 NOTE — Telephone Encounter (Addendum)
Sent updated Entresto prescription to [facility]. Patient obtains through [employer] PAP.    Sent updated patient application to [facility] with updated caregiver information. This is needed so his wife Silvano Bilis may speak with them to reorder his medication.   Karle Plumber, PharmD, BCPS, BCCP, CPP Heart Failure Clinic Pharmacist [phone]

## 2021-09-26 NOTE — Telephone Encounter (Signed)
Advanced Heart Failure Patient Advocate Encounter  Patient's wife called requesting help with where to refill Entresto. Provided phone number to [facility], [phone]. Requested a bottle of samples since the patient is completely out.   Archer Asa, CPhT

## 2021-09-26 NOTE — Telephone Encounter (Signed)
Medication Samples have been provided to the patient.   Drug name: Delene Loll     Strength: 49/51 mg      Qty: 2 bottles                 LOT: [id number] Exp.Date: 08/2023     The patient has been instructed regarding the correct time, dose, and frequency of taking this medication, including desired effects and most common side effects.   Audry Riles, PharmD, BCPS, CPP Heart Failure Clinic Pharmacist [phone]

## 2021-09-27 ENCOUNTER — Other Ambulatory Visit: Payer: Self-pay

## 2021-09-27 ENCOUNTER — Ambulatory Visit (INDEPENDENT_AMBULATORY_CARE_PROVIDER_SITE_OTHER): Payer: Self-pay | Admitting: Nurse Practitioner

## 2021-09-27 ENCOUNTER — Encounter: Payer: Self-pay | Admitting: Nurse Practitioner

## 2021-09-27 VITALS — BP 109/68 | HR 96 | Temp 97.9°F | Ht 71.0 in | Wt 197.0 lb

## 2021-09-27 DIAGNOSIS — F802 Mixed receptive-expressive language disorder: Secondary | ICD-10-CM

## 2021-09-27 DIAGNOSIS — Z0000 Encounter for general adult medical examination without abnormal findings: Secondary | ICD-10-CM

## 2021-09-27 DIAGNOSIS — I63413 Cerebral infarction due to embolism of bilateral middle cerebral arteries: Secondary | ICD-10-CM

## 2021-09-27 NOTE — Patient Instructions (Signed)
You were seen today in the [facility] for to establish care and for hospital follow up. Labs were collected, results will be available via MyChart or, if abnormal, you will be contacted by clinic staff. You were prescribed medications, please take as directed. Please follow up in 3 mths for reevaluation of symptoms and PT/OT.

## 2021-09-27 NOTE — Progress Notes (Signed)
° ° [facility] °[address] °[place], [place]  [address] °Phone:  [phone]   Fax:  [phone] °Subjective:  ° Patient ID: Carlos Mckinney, male    DOB: 09/14/1995, 26 y.o.   MRN: [id number] ° °Chief Complaint  °Patient presents with  ° Follow-up  °  Pt is here to establish care. Pt stated he had a stoke a week ago   ° °HPI °Carlos Mckinney 26 y.o. male  has a past medical history of Heart failure (HCC), Hypertension, and Polysubstance abuse (HCC). To the [facility] to establish care and for hospital follow up.  ° °Patient states that he went to the ED 1 wk go after falling out of bed and being unable to move and facial drooping. Went to the ED via EMS and was diagnosed with CVA x 2. Residual effects of CVA, delayed processing of information and subtle changes in speech. Patient has history of DVT x 1 mth, was prescribed Eliquis, but stopped taking medication due to rectal bleeding. Hospitalist suspects not taking the Eliquis was the potential cause of stroke. Since being discharged from the hospital, was prescribed Xarelto, which he has been compliant in taking.  ° °Partner at bedside states that patient has difficulty responding to verbal cues, she often has to write words down for him to be able to comprehend. Denies any drug usage before or after stroke. Currently compliant with all medications. Patient has been unemployed since returning home from incarceration 03/2021. Was informed by cardiologist that he would be unable to work due to heart failure. Denies any other complaints today. Patient currently resides with girlfriend and 4 children. ° °Limited HPI and ROS due to residual effects of stroke. Family at bedside provided information.  ° °Past Medical History:  °Diagnosis Date  ° Heart failure (HCC)   ° Hypertension   ° Polysubstance abuse (HCC)   ° ° °Past Surgical History:  °Procedure Laterality Date  ° IR CT HEAD LTD  09/22/2021  ° IR PERCUTANEOUS ART THROMBECTOMY/INFUSION  INTRACRANIAL INC DIAG ANGIO  09/20/2021  ° RADIOLOGY WITH ANESTHESIA N/A 09/20/2021  ° Procedure: IR WITH ANESTHESIA;  Surgeon: Radiologist, Medication, MD;  Location: [facility];  Service: Radiology;  Laterality: N/A;  ° ° °Family History  °Problem Relation Age of Onset  ° Hypertension Mother   ° ° °Social History  ° °Socioeconomic History  ° Marital status: Married  °  Spouse name: Not on file  ° Number of children: Not on file  ° Years of education: Not on file  ° Highest education level: Not on file  °Occupational History  ° Not on file  °Tobacco Use  ° Smoking status: Former  °  Types: Cigarettes  ° Smokeless tobacco: Never  °Substance and Sexual Activity  ° Alcohol use: No  ° Drug use: No  ° Sexual activity: Not on file  °Other Topics Concern  ° Not on file  °Social History Narrative  ° Not on file  ° °Social Determinants of Health  ° °Financial Resource Strain: High Risk  ° Difficulty of Paying Living Expenses: Hard  °Food Insecurity: No Food Insecurity  ° Worried About Running Out of Food in the Last Year: Never true  ° Ran Out of Food in the Last Year: Never true  °Transportation Needs: No Transportation Needs  ° Lack of Transportation (Medical): No  ° Lack of Transportation (Non-Medical): No  °Physical Activity: Not on file  °Stress: Not on file  °Social Connections: Not   on file  °Intimate Partner Violence: Not on file  ° ° °Outpatient Medications Prior to Visit  °Medication Sig Dispense Refill  ° albuterol (VENTOLIN HFA) 108 (90 Base) MCG/ACT inhaler Inhale 1-2 puffs into the lungs every 6 (six) hours as needed for wheezing or shortness of breath.    ° digoxin (LANOXIN) 0.125 MG tablet Take 1 tablet (0.125 mg total) by mouth daily. 30 tablet 6  ° empagliflozin (JARDIANCE) 10 MG TABS tablet Take 1 tablet (10 mg total) by mouth daily. 30 tablet 6  ° furosemide (LASIX) 40 MG tablet Take 1 tablet (40 mg total) by mouth daily. 30 tablet 3  ° phenylephrine-shark liver oil-mineral oil-petrolatum (PREPARATION H)  0.25-14-74.9 % rectal ointment Place 1 application rectally 2 (two) times daily as needed for hemorrhoids. 25 g 1  ° potassium chloride (KLOR-CON) 20 MEQ packet Use 1 packet (20 mEq) by mouth daily. 30 packet 6  ° rivaroxaban (XARELTO) 10 MG TABS tablet Take 1 tablet (10 mg total) by mouth daily. 30 tablet 1  ° sacubitril-valsartan (ENTRESTO) 49-51 MG Take 1 tablet by mouth 2 (two) times daily. 180 tablet 3  ° spironolactone (ALDACTONE) 25 MG tablet Take 1 tablet (25 mg total) by mouth daily. 30 tablet 6  ° hydrocortisone (ANUSOL-HC) 25 MG suppository Place 1 suppository (25 mg total) rectally 2 (two) times daily. (Patient not taking: Reported on 09/21/2021) 12 suppository 0  ° polyethylene glycol (MIRALAX / GLYCOLAX) 17 g packet Take 17 g by mouth daily. (Patient not taking: Reported on 09/27/2021) 30 each 4  ° °No facility-administered medications prior to visit.  ° ° °No Known Allergies ° °Review of Systems  °Constitutional:  Negative for chills, fever and malaise/fatigue.  °Respiratory:  Negative for cough and shortness of breath.   °Cardiovascular:  Negative for chest pain, palpitations and leg swelling.  °Gastrointestinal:  Negative for abdominal pain, blood in stool, constipation, diarrhea, nausea and vomiting.  °Genitourinary: Negative.   °Musculoskeletal: Negative.   °Skin: Negative.   °Neurological:  Positive for speech change.  °Psychiatric/Behavioral:  Negative for depression. The patient is not nervous/anxious.   °All other systems reviewed and are negative. ° °   °Objective:  °  °Physical Exam °Constitutional:   °   General: He is not in acute distress. °   Appearance: Normal appearance. He is normal weight.  °HENT:  °   Head: Normocephalic.  °   Right Ear: Tympanic membrane, ear canal and external ear normal. There is no impacted cerumen.  °   Left Ear: Tympanic membrane, ear canal and external ear normal. There is no impacted cerumen.  °   Nose: Nose normal. No congestion or rhinorrhea.  °    Mouth/Throat:  °   Mouth: Mucous membranes are moist.  °   Pharynx: Oropharynx is clear. No oropharyngeal exudate or posterior oropharyngeal erythema.  °Eyes:  °   General: No scleral icterus.    °   Right eye: No discharge.     °   Left eye: No discharge.  °   Extraocular Movements: Extraocular movements intact.  °   Conjunctiva/sclera: Conjunctivae normal.  °   Pupils: Pupils are equal, round, and reactive to light.  °Neck:  °   Vascular: No carotid bruit.  °Cardiovascular:  °   Rate and Rhythm: Normal rate and regular rhythm.  °   Pulses: Normal pulses.  °   Heart sounds: Normal heart sounds.  °   Comments: No obvious peripheral edema °Pulmonary:  °     Effort: Pulmonary effort is normal.  °   Breath sounds: Normal breath sounds.  °Abdominal:  °   General: Abdomen is flat. Bowel sounds are normal. There is no distension.  °   Palpations: Abdomen is soft. There is no mass.  °   Tenderness: There is no abdominal tenderness. There is no right CVA tenderness, left CVA tenderness, guarding or rebound.  °   Hernia: No hernia is present.  °Musculoskeletal:     °   General: No swelling, tenderness, deformity or signs of injury. Normal range of motion.  °   Cervical back: Normal range of motion and neck supple. No rigidity or tenderness.  °   Right lower leg: No edema.  °   Left lower leg: No edema.  °Lymphadenopathy:  °   Cervical: No cervical adenopathy.  °Skin: °   General: Skin is warm and dry.  °   Capillary Refill: Capillary refill takes less than 2 seconds.  °Neurological:  °   General: No focal deficit present.  °   Mental Status: He is alert.  °   Comments: Patient has difficulty interpretation verbal communication, in addition to delayed speech and comprehension with written information. Patient oriented x 3.  °Psychiatric:     °   Mood and Affect: Mood normal.     °   Behavior: Behavior normal.     °   Thought Content: Thought content normal.     °   Judgment: Judgment normal.  ° ° °BP 109/68    Pulse 96    Temp  97.9 °F (36.6 °C)    Ht 5' 11" (1.803 m)    Wt 197 lb 0.2 oz (89.4 kg)    SpO2 100%    BMI 27.48 kg/m²  °Wt Readings from Last 3 Encounters:  °09/27/21 197 lb 0.2 oz (89.4 kg)  °09/20/21 207 lb 0.2 oz (93.9 kg)  °09/14/21 203 lb 9.6 oz (92.4 kg)  ° ° °Immunization History  °Administered Date(s) Administered  ° DTaP 12/04/1994, 02/12/1995, 04/26/1995  ° HPV Quadrivalent 06/29/2010  ° Hepatitis B 10/08/1994, 12/04/1994, 04/26/1995  ° HiB (PRP-OMP) 12/04/1994, 02/12/1995, 04/26/1995  ° IPV 12/04/1994, 02/12/1995, 04/26/1995  ° Influenza-Unspecified 06/29/2010  ° Janssen (J&J) SARS-COV-2 Vaccination 10/29/2019  ° MMR 09/27/1995  ° ° °Diabetic Foot Exam - Simple   °No data filed °  ° ° °No results found for: TSH °Lab Results  °Component Value Date  ° WBC 6.4 09/24/2021  ° HGB 10.5 (L) 09/24/2021  ° HCT 33.9 (L) 09/24/2021  ° MCV 89.2 09/24/2021  ° PLT 320 09/24/2021  ° °Lab Results  °Component Value Date  ° NA 140 09/24/2021  ° K 3.4 (L) 09/24/2021  ° CO2 23 09/24/2021  ° GLUCOSE 85 09/24/2021  ° BUN <5 (L) 09/24/2021  ° CREATININE 0.94 09/24/2021  ° BILITOT 1.3 (H) 09/21/2021  ° BILITOT 1.2 09/21/2021  ° ALKPHOS 61 09/21/2021  ° AST 18 09/21/2021  ° ALT 12 09/21/2021  ° PROT 6.1 (L) 09/21/2021  ° ALBUMIN 2.6 (L) 09/21/2021  ° CALCIUM 8.6 (L) 09/24/2021  ° ANIONGAP 9 09/24/2021  ° °Lab Results  °Component Value Date  ° CHOL 87 09/21/2021  ° °Lab Results  °Component Value Date  ° HDL 26 (L) 09/21/2021  ° °Lab Results  °Component Value Date  ° LDLCALC 45 09/21/2021  ° °Lab Results  °Component Value Date  ° TRIG 80 09/21/2021  ° °Lab Results  °Component Value Date  ° CHOLHDL 3.3   09/21/2021  ° °Lab Results  °Component Value Date  ° HGBA1C 4.7 (L) 09/21/2021  ° HGBA1C 4.5 (L) 09/04/2021  ° ° °   °Assessment & Plan:  ° °Problem List Items Addressed This Visit   ° °  ° Cardiovascular and Mediastinum  ° CVA (cerebral vascular accident) (HCC)  ° °Other Visit Diagnoses   ° ° Encounter for wellness examination in adult    -   Primary °Chart review indicates completion of labs 1 wk ago, results significant for positive UDS °Chart review indicates that lab abnormalities were addressed by specialist °Discussed with patient the risk factors related to drug usage and smoking  °Encouraged continued medication compliance  °Encouraged patient to maintain follow up with specialist and PT/OT  ° Wernicke's aphasia      ° Relevant Orders  ° Ambulatory referral to Speech Therapy  ° °Follow up in 3 mths for reevaluation of symptoms, sooner as needed  ° ° °I am having Elonzo J. Hornbrook maintain his albuterol, digoxin, furosemide, spironolactone, empagliflozin, potassium chloride, hydrocortisone, phenylephrine-shark liver oil-mineral oil-petrolatum, polyethylene glycol, rivaroxaban, and Entresto. ° °No orders of the defined types were placed in this encounter. ° ° ° ° I , NP °  °

## 2021-09-28 ENCOUNTER — Other Ambulatory Visit: Payer: Self-pay | Admitting: Nurse Practitioner

## 2021-09-28 DIAGNOSIS — I63413 Cerebral infarction due to embolism of bilateral middle cerebral arteries: Secondary | ICD-10-CM

## 2021-09-29 ENCOUNTER — Other Ambulatory Visit: Payer: Self-pay

## 2021-09-29 ENCOUNTER — Encounter: Payer: Self-pay | Admitting: Occupational Therapy

## 2021-09-29 ENCOUNTER — Ambulatory Visit: Payer: Medicaid Other | Attending: Nurse Practitioner | Admitting: Occupational Therapy

## 2021-09-29 ENCOUNTER — Ambulatory Visit: Payer: Medicaid Other | Admitting: Speech Pathology

## 2021-09-29 ENCOUNTER — Encounter: Payer: Self-pay | Admitting: Speech Pathology

## 2021-09-29 DIAGNOSIS — M6281 Muscle weakness (generalized): Secondary | ICD-10-CM

## 2021-09-29 DIAGNOSIS — F802 Mixed receptive-expressive language disorder: Secondary | ICD-10-CM | POA: Diagnosis not present

## 2021-09-29 DIAGNOSIS — R41841 Cognitive communication deficit: Secondary | ICD-10-CM | POA: Insufficient documentation

## 2021-09-29 NOTE — Therapy (Signed)
[facility] [address] [place], [place], [address] Phone: [phone]   Fax:  [phone]  Speech Language Pathology Evaluation  Patient Details  Name: Carlos Mckinney MRN: [id number] Date of Birth: [birth year] Referring Provider (SLP): Orion Crook, NP   Encounter Date: 09/29/2021   End of Session - 09/29/21 1035     Visit Number 1    Number of Visits 13    Date for SLP Re-Evaluation 11/25/21    Authorization Type medicare pending -- requested initial 3 visits, will request 9 more following auth    SLP Start Time 0945    SLP Stop Time  1033    SLP Time Calculation (min) 48 min    Activity Tolerance Patient tolerated treatment well             Past Medical History:  Diagnosis Date   Heart failure (HCC)    Hypertension    Polysubstance abuse (HCC)     Past Surgical History:  Procedure Laterality Date   IR CT HEAD LTD  09/22/2021   IR PERCUTANEOUS ART THROMBECTOMY/INFUSION INTRACRANIAL INC DIAG ANGIO  09/20/2021   RADIOLOGY WITH ANESTHESIA N/A 09/20/2021   Procedure: IR WITH ANESTHESIA;  Surgeon: Radiologist, Medication, MD;  Location: [facility] OR;  Service: Radiology;  Laterality: N/A;    There were no vitals filed for this visit.   Subjective Assessment - 09/29/21 0949     Subjective "I'm hungry"    Patient is accompained by: Family member    Currently in Pain? 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 CIR recommended but patient and SO declined, requested that pt d/c home. Seen as IP for swallow evaluation, put on  Dysphagia III with thin lqiuids diet. Attempt to eval for cognitive communication deficits, declined standardized assessment with SLP. PMH: advanced CHF, tobacco abuse, cocaine abuse, obesity, DVT.    Mobility Status ambulates without assistance      Balance Screen   Has the patient fallen in the past 6 months Yes   at time of acute event   How many times? 1    Has the patient had a decrease in activity level because of a fear of falling?  No    Is the patient reluctant to leave their home because of a fear of falling?  No      Prior Functional Status   Cognitive/Linguistic Baseline Within functional limits    Type of Home House     Lives With Family    Available Support Family    Vocation Unemployed      Cognition   Overall Cognitive Status Impaired/Different from baseline    Area of Impairment Attention;Memory;Following commands;Problem solving    Current Attention Level Focused    Attention Comments attention waxing and waning throughout evaluation, requires tactile cues to orient to stimuli    Memory Decreased short-term memory    Memory Comments reports "sometimes I forget."    Following Commands Follows one step commands inconsistently  Following Command Comments impacted by impaired auditory procressing; impairment still observed with written cues.    Problem Solving Slow processing    Problem Solving Comments unable to formally assess 2/2 language impairment, however suspect that novel problems would be impacted by pt's impaired processing, attention, and memory.    Behaviors Perseveration;Restless      Auditory Comprehension   Overall Auditory Comprehension Impaired    Yes/No Questions Impaired    Basic Biographical Questions 0-25% accurate    Basic Immediate Environment Questions 0-24% accurate    Complex Questions 0-24% accurate    Paragraph Comprehension (via yes/no questions) Not tested    Commands Impaired    One Step Basic Commands 0-24% accurate   even with max  A   Conversation Simple    Other Conversation Comments requires repetition, simplification, tactile and written cues for minimal engagement in conversation    EffectiveTechniques Extra processing time;Repetition;Visual/Gestural cues   written supports     Visual Recognition/Discrimination   Discrimination Within Function Limits      Reading Comprehension   Reading Status Within funtional limits      Expression   Primary Mode of Expression Verbal      Verbal Expression   Overall Verbal Expression Impaired    Initiation No impairment    Automatic Speech Counting    Level of Generative/Spontaneous Verbalization Phrase    Repetition No impairment    Naming No impairment    Pragmatics Impairment    Impairments Topic appropriateness;Topic maintenance;Turn Taking    Interfering Components Attention    Effective Techniques Written cues    Other Verbal Expression Comments spontenous speech observed to be telegraphic in nature      Oral Motor/Sensory Function   Overall Oral Motor/Sensory Function Appears within functional limits for tasks assessed      Motor Speech   Overall Motor Speech Appears within functional limits for tasks assessed    Respiration Within functional limits    Phonation Normal    Resonance Within functional limits    Articulation Within functional limitis    Intelligibility Intelligible      Standardized Assessments   Standardized Assessments  Other Assessment   QAB   Other Assessment QAB - 3.61 - severe aphasia      Individuals Consulted   Consulted and Agree with Results and Recommendations Patient;Family member/caregiver    Family Member Anabel Halon                             SLP Education - 09/29/21 [phone]     Education Details evaluation results, POC, likely goals, use of written supports in conversation    Person(s) Educated Patient;Spouse    Methods Explanation;Demonstration;Handout    Comprehension Verbalized  understanding;Need further instruction              SLP Short Term Goals - 09/29/21 1130       SLP SHORT TERM GOAL #1   Title Pt will demsontrate auditory comprehension at word level with no more than 1 repetition with min A, 80% trials, across 2 sessions    Baseline 0/6 word level QAB when presented verbally    Time 4    Period Weeks    Status New      SLP SHORT TERM GOAL #2   Title Spouse will teach back x2 novel communication supports to A pt in auditory comprehension at conversational level    Baseline using written  supports exclusively    Time 4    Period Weeks    Status New      SLP SHORT TERM GOAL #3   Title Pt/SO will report successful implementation of x2 attention/memory compensations/supports at home with mod I across 2 sessions    Baseline not using compensations at home    Time 4    Period Weeks    Status New      SLP SHORT TERM GOAL #4   Title Pt will accurately sort medications into pill box based on label instructions with occasional mod A    Baseline emerging skill reported at home, still mostly managed by care partner    Time 4    Period Weeks    Status New              SLP Long Term Goals - 09/29/21 1142       SLP LONG TERM GOAL #1   Title Pt will demsontrate auditory comprehension at phrase level with no more than 1 repetition with min A, 80% trials, across 2 sessions    Baseline impaired at word level    Time 8    Period Weeks    Status New      SLP LONG TERM GOAL #2   Title Spouse will report successful implementation of 2 types of communication supports to A pt in auditory comprehension at conversational level across 1 week period    Baseline using written supports exclusively    Time 8    Period Weeks    Status New      SLP LONG TERM GOAL #3   Title Pt/SO will report successful implementation of x4 attention/memory compensations/supports at home with mod I across 1 week period    Baseline not using compensations at this time     Time 8    Period Weeks    Status New      SLP LONG TERM GOAL #4   Title Pt will accurately manage medications with no missed doses reported > 2 weeks given rare min A from family/care partners/ caregiver    Baseline managed by care partner    Time 8    Period Weeks    Status New              Plan - 09/29/21 1036     Clinical Impression Statement Mr. Georganna Skeans presents for OP ST evaluation, on referral from NP Orion Crook d/t Wernicke's Aphasia. Pt presents with severely impacted receptive language, and cognitive communication deficits in areas of attention and memory. SLP administers QAB in which pt scores 3.61, indicating severe aphasia. Pt score impacted by impaired auditory comprehension and cogntition. Written cues are effective in improving pt's receptive understanding throughout assessment but do not completely remedy receptive language impairment. SLP required to use visual cues of written words throughout administration of QAB to support pt participation. Pt conversational speech observed to be perseveratory on unrelated topics, telegraphic at times (e.g. "thanksgiving... family... desserts" IRT what's you're favorite holiday), has difficulty with topic mx even in simple coversation with visual supports. Pt's cognition not formally assessed this date, however through informal observation, pt has impairments in atention, awareness, memory. Pt observed to requires tactile cues to orient to verbal and visual stimuli throughout assessment, perseverates on hunger or previously asked questions, asks numerous times for repetition. Pt expresses strong desire to return to work, currently unable to d/t language and cognitive impairments. Pt is beginning to participate more fully in  ADL such as cooking, cleaning, medication management, schedule management, financial management. Requires supervision at this time for all ADLs. Recommend skilled ST services to address receptive language  impairment (F80.2) and cognitive communication deficits (R41.841).    Speech Therapy Frequency 2x / week    Duration 8 weeks   to account for scheduling   Treatment/Interventions Environmental controls;Cueing hierarchy;SLP instruction and feedback;Compensatory techniques;Cognitive reorganization;Functional tasks;Compensatory strategies;Internal/external aids;Multimodal communcation approach;Patient/family education    Potential to Achieve Goals Fair    Potential Considerations Severity of impairments    Consulted and Agree with Plan of Care Patient;Family member/caregiver    Family Member Consulted SO, Moesha             Patient will benefit from skilled therapeutic intervention in order to improve the following deficits and impairments:   Cognitive communication deficit  Receptive language impairment  Managed medicaid CPT codes: 36629 - SLP treatment, [phone] - Cognitive training (First 15 min), and 97130 - Cognitive training (each additional 15 min)   Problem List Patient Active Problem List   Diagnosis Date Noted   Acute ischemic stroke (HCC) 09/20/2021   Middle cerebral artery embolism, right 09/20/2021   CVA (cerebral vascular accident) (HCC) 09/20/2021   Acute on chronic diastolic congestive heart failure, NYHA class 3 (HCC) 09/01/2021   Iron deficiency anemia 08/07/2021   Multiple pulmonary emboli (HCC) 08/06/2021   Acute on chronic HFrEF (heart failure with reduced ejection fraction) (HCC) 08/06/2021   Acute deep vein thrombosis (DVT) of right lower extremity (HCC) 08/06/2021   Anemia 08/06/2021    Maia Breslow, CF-SLP 09/29/2021, 12:16 PM  [facility] [address] [place], [place], [address] Phone: [phone]   Fax:  [phone]  Name: JYRON TURMAN MRN: [id number] Date of Birth: Dec 23, 1995

## 2021-09-29 NOTE — Therapy (Addendum)
[place] [facility] [facility], [place], [address] ?Phone: [phone]   Fax:  [phone] ? ?Occupational Therapy Evaluation/One Time Visit ? ?Patient Details  ?Name: Carlos Mckinney ?MRN: [place]:[id number] ?Date of Birth: 08-07-1995 ?Referring Provider (OT): Bo Merino NP ? ? ?Encounter Date: 09/29/2021 ? ? OT End of Session - 09/29/21 1001   ? ? Visit Number 1   ? Authorization Type Medicaid Family Planning   ? Authorization Time Period [facility] not covered   ? OT Start Time [phone]   ? OT Stop Time [id number]   ? OT Time Calculation (min) 27 min   ? Activity Tolerance Patient tolerated treatment well;Other (comment)   global aphasia  ? Behavior During Therapy [facility] for tasks assessed/performed   ? ?  ?  ? ?  ? ? ?Past Medical History:  ?Diagnosis Date  ? Heart failure ([place])   ? Hypertension   ? Polysubstance abuse ([place])   ? ? ?Past Surgical History:  ?Procedure Laterality Date  ? IR CT HEAD LTD  09/22/2021  ? IR PERCUTANEOUS ART THROMBECTOMY/INFUSION INTRACRANIAL INC DIAG ANGIO  09/20/2021  ? RADIOLOGY WITH ANESTHESIA N/A 09/20/2021  ? Procedure: IR WITH ANESTHESIA;  Surgeon: Radiologist, Medication, MD;  Location: [place];  Service: Radiology;  Laterality: N/A;  ? ? ?There were no vitals filed for this visit. ? ? Subjective Assessment - 09/29/21 0958   ? ? Subjective  Pt is a 26 year old male that presents to Neuro OPOT with bilateral embolism MCA on 09/20/21 that was seen in acute setting noted to have R gaze preference, LUE and LE weakness, and global aphasia. PMH significant for CHF, tobacco abuse, cocaine abuse, obesity and DVT. Pt lives with spouse in 1st floor apartment and is completing ADLs independently and at [facility]. Pt completes IADLs of cooking and cleaning with distant supervision for safety at this time.   ? Patient is accompanied by: Family member   spouse, Raynelle Fanning  ? Pertinent History PMH significant for CHF, tobacco abuse, cocaine abuse, obesity and DVT.   ?  Limitations Global Aphasia   ? Patient Stated Goals unable to ask d/t communication deficits   ? Currently in Pain? No/denies   did not indicate pain  ? Pain Score 0-No pain   ? ?  ?  ? ?  ? ? ? ? [place] OT Assessment - 09/29/21 0923   ? ?  ? Assessment  ? Medical Diagnosis CVA Bil MCA   ? Referring Provider (OT) Bo Merino NP   ? Onset Date/Surgical Date 09/20/21   ? Hand Dominance Left   ? Prior Therapy Acute   ?  ? Precautions  ? Precautions None   ?  ? Balance Screen  ? Has the patient fallen in the past 6 months --   ?  ? Home  Environment  ? Family/patient expects to be discharged to: Private residence   ? Living Arrangements Spouse/significant other   ? Type of Home Aartment   ? Home Access Level entry   ? Bathroom Shower/Tub Tub/Shower unit   ? Bathroom Accessibility Yes   ? Home Equipment None   ?  ? Prior Function  ? Level of Independence Independent   ? Vocation Unemployed   ? Leisure listening to music, drawing   ?  ? ADL  ? ADL comments Pt independent with all ADLs per spouse and patient indication   ?  ? IADL  ? Shopping Needs to be accompanied  on any shopping trip   ? Light Housekeeping Does personal laundry completely   ? Meal Prep Able to complete simple warm meal prep;Able to complete simple cold meal and snack prep   with supervision  ? Community Mobility Relies on family or friends for transportation   ? Medication Management Has difficulty remembering to take medication   ? Financial Management Dependent   ?  ? Written Expression  ? Dominant Hand Left   ?  ? Vision - History  ? Baseline Vision No visual deficits   ?  ? Vision Assessment  ? Comment unable to fully assess d/t cognitive and communication deficits however pt indicates "I can see". completed visual tabletop scanning 88's cancellation with 100% accuracy.   ?  ? Cognition  ? Overall Cognitive Status Impaired/Different from baseline   difficult to assess d/t communication deficits  ?  ? Observation/Other Assessments  ? Focus on  Therapeutic Outcomes (FOTO)  was not captured by front desk at eval   ?  ? Posture/Postural Control  ? Posture/Postural Control Postural limitations   ? Postural Limitations Rounded Shoulders   ?  ? Sensation  ? Additional Comments pt denies any deficits however difficulty to assess d/t communication deficits   ?  ? Coordination  ? 9 Hole Peg Test Right;Left   difficulty to assess d/t communication and not standardized d/t aphasia  ? Right 9 Hole Peg Test 37.85s   ? Left 9 Hole Peg Test 34.74s   ?  ? ROM / Strength  ? AROM / PROM / Strength AROM;Strength   ?  ? AROM  ? Overall AROM  Within functional limits for tasks performed   ?  ? Strength  ? Overall Strength Unable to assess;Other (comment)   ? Overall Strength Comments d/t communication deficits, global aphasia   ?  ? Hand Function  ? Right Hand Gross Grasp Functional   ? Right Hand Grip (lbs) 64.8   ? Left Hand Gross Grasp Functional   ? Left Hand Grip (lbs) 42.3   ? Comment however scores may be skewed d/t deficits with communication, global aphasia   ? ?  ?  ? ?  ? ? ? ? ? ? ? ? ? ? ? ? ? ? ? ? ? ? ? ? ? ? ? ? ? ? ? ? ? ? ? Plan - 09/29/21 0958   ? ? Clinical Impression Statement Pt is a 26 year old male that presents to Neuro OPOT with bilateral embolism MCA on 09/20/21 that was seen in acute setting noted to have R gaze preference, LUE and LE weakness, and global aphasia. PMH significant for CHF, tobacco abuse, cocaine abuse, obesity and DVT.  Pt presents with some LUE weakness however using functionally and completing all basic ADLs with mod I. Skilled occupational therapy is not recommended at this time.   ? OT Occupational Profile and History Problem Focused Assessment - Including review of records relating to presenting problem   ? Occupational performance deficits (Please refer to evaluation for details): IADL's   ? Body Structure / Function / Physical Skills Strength   ? Cognitive Skills Understand;Safety Awareness   ? Clinical Decision Making Limited  treatment options, no task modification necessary   ? Comorbidities Affecting Occupational Performance: None   ? Modification or Assistance to Complete Evaluation  No modification of tasks or assist necessary to complete eval   ? OT Frequency One time visit   ? Consulted and Agree with  Plan of Care Patient;Family member/caregiver   ? Family Member [employer], spouse   ? ?  ?  ? ?  ? ? ?Patient will benefit from skilled therapeutic intervention in order to improve the following deficits and impairments:   ?Body Structure / Function / Physical Skills: Strength ?Cognitive Skills: Understand, Safety Awareness ?  ? ? ?Visit Diagnosis: ?Muscle weakness (generalized) - Plan: Ot plan of care cert/re-cert ? ? ? ?Problem List ?Patient Active Problem List  ? Diagnosis Date Noted  ? Acute ischemic stroke ([place]) 09/20/2021  ? 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 CVA (cerebral vascular accident) ([place]) 09/20/2021  ? Acute on chronic diastolic congestive heart failure, NYHA class 3 ([place]) 09/01/2021  ? Iron deficiency anemia 08/07/2021  ? Multiple pulmonary emboli ([place]) 08/06/2021  ? Acute on chronic HFrEF (heart failure with reduced ejection fraction) ([place]) 08/06/2021  ? Acute deep vein thrombosis (DVT) of right lower extremity ([place]) 08/06/2021  ? Anemia 08/06/2021  ? ? ?Zachery Conch, OT ?09/29/2021, 11:15 AM ? ?[place] [facility] ?[place], [place], [address] ?Phone: [phone]   Fax:  [phone] ? ?Name: Carlos Mckinney ?MRN: [id number] ?Date of Birth: [birth year] ? ?

## 2021-09-29 NOTE — Therapy (Signed)
[facility] [address] [place], [place], [address] Phone: [phone]   Fax:  [phone]  Speech Language Pathology Evaluation  Patient Details  Name: ANTRAVIOUS DHINGRA MRN: [place]:[id number] Date of Birth: 1996/05/08 Referring Provider (SLP): Bo Merino, NP   Encounter Date: 09/29/2021   End of Session - 09/29/21 1035     Visit Number 1    Number of Visits 13    Date for SLP Re-Evaluation 11/25/21    Authorization Type medicare pending    SLP Start Time 0945    SLP Stop Time  1033    SLP Time Calculation (min) 48 min    Activity Tolerance Patient tolerated treatment well             Past Medical History:  Diagnosis Date   Heart failure ([place])    Hypertension    Polysubstance abuse ([place])     Past Surgical History:  Procedure Laterality Date   IR CT HEAD LTD  09/22/2021   IR PERCUTANEOUS ART THROMBECTOMY/INFUSION INTRACRANIAL INC DIAG ANGIO  09/20/2021   RADIOLOGY WITH ANESTHESIA N/A 09/20/2021   Procedure: IR WITH ANESTHESIA;  Surgeon: Radiologist, Medication, MD;  Location: [facility];  Service: Radiology;  Laterality: N/A;    There were no vitals filed for this visit.   Subjective Assessment - 09/29/21 0949     Subjective "I'm hungry"    Patient is accompained by: Family member    Currently in Pain? 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 CIR recommended but patient and SO declined, requested that pt d/c home. Seen as IP for swallow evaluation, put on Dysphagia III with thin lqiuids diet. Attempt to eval for cognitive  communication deficits, declined standardized assessment with SLP. PMH: advanced CHF, tobacco abuse, cocaine abuse, obesity, DVT.    Mobility Status ambulates without assistance      Balance Screen   Has the patient fallen in the past 6 months Yes   at time of acute event   How many times? 1    Has the patient had a decrease in activity level because of a fear of falling?  No    Is the patient reluctant to leave their home because of a fear of falling?  No      Prior Functional Status   Cognitive/Linguistic Baseline Within functional limits    Type of Home House     Lives With Family    Available Support Family    Vocation Unemployed      Cognition   Overall Cognitive Status Impaired/Different from baseline    Area of Impairment Attention;Memory;Following commands;Problem solving    Current Attention Level Focused    Attention Comments attention waxing and waning throughout evaluation, requires tactile cues to orient to stimuli    Memory Decreased short-term memory    Memory Comments reports "sometimes I forget."    Following Commands Follows one step commands inconsistently    Following Command Comments impacted by impaired auditory procressing; impairment  still observed with written cues.    Problem Solving Slow processing    Problem Solving Comments unable to formally assess 2/2 language impairment, however suspect that novel problems would be impacted by pt's impaired processing, attention, and memory.    Behaviors Perseveration;Restless      Auditory Comprehension   Overall Auditory Comprehension Impaired    Yes/No Questions Impaired    Basic Biographical Questions 0-25% accurate    Basic Immediate Environment Questions 0-24% accurate    Complex Questions 0-24% accurate    Paragraph Comprehension (via yes/no questions) Not tested    Commands Impaired    One Step Basic Commands 0-24% accurate   even with max A   Conversation Simple    Other Conversation Comments requires  repetition, simplification, tactile and written cues for minimal engagement in conversation    EffectiveTechniques Extra processing time;Repetition;Visual/Gestural cues   written supports     Visual Recognition/Discrimination   Discrimination Within Function Limits      Reading Comprehension   Reading Status Within funtional limits      Expression   Primary Mode of Expression Verbal      Verbal Expression   Overall Verbal Expression Impaired    Initiation No impairment    Automatic Speech Counting    Level of Generative/Spontaneous Verbalization Phrase    Repetition No impairment    Naming No impairment    Pragmatics Impairment    Impairments Topic appropriateness;Topic maintenance;Turn Taking    Interfering Components Attention    Effective Techniques Written cues    Other Verbal Expression Comments spontenous speech observed to be telegraphic in nature      Oral Motor/Sensory Function   Overall Oral Motor/Sensory Function Appears within functional limits for tasks assessed      Motor Speech   Overall Motor Speech Appears within functional limits for tasks assessed    Respiration Within functional limits    Phonation Normal    Resonance Within functional limits    Articulation Within functional limitis    Intelligibility Intelligible      Standardized Assessments   Standardized Assessments  Other Assessment    Other Assessment QAB - 3.61 - severe aphasia      Individuals Consulted   Consulted and Agree with Results and Recommendations Patient;Family member/caregiver    Family Member Halford [place]amp                             SLP Education - 09/29/21 [phone]     Education Details evaluation results, POC, likely goals, use of written supports in conversation    Person(s) Educated Patient;Spouse    Methods Explanation;Demonstration;Handout    Comprehension Verbalized understanding;Need further instruction              SLP Short Term  Goals - 09/29/21 1130       SLP SHORT TERM GOAL #1   Title Pt will demsontrate auditory comprehension at word level with no more than 1 repetition with min A, 80% trials, across 2 sessions    Baseline 0/6 word level QAB when presented verbally    Time 4    Period Weeks    Status New      SLP SHORT TERM GOAL #2   Title Spouse will teach back x2 novel communication supports to A pt in auditory comprehension at conversational level    Baseline using written supports exclusively    Time 4    Period  Weeks    Status New      SLP SHORT TERM GOAL #3   Title Pt/SO will report successful implementation of x2 attention/memory compensations/supports at home with mod I across 2 sessions    Baseline not using compensations at home    Time 4    Period Weeks    Status New      SLP SHORT TERM GOAL #4   Title Pt will accurately sort medications into pill box based on label instructions with occasional mod A    Baseline emerging skill reported at home, still mostly managed by care partner    Time 4    Period Weeks    Status New              SLP Long Term Goals - 09/29/21 1142       SLP LONG TERM GOAL #1   Title Pt will demsontrate auditory comprehension at phrase level with no more than 1 repetition with min A, 80% trials, across 2 sessions    Baseline impaired at word level    Time 8    Period Weeks    Status New      SLP LONG TERM GOAL #2   Title Spouse will report successful implementation of 2 types of communication supports to A pt in auditory comprehension at conversational level across 1 week period    Baseline using written supports exclusively    Time 8    Period Weeks    Status New      SLP LONG TERM GOAL #3   Title Pt/SO will report successful implementation of x4 attention/memory compensations/supports at home with mod I across 1 week period    Baseline not using compensations at this time    Time 8    Period Weeks    Status New      SLP LONG TERM GOAL #4    Title Pt will accurately manage medications with no missed doses reported > 2 weeks given rare min A from family/care partners/ caregiver    Baseline managed by care partner    Time 8    Period Weeks    Status New              Plan - 09/29/21 1036     Clinical Impression Statement Mr. Georganna Skeans presents for OP ST evaluation, on referral from NP Orion Crook d/t Wernicke's Aphasia. Pt presents with severely impacted receptive language, and cognitive communication deficits in areas of attention and memory. SLP administers QAB in which pt scores 3.61, indicating severe aphasia. Pt score impacted by impaired auditory comprehension and cogntition. Written cues are effective in improving pt's receptive understanding throughout assessment but do not completely remedy receptive language impairment. SLP required to use visual cues of written words throughout administration of QAB to support pt participation. Pt conversational speech observed to be perseveratory on unrelated topics, telegraphic at times (e.g. "thanksgiving... family... desserts" IRT what's you're favorite holiday), has difficulty with topic mx even in simple coversation with visual supports. Pt's cognition not formally assessed this date, however through informal observation, pt has impairments in atention, awareness, memory. Pt observed to requires tactile cues to orient to verbal and visual stimuli throughout assessment, perseverates on hunger or previously asked questions, asks numerous times for repetition. Pt expresses strong desire to return to work, currently unable to d/t language and cognitive impairments. Pt is beginning to participate more fully in ADL such as cooking, cleaning, medication management, schedule management, financial management.  Requires supervision at this time for all ADLs. Recommend skilled ST services to address receptive language impairment (F80.2) and cognitive communication deficits (R41.841).    Speech  Therapy Frequency 2x / week    Duration 8 weeks   to account for scheduling   Treatment/Interventions Environmental controls;Cueing hierarchy;SLP instruction and feedback;Compensatory techniques;Cognitive reorganization;Functional tasks;Compensatory strategies;Internal/external aids;Multimodal communcation approach;Patient/family education    Potential to Achieve Goals Fair    Potential Considerations Severity of impairments    Consulted and Agree with Plan of Care Patient;Family member/caregiver    Family Member Consulted SO, [place]             Patient will benefit from skilled therapeutic intervention in order to improve the following deficits and impairments:   Cognitive communication deficit  Receptive language impairment  Managed medicaid CPT codes: [phone] - SLP treatment, [phone] - Cognitive training (First 15 min), and [id number] - Cognitive training (each additional 15 min)   Problem List Patient Active Problem List   Diagnosis Date Noted   Acute ischemic stroke ([place]) 09/20/2021   Middle cerebral artery embolism, right 09/20/2021   CVA (cerebral vascular accident) ([place]) 09/20/2021   Acute on chronic diastolic congestive heart failure, NYHA class 3 ([place]) 09/01/2021   Iron deficiency anemia 08/07/2021   Multiple pulmonary emboli ([place]) 08/06/2021   Acute on chronic HFrEF (heart failure with reduced ejection fraction) ([place]) 08/06/2021   Acute deep vein thrombosis (DVT) of right lower extremity ([place]) 08/06/2021   Anemia 08/06/2021    Su Monks, CF-SLP 09/29/2021, 12:15 PM  [facility] [address] [place], [place], [address] Phone: [phone]   Fax:  [phone]  Name: ALLENMICHAEL SWALVE MRN: :[id number] Date of Birth: 05-Dec-1995

## 2021-09-30 ENCOUNTER — Telehealth (HOSPITAL_COMMUNITY): Payer: Self-pay

## 2021-09-30 NOTE — Telephone Encounter (Signed)
Called and was unable to leave patient a message to confirm/remind patient of their appointment [facility] on 10/[facility].  ? ? ? ?

## 2021-09-30 NOTE — Progress Notes (Signed)
ADVANCED HF CLINIC NOTE  Primary Care: Pending HF Cardiologist: Dr. Gala Romney  Reason for Visit: Chronic Systolic Heart Failure   HPI: Carlos Mckinney is a 26 y.o.male with hx tobacco use, cocaine abuse, obesity.  He's had occasional ED visits over the years.  No routine care.   ED visit 11/2020 with cough, dyspnea, tachycardia/tachypnea.  Chest x-ray with bilateral mild to moderate perihilar and bibasilar infiltrates.  Some suspicion for COVID, pt prescribed CAP coverage and left AMA before covid testing performed.    He was incarcerated from May - September 2022. Released from prison 04/25/2021.  Hospitalized at [facility] and transferred to [facility] 08/22 for acute systolic HF and was seen by Advanced Heart Failure team. Diuresed with IV lasix and transitioned to po furosemide 20 mg daily.  Started on GDMT with enstresto and spiro. Echo 08/22 with EF 15-20%, mildly dilated RV with normal RV function, mild MR, moderate TR, PAP 40-45 mmHg. Coronary angiogram not performed. 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 Reports heavy cocaine use for a period of time before incarcerated. Denies any recent drug use, still drinking ETOH occasionally.   Established care 05/02/21, mild volume overload and NYHA II-III. Lasix increased.  Seen in ED 05/24/21 for CP and swelling, left without being seen.  Seen back in clinic 11/22 and was fluid overloaded after running out of meds. Diuretics restarted and close f/u was advised but he no-showed the last 2 appts.   Direct admit from clinic 2/23 for a/c CHF and cardiogenic shock. PICC line placed for CVP and co-ox monitoring. Initial co-ox 24%, started on inotrope support with milrinone. Diuresed > 30  lb with IV lasix. Weaned off milrinone once diuresed. UDS + for cocaine and marijuana. Started on GDMT. Refused R/LHC. cMRI - with severe LVEF 12% and RV 18%. Delayed enhancement images poor due to recent feraheme. Previously stopped Eliquis due to hematochezia (felt to be  2/2 hemorrhoids), restarted. He was adamant to go home despite CoOx 40%. Discussed risks and PICC removed. Discharged home, weight 196 lbs.  Post hospital follow up 2/23 doing well, missing a few doses of meds a week. No further cocaine use since hospitalization. Mildly volume up, ReDs 38%. Entresto increased.  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 UDS+ cocaine and THC. Underwent mechanical thrombectomy, not a candidate for TNK due to Eliquis. He continued with left-sided weakness and aphasia. CIR was recommended but patient refused. He was discharged home on Xarelto + ASA with wife, weight 206 lbs.  Today he returns for post hospital HF follow up with his wife. He is very HOH and a lot of the history comes from wife. She says he is improving from hospitalization. He is not SOB walking around the house. Has not need PT but is doing speech therapy. Denies CP, dizziness, edema, or PND/Orthopnea. Appetite ok. No fever or chills. Weight at home 197 pounds. Taking all medications, wife helps manage.  He denies further cocaine use.  Review of systems complete and found to be negative unless listed in HPI.    Current Outpatient Medications  Medication Sig Dispense Refill   albuterol (VENTOLIN HFA) 108 (90 Base) MCG/ACT inhaler Inhale 1-2 puffs into the lungs every 6 (six) hours as needed for wheezing or shortness of breath.     digoxin (LANOXIN) 0.125 MG tablet Take 1 tablet (0.125 mg total) by mouth daily. 30 tablet 6   empagliflozin (JARDIANCE) 10 MG TABS tablet Take  1 tablet (10 mg total) by mouth daily. 30 tablet 6   furosemide (LASIX) 40 MG tablet Take 1 tablet (40 mg total) by mouth daily. 30 tablet 3   hydrocortisone (ANUSOL-HC) 25 MG suppository Place 1 suppository (25 mg total) rectally 2 (two) times daily. (Patient taking differently: Place 25 mg rectally 2 (two) times daily. As needed) 12 suppository 0    phenylephrine-shark liver oil-mineral oil-petrolatum (PREPARATION H) 0.25-14-74.9 % rectal ointment Place 1 application rectally 2 (two) times daily as needed for hemorrhoids. 25 g 1   polyethylene glycol (MIRALAX / GLYCOLAX) 17 g packet Take 17 g by mouth daily. (Patient taking differently: Take 17 g by mouth daily as needed.) 30 each 4   potassium chloride (KLOR-CON) 20 MEQ packet Use 1 packet (20 mEq) by mouth daily. 30 packet 6   rivaroxaban (XARELTO) 10 MG TABS tablet Take 1 tablet (10 mg total) by mouth daily. 30 tablet 1   sacubitril-valsartan (ENTRESTO) 49-51 MG Take 1 tablet by mouth 2 (two) times daily. 180 tablet 3   spironolactone (ALDACTONE) 25 MG tablet Take 1 tablet (25 mg total) by mouth daily. 30 tablet 6   No current facility-administered medications for this encounter.   No Known Allergies  Social History   Socioeconomic History   Marital status: Married    Spouse name: Not on file   Number of children: Not on file   Years of education: Not on file   Highest education level: Not on file  Occupational History   Not on file  Tobacco Use   Smoking status: Former    Types: Cigarettes   Smokeless tobacco: Never  Substance and Sexual Activity   Alcohol use: No   Drug use: No   Sexual activity: Not on file  Other Topics Concern   Not on file  Social History Narrative   Not on file   Social Determinants of Health   Financial Resource Strain: High Risk   Difficulty of Paying Living Expenses: Hard  Food Insecurity: No Food Insecurity   Worried About Running Out of Food in the Last Year: Never true   Ran Out of Food in the Last Year: Never true  Transportation Needs: No Transportation Needs   Lack of Transportation (Medical): No   Lack of Transportation (Non-Medical): No  Physical Activity: Not on file  Stress: Not on file  Social Connections: Not on file  Intimate Partner Violence: Not on file   FH: His maternal cousin has systolic HF and father has heart  issues but details not known.   SH: Homeless. Staying with grandmother presently. Unable to work, applying for disability.  BP 94/60    Pulse 100    Wt 90.2 kg (198 lb 12.8 oz)    SpO2 100%    BMI 27.73 kg/m   Wt Readings from Last 3 Encounters:  10/03/21 90.2 kg (198 lb 12.8 oz)  09/27/21 89.4 kg (197 lb 0.2 oz)  09/20/21 93.9 kg (207 lb 0.2 oz)   PHYSICAL EXAM: General:  NAD. No resp difficulty HEENT: +HOH Neck: Supple. No JVD. Carotids 2+ bilat; no bruits. No lymphadenopathy or thryomegaly appreciated. Cor: PMI nondisplaced. Regular rate & rhythm. No rubs, gallops or murmurs. Lungs: Clear Abdomen: Soft, nontender, nondistended. No hepatosplenomegaly. No bruits or masses. Good bowel sounds. Extremities: No cyanosis, clubbing, rash, edema Neuro: Alert & oriented x 3, cranial nerves grossly intact. Moves all 4 extremities w/o difficulty. Affect pleasant. Perseverates on his fluid status, repeats himself several times, ?  aphasia.  ECG: SR 99 bpm (personally reviewed).  ASSESSMENT & PLAN: 1. 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 Admit for a/c HF 08/22.  - Echo (8/22): EF 15-20%, mildly dilated RV with normal RV function, mild MR, moderate TR, PAP 40-45 mmHg - Echo 08/07/21 EF < 20%.  - Declined R/LHC, states he may consider another time - cMRI 2/23 EF 12% RVEF 18% minimal LGE. Moderate left pleural effusion  - NYHA II, volume ok today. No BP room to increase GDMT today. - Continue Entresto 49/51 mg bid. - Continue Lasix 40 mg daily. - Continue digoxin 0.125 mg daily. - Continue spiro 25 mg daily. - Continue Jardiance 10 mg daily. - No BB with recent CGS. - Not a candidate for advanced therapies with polysubstance abuse.  - BMET, dig level and BNP today. - Wife now helping with medication management.  2. 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 - Continue ASA + Xarelto - Seems to have some receptive and expressive aphasia, but difficult  to differentiate due to hearing loss. - Continue SLP. - He has follow up with Neuro 11/28/21.  3. H/o NSVT: - BMET and Mag today.    4. H/o Snoring: - Sleep study once has insurance.   5. Substance Use: - Cocaine/ETOH and tobacco use history  - UDS + cocaine and THC on admit . - Reports no further cocaine use. - Imperative he remains quit.   6. PE/ RLE DVT - Diagnosed 08/06/21, placed on Eliquis. - Now on Xarelto. No bleeding issues. - CBC today.   7.  Iron Deficiency Anemia  - Felt 2/2 hemorrhoids. Noted previous admit 1/23.  - Received feraheme during 1/23 admit. - No further bleeding issues.  8. Pleural effusion, right - Seen on cMRI. Small on follow up CXR - Lungs clear on exam today, 100% O2 on room air today.  9. SDOH - Now living with wife. - HFSW helping with Medicaid application. - He has transportation, has a pillbox. - Meds thru HF fund. Patient assistance for Entresto and Xarelto.  Follow up in 2 months with Dr. Gala Romney as scheduled.  Carlos Rome, FNP-BC 10/03/21

## 2021-10-03 ENCOUNTER — Ambulatory Visit (HOSPITAL_COMMUNITY)
Admission: RE | Admit: 2021-10-03 | Discharge: 2021-10-03 | Disposition: A | Discharge status: Home / Self Care | Payer: Medicaid Other | Source: Ambulatory Visit | Attending: Family Medicine | Admitting: Family Medicine

## 2021-10-03 ENCOUNTER — Other Ambulatory Visit (HOSPITAL_COMMUNITY): Payer: Self-pay

## 2021-10-03 ENCOUNTER — Encounter (HOSPITAL_COMMUNITY): Payer: Self-pay

## 2021-10-03 ENCOUNTER — Other Ambulatory Visit: Payer: Self-pay

## 2021-10-03 VITALS — BP 94/60 | HR 100 | Wt 198.8 lb

## 2021-10-03 DIAGNOSIS — I5022 Chronic systolic (congestive) heart failure: Secondary | ICD-10-CM | POA: Insufficient documentation

## 2021-10-03 DIAGNOSIS — Z09 Encounter for follow-up examination after completed treatment for conditions other than malignant neoplasm: Secondary | ICD-10-CM | POA: Diagnosis not present

## 2021-10-03 DIAGNOSIS — E669 Obesity, unspecified: Secondary | ICD-10-CM | POA: Insufficient documentation

## 2021-10-03 DIAGNOSIS — Z79899 Other long term (current) drug therapy: Secondary | ICD-10-CM | POA: Diagnosis not present

## 2021-10-03 DIAGNOSIS — Z7984 Long term (current) use of oral hypoglycemic drugs: Secondary | ICD-10-CM | POA: Insufficient documentation

## 2021-10-03 DIAGNOSIS — I639 Cerebral infarction, unspecified: Secondary | ICD-10-CM

## 2021-10-03 DIAGNOSIS — D509 Iron deficiency anemia, unspecified: Secondary | ICD-10-CM | POA: Diagnosis not present

## 2021-10-03 DIAGNOSIS — Z86718 Personal history of other venous thrombosis and embolism: Secondary | ICD-10-CM

## 2021-10-03 DIAGNOSIS — F1290 Cannabis use, unspecified, uncomplicated: Secondary | ICD-10-CM | POA: Diagnosis not present

## 2021-10-03 DIAGNOSIS — Z8673 Personal history of transient ischemic attack (TIA), and cerebral infarction without residual deficits: Secondary | ICD-10-CM | POA: Diagnosis not present

## 2021-10-03 DIAGNOSIS — F1490 Cocaine use, unspecified, uncomplicated: Secondary | ICD-10-CM | POA: Insufficient documentation

## 2021-10-03 DIAGNOSIS — Z7901 Long term (current) use of anticoagulants: Secondary | ICD-10-CM | POA: Diagnosis not present

## 2021-10-03 DIAGNOSIS — Z6827 Body mass index (BMI) 27.0-27.9, adult: Secondary | ICD-10-CM | POA: Insufficient documentation

## 2021-10-03 DIAGNOSIS — Z139 Encounter for screening, unspecified: Secondary | ICD-10-CM

## 2021-10-03 DIAGNOSIS — R531 Weakness: Secondary | ICD-10-CM | POA: Insufficient documentation

## 2021-10-03 DIAGNOSIS — I4729 Other ventricular tachycardia: Secondary | ICD-10-CM

## 2021-10-03 DIAGNOSIS — F1410 Cocaine abuse, uncomplicated: Secondary | ICD-10-CM

## 2021-10-03 DIAGNOSIS — I428 Other cardiomyopathies: Secondary | ICD-10-CM | POA: Insufficient documentation

## 2021-10-03 DIAGNOSIS — R4701 Aphasia: Secondary | ICD-10-CM | POA: Diagnosis not present

## 2021-10-03 DIAGNOSIS — J90 Pleural effusion, not elsewhere classified: Secondary | ICD-10-CM | POA: Insufficient documentation

## 2021-10-03 DIAGNOSIS — Z87891 Personal history of nicotine dependence: Secondary | ICD-10-CM | POA: Insufficient documentation

## 2021-10-03 DIAGNOSIS — Z8709 Personal history of other diseases of the respiratory system: Secondary | ICD-10-CM

## 2021-10-03 DIAGNOSIS — R0683 Snoring: Secondary | ICD-10-CM | POA: Diagnosis not present

## 2021-10-03 LAB — BASIC METABOLIC PANEL
Anion gap: 10 (ref 5–15)
BUN: 5 mg/dL — ABNORMAL LOW (ref 6–20)
CO2: 23 mmol/L (ref 22–32)
Calcium: 9.2 mg/dL (ref 8.9–10.3)
Chloride: 104 mmol/L (ref 98–111)
Creatinine, Ser: 0.92 mg/dL (ref 0.61–1.24)
GFR, Estimated: >60 mL/min (ref >60)
Glucose, Bld: 92 mg/dL (ref 70–99)
Potassium: 3.9 mmol/L (ref 3.5–5.1)
Sodium: 137 mmol/L (ref 135–145)

## 2021-10-03 LAB — CBC
HCT: 33.9 % — ABNORMAL LOW (ref 39.0–52.0)
Hemoglobin: 10.8 g/dL — ABNORMAL LOW (ref 13.0–17.0)
MCH: 28.6 pg (ref 26.0–34.0)
MCHC: 31.9 g/dL (ref 30.0–36.0)
MCV: 89.7 fL (ref 80.0–100.0)
Platelets: 453 10*3/uL — ABNORMAL HIGH (ref 150–400)
RBC: 3.78 MIL/uL — ABNORMAL LOW (ref 4.22–5.81)
RDW: 21.2 % — ABNORMAL HIGH (ref 11.5–15.5)
WBC: 5.3 10*3/uL (ref 4.0–10.5)
nRBC: 0 % (ref 0.0–0.2)

## 2021-10-03 LAB — DIGOXIN LEVEL: Digoxin Level: 0.7 ng/mL — ABNORMAL LOW (ref 0.8–2.0)

## 2021-10-03 MED ORDER — RIVAROXABAN 10 MG PO TABS
10.0000 mg | ORAL_TABLET | Freq: Every day | ORAL | 4 refills | Status: DC
  Filled 2021-10-03: qty 30, 30d supply, fill #0

## 2021-10-03 NOTE — Progress Notes (Signed)
?Heart and Vascular Care Navigation ? ?10/03/2021 ? ?Carlos Mckinney ?March 18, 1996 ?[id number] ? ?Reason for Referral: Post-hospital SDOH screening ?  ?Engaged with patient face to face for follow up visit for Heart and Vascular Care Coordination. ?                                                                                                  ?Assessment:      CSW met with pt and pt wife during clinic visit to complete SDOH screen following recent hospital stay.  Pt currently staying at hotel with his wife and their (4?) children.  Wife reports she pays about $400 a week for hotel- is struggling to find housing because of cost and background check (pt has criminal history and sounds like wife might have credit history making it hard to find housing).  She has been staying at hotel for a year but pt just moved in with them following recent hospital stay.  Reports no concerns with transportation or food at this time.  Pt had pending medicaid case with [employer] so will hopefully get insurance soon. ? ?CSW informed pt and wife of patient care fund and that we could offer some assistance with start up costs for new apartment if they are able to locate one. ? ?They report no other needs at this time.  Pt currently struggling with hearing following recent strokes- they are hopeful he will show improvement.                               ? ?HRT/VAS Care Coordination   ? ? Living arrangements for the past 2 months Hotel/Motel  ? Lives with: Minor Children; Spouse  ? Patient Current Insurance Coverage Medicaid Pending  ? Patient Has Concern With Paying Medical Bills Yes  ? Does Patient Have Prescription Coverage? No  ? Patient Prescription Assistance [place]; Patient Assistance Programs  ? Home Assistive Devices/Equipment None  ? [place] provider  ? ?  ? ? ?Social History:                                                                             ?SDOH Screenings  ? ?Alcohol  Screen: Not on file  ?Depression (PHQ2-9): Low Risk   ? PHQ-2 Score: 3  ?Financial Resource Strain: High Risk  ? Difficulty of Paying Living Expenses: Hard  ?Food Insecurity: No Food Insecurity  ? Worried About Charity fundraiser in the Last Year: Never true  ? Ran Out of Food in the Last Year: Never true  ?Housing: Low Risk   ? Last Housing Risk Score: 0  ?Physical Activity: Not on file  ?Social Connections: Not on file  ?Stress: Not  on file  ?Tobacco Use: Medium Risk  ? Smoking Tobacco Use: Former  ? Smokeless Tobacco Use: Never  ? Passive Exposure: Not on file  ?Transportation Needs: No Transportation Needs  ? Lack of Transportation (Medical): No  ? Lack of Transportation (Non-Medical): No  ? ? ?SDOH Interventions: ?Financial Resources:    ?Wife is only source of income at this time  ?Food Insecurity:  Food Insecurity Interventions: Intervention Not Indicated  ?Housing Insecurity:  Housing Interventions: Intervention Not Indicated  ?Transportation:   Transportation Interventions: Intervention Not Indicated wife drives  ? ? ? ?Follow-up plan:   ? ?Pt and wife working on finding long term housing option- will reach out if they need further assistance with this. ? ?Will continue to follow and assist as needed ? ?Jorge Ny, LCSW ?Clinical Social Worker ?[facility] ?Desk#: [phone] ?Cell#: [phone] ? ? ? ?

## 2021-10-03 NOTE — Patient Instructions (Addendum)
Thank you for coming in today ? ?Labs were done today, if any labs are abnormal the clinic will call you ?No new is good news ? ?Your physician recommends that you schedule a follow-up appointment in:  ?Please keep May appointment with Dr. Gala Romney ? ?Regarding Entresto please call [employer] [phone] ? ?At the [facility], you and your health needs are our priority. As part of our continuing mission to provide you with exceptional heart care, we have created designated Provider Care Teams. These Care Teams include your primary Cardiologist (physician) and Advanced Practice Providers (APPs- Physician Assistants and Nurse Practitioners) who all work together to provide you with the care you need, when you need it.  ? ?You may see any of the following providers on your designated Care Team at your next follow up: ?Dr Arvilla Meres ?Dr Marca Ancona ?Tonye Becket, NP ?Robbie Lis, PA ?Jessica Milford,NP ?Anna Genre, PA ?Karle Plumber, PharmD ? ? ?Please be sure to bring in all your medications bottles to every appointment.  ? ?If you have any questions or concerns before your next appointment please send [place] a message through [place] or call our office at [phone].   ? ?TO LEAVE A MESSAGE FOR THE NURSE SELECT OPTION 2, PLEASE LEAVE A MESSAGE INCLUDING: ?YOUR NAME ?DATE OF BIRTH ?CALL BACK NUMBER ?REASON FOR CALL**this is important as we prioritize the call backs ? ?YOU WILL RECEIVE A CALL BACK THE SAME DAY AS LONG AS YOU CALL BEFORE 4:00 PM ? ?

## 2021-10-04 ENCOUNTER — Other Ambulatory Visit (HOSPITAL_COMMUNITY): Payer: Self-pay

## 2021-10-04 ENCOUNTER — Ambulatory Visit: Payer: Medicaid Other | Admitting: Speech Pathology

## 2021-10-06 ENCOUNTER — Inpatient Hospital Stay (HOSPITAL_COMMUNITY)
Admission: RE | Admit: 2021-10-06 | Discharge: 2021-10-06 | Disposition: A | Discharge status: Home / Self Care | Payer: Medicaid Other | Source: Ambulatory Visit

## 2021-10-06 ENCOUNTER — Ambulatory Visit: Payer: Medicaid Other | Admitting: Speech Pathology

## 2021-10-06 ENCOUNTER — Other Ambulatory Visit: Payer: Self-pay

## 2021-10-06 DIAGNOSIS — R41841 Cognitive communication deficit: Secondary | ICD-10-CM

## 2021-10-06 DIAGNOSIS — F802 Mixed receptive-expressive language disorder: Secondary | ICD-10-CM | POA: Diagnosis not present

## 2021-10-06 DIAGNOSIS — M6281 Muscle weakness (generalized): Secondary | ICD-10-CM | POA: Diagnosis not present

## 2021-10-06 NOTE — Therapy (Signed)
[facility] [address] [place], [place], [address] Phone: [phone]   Fax:  [phone]  Speech Language Pathology Treatment  Patient Details  Name: Carlos Mckinney MRN: [id number] Date of Birth: Apr 04, 1996 Referring Provider (SLP): Bo Merino, NP   Encounter Date: 10/06/2021   End of Session - 10/06/21 1454     Visit Number 2    Number of Visits 13    Date for SLP Re-Evaluation 11/25/21    Authorization Type medicare pending -- requested initial 3 visits, will request 9 more following auth    SLP Start Time 1400    SLP Stop Time  [id number]    SLP Time Calculation (min) 45 min    Activity Tolerance Patient tolerated treatment well             Past Medical History:  Diagnosis Date   Heart failure ([place])    Hypertension    Polysubstance abuse ([place])     Past Surgical History:  Procedure Laterality Date   IR CT HEAD LTD  09/22/2021   IR PERCUTANEOUS ART THROMBECTOMY/INFUSION INTRACRANIAL INC DIAG ANGIO  09/20/2021   RADIOLOGY WITH ANESTHESIA N/A 09/20/2021   Procedure: IR WITH ANESTHESIA;  Surgeon: Radiologist, Medication, MD;  Location: [facility];  Service: Radiology;  Laterality: N/A;    There were no vitals filed for this visit.   Subjective Assessment - 10/06/21 1406     Subjective "I'm doing good, how are you."    Patient is accompained by: Family member    Currently in Pain? No/denies                   ADULT SLP TREATMENT - 10/06/21 1406       General Information   Behavior/Cognition Alert;Cooperative      Cognitive-Linquistic Treatment   Treatment focused on Cognition;Aphasia    Skilled Treatment Target auditory comprehension through use of facilitating focus on speaker, modfying message (using direct language, simplying message, repetition), having pt repeat what was said, and fading support of written word pt heavily relies upon. Pt able to accurately ID 6 of 8 functional objects given  description or function of object, following learning period using target word (e.g. Use at dinner for target "salt shaker."). Provide care partner education and coaching on communication strategies to faciliate increased auditory comprehension at home. See pt instructions.      Assessment / Recommendations / Plan   Plan Continue with current plan of care      Progression Toward Goals   Progression toward goals Progressing toward goals              SLP Education - 10/06/21 1453     Education Details conversation partner coaching; attention/auditory comp strategies    Person(s) Educated Patient;Spouse    Comprehension Verbalized understanding;Returned demonstration;Verbal cues required;Need further instruction              SLP Short Term Goals - 10/06/21 1455       SLP SHORT TERM GOAL #1   Title Pt will demsontrate auditory comprehension at word level with no more than 1 repetition with min A, 80% trials, across 2 sessions    Baseline 0/6 word level QAB when presented verbally    Time 4    Period Weeks    Status On-going      SLP SHORT TERM GOAL #2   Title Spouse will teach back x2 novel communication supports to A pt in auditory comprehension at conversational  level    Baseline using written supports exclusively    Time 4    Period Weeks    Status On-going      SLP SHORT TERM GOAL #3   Title Pt/SO will report successful implementation of x2 attention/memory compensations/supports at home with mod I across 2 sessions    Baseline not using compensations at home    Time 4    Period Weeks    Status On-going      SLP SHORT TERM GOAL #4   Title Pt will accurately sort medications into pill box based on label instructions with occasional mod A    Baseline emerging skill reported at home, still mostly managed by care partner    Time 4    Period Weeks    Status On-going              SLP Long Term Goals - 10/06/21 1456       SLP LONG TERM GOAL #1   Title Pt  will demsontrate auditory comprehension at phrase level with no more than 1 repetition with min A, 80% trials, across 2 sessions    Baseline impaired at word level    Time 8    Period Weeks    Status On-going      SLP LONG TERM GOAL #2   Title Spouse will report successful implementation of 2 types of communication supports to A pt in auditory comprehension at conversational level across 1 week period    Baseline using written supports exclusively    Time 8    Period Weeks    Status On-going      SLP LONG TERM GOAL #3   Title Pt/SO will report successful implementation of x4 attention/memory compensations/supports at home with mod I across 1 week period    Baseline not using compensations at this time    Time 8    Period Weeks    Status On-going      SLP LONG TERM GOAL #4   Title Pt will accurately manage medications with no missed doses reported > 2 weeks given rare min A from family/care partners/ caregiver    Baseline managed by care partner    Time 8    Period Weeks    Status On-going              Plan - 10/06/21 1454     Clinical Impression Statement Carlos Mckinney presents with Wernicke's Aphasia, significantly impacted receptive language, and cognitive communication deficits in areas of attention and memory. SLP administers QAB in which pt scores 3.61, indicating severe aphasia. Pt score impacted by impaired auditory comprehension and cogntition. Written cues are effective in improving pt's receptive understanding throughout assessment but do not completely remedy receptive language impairment. SLP required to use visual cues of written words throughout administration of QAB to support pt participation. Pt conversational speech observed to be perseveratory on unrelated topics, telegraphic at times (e.g. "thanksgiving... family... desserts" IRT what's you're favorite holiday), has difficulty with topic mx even in simple coversation with visual supports. Pt's cognition not  formally assessed this date, however through informal observation, pt has impairments in atention, awareness, memory. Pt observed to requires tactile cues to orient to verbal and visual stimuli throughout assessment, perseverates on hunger or previously asked questions, asks numerous times for repetition. Pt expresses strong desire to return to work, currently unable to d/t language and cognitive impairments. Pt is beginning to participate more fully in ADL such as cooking, cleaning,  medication management, schedule management, financial management. Requires supervision at this time for all ADLs. Recommend skilled ST services to address receptive language impairment (F80.2) and cognitive communication deficits (R41.841).    Speech Therapy Frequency 2x / week    Duration 8 weeks   to account for scheduling   Treatment/Interventions Environmental controls;Cueing hierarchy;SLP instruction and feedback;Compensatory techniques;Cognitive reorganization;Functional tasks;Compensatory strategies;Internal/external aids;Multimodal communcation approach;Patient/family education    Potential to Achieve Goals Fair    Potential Considerations Severity of impairments    Consulted and Agree with Plan of Care Patient;Family member/caregiver    Family Member Consulted SO, [facility]             Patient will benefit from skilled therapeutic intervention in order to improve the following deficits and impairments:   Cognitive communication deficit  Receptive language impairment    Problem List Patient Active Problem List   Diagnosis Date Noted   Acute ischemic stroke ([place]) 09/20/2021   Middle cerebral artery embolism, right 09/20/2021   CVA (cerebral vascular accident) ([place]) 09/20/2021   Acute on chronic diastolic congestive heart failure, NYHA class 3 ([place]) 09/01/2021   Iron deficiency anemia 08/07/2021   Multiple pulmonary emboli ([place]) 08/06/2021   Acute on chronic HFrEF (heart failure with reduced ejection  fraction) ([place]) 08/06/2021   Acute deep vein thrombosis (DVT) of right lower extremity ([place]) 08/06/2021   Anemia 08/06/2021    Su Monks, CF-SLP 10/06/2021, 3:00 PM  [facility] [address] [place], [place], [address] Phone: [phone]   Fax:  [phone]   Name: Carlos DANKENBRING MRN: [id number] Date of Birth: 08/11/1995

## 2021-10-06 NOTE — Patient Instructions (Signed)
Create relevant word list:  ? ?Family Names: ? ?Favorite foods: ? ?Places you go:  ? ?TV shows: ? ?Movies: ? ?Other topics you'd like to talk: vacations, music, driving ? ?Sending the message: ?Get attention, make sure Jaxyn is looking at you ?Short phrases--- cut out unnecessary words ?Direct language -- get to the point  ?Try gestures or pictures before using printed text to support simple speech ? ?Receiving the message: ?Look at speaker ?Repeat what was said  ? ?Use texting to support verbal speech as necessary ?

## 2021-10-11 ENCOUNTER — Other Ambulatory Visit (HOSPITAL_COMMUNITY): Payer: Self-pay

## 2021-10-11 ENCOUNTER — Ambulatory Visit: Payer: Medicaid Other | Admitting: Speech Pathology

## 2021-10-13 ENCOUNTER — Ambulatory Visit: Payer: Medicaid Other | Admitting: Speech Pathology

## 2021-10-13 ENCOUNTER — Telehealth: Payer: Self-pay | Admitting: Speech Pathology

## 2021-10-13 NOTE — Telephone Encounter (Signed)
Called pt's SO, Moesha (preferred contact for pt) d/t pt having x2 no-show appointments this week. She tells ST that pt has been in a "mood" this week and declined coming to therapy. She reports he does plan to return to therapy. ST advises of clinics no-show policy and that with another missed visit, all future appointments will be cancelled. Advised of next appointment and confirmed that scheduled appointments still work for pt and SO schedule, to which [employer] agreement.  ?

## 2021-10-14 NOTE — Telephone Encounter (Signed)
Advanced Heart Failure Patient Advocate Encounter ? ?Called [employer] to ensure they received the updated caregiver information for the patient. Representative confirmed the new information was received. A 90 day supply was sent on 03/07. ? ?Archer Asa, CPhT ? ?

## 2021-10-17 ENCOUNTER — Other Ambulatory Visit (HOSPITAL_COMMUNITY): Payer: Self-pay

## 2021-10-18 ENCOUNTER — Ambulatory Visit: Payer: Medicaid Other | Admitting: Speech Pathology

## 2021-10-18 ENCOUNTER — Other Ambulatory Visit: Payer: Self-pay

## 2021-10-18 DIAGNOSIS — R41841 Cognitive communication deficit: Secondary | ICD-10-CM

## 2021-10-18 DIAGNOSIS — M6281 Muscle weakness (generalized): Secondary | ICD-10-CM | POA: Diagnosis not present

## 2021-10-18 DIAGNOSIS — F802 Mixed receptive-expressive language disorder: Secondary | ICD-10-CM

## 2021-10-18 NOTE — Patient Instructions (Addendum)
Conversation strategies: ?Slow down ?Limit external distractions  ?Keep our messages short  ?Repeat as needed ? Make sure you have Nyan's attention ? ?Ask, "can you say that again, can you slow down" ? ?Watching TV -- try turning on closed captions  ?Listening to music -- read lyrics while listening  ? ?Think of a movie you can watch over the weekend that we can talk about next Tuesday.  ?

## 2021-10-18 NOTE — Therapy (Signed)
[facility] ?[place] ?[place], [place], [address] ?Phone: [phone]   Fax:  [phone] ? ?Speech Language Pathology Treatment ? ?Patient Details  ?Name: Carlos Mckinney ?MRN: [place]:[id number] ?Date of Birth: 06/14/1996 ?Referring Provider (SLP): Bo Merino, NP ? ? ?Encounter Date: 10/18/2021 ? ? End of Session - 10/18/21 0839   ? ? Visit Number 3   ? Number of Visits 13   ? Date for SLP Re-Evaluation 11/25/21   ? Authorization Type medicare pending   ? SLP Start Time 0845   ? SLP Stop Time  [phone]   ? SLP Time Calculation (min) 38 min   ? Activity Tolerance Patient tolerated treatment well   ? ?  ?  ? ?  ? ? ?Past Medical History:  ?Diagnosis Date  ? Heart failure ([place])   ? Hypertension   ? Polysubstance abuse ([place])   ? ? ?Past Surgical History:  ?Procedure Laterality Date  ? IR CT HEAD LTD  09/22/2021  ? IR PERCUTANEOUS ART THROMBECTOMY/INFUSION INTRACRANIAL INC DIAG ANGIO  09/20/2021  ? RADIOLOGY WITH ANESTHESIA N/A 09/20/2021  ? Procedure: IR WITH ANESTHESIA;  Surgeon: Radiologist, Medication, MD;  Location: [facility];  Service: Radiology;  Laterality: N/A;  ? ? ?There were no vitals filed for this visit. ? ? Subjective Assessment - 10/18/21 0842   ? ? Subjective "My kids been talking me to death"   ? Patient is accompained by: Family member   ? Currently in Pain? No/denies   ? ?  ?  ? ?  ? ? ? ? ? ? ? ? ADULT SLP TREATMENT - 10/18/21 0848   ? ?  ? General Information  ? Behavior/Cognition Alert;Cooperative   ?  ? Cognitive-Linquistic Treatment  ? 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 Skilled Treatment Provide education and coaching on conversational supports to increase auditory comprhension and converstional participation at home. Addressed Moesha's role as advocate and educator for other communication partners. Target self-advocacy for pt, using script of "slow down" or "can you say that again." Pt able to use in session x2 with mod A. Increased aud  comp noted in conversational speech this date with only occsional written supports required to aid in topic mx and understanding. Moesha reports increased comprehension, participation in ADLs, error awareness.   ?  ? Assessment / Recommendations / Plan  ? Plan Continue with current plan of care   ?  ? Progression Toward Goals  ? Progression toward goals Progressing toward goals   ? ?  ?  ? ?  ? ? ? SLP Education - 10/18/21 0930   ? ? Education Details comprehension strategies, self-advocacy   ? Person(s) Educated Patient;Spouse   ? Methods Explanation;Demonstration;Handout   ? Comprehension Verbalized understanding;Returned demonstration;Need further instruction   ? ?  ?  ? ?  ? ? ? SLP Short Term Goals - 10/18/21 0841   ? ?  ? SLP SHORT TERM GOAL #1  ? Title Pt will demsontrate auditory comprehension at word level with no more than 1 repetition with min A, 80% trials, across 2 sessions   ? Baseline 0/6 word level QAB when presented verbally   ? Time 3   ? Period Weeks   ? Status On-going   ?  ? SLP SHORT TERM GOAL #2  ? Title Spouse will teach back x2 novel communication supports to A pt in auditory comprehension at conversational level   ? Baseline using written supports exclusively   ?  Time 3   ? Period Weeks   ? Status On-going   ?  ? SLP SHORT TERM GOAL #3  ? Title Pt/SO will report successful implementation of x2 attention/memory compensations/supports at home with mod I across 2 sessions   ? Baseline not using compensations at home   ? Time 3   ? Period Weeks   ? Status On-going   ?  ? SLP SHORT TERM GOAL #4  ? Title Pt will accurately sort medications into pill box based on label instructions with occasional mod A   ? Baseline emerging skill reported at home, still mostly managed by care partner   ? Time 3   ? Period Weeks   ? Status On-going   ? ?  ?  ? ?  ? ? ? SLP Long Term Goals - 10/18/21 [id number]   ? ?  ? SLP LONG TERM GOAL #1  ? Title Pt will demsontrate auditory comprehension at phrase level with no more  than 1 repetition with min A, 80% trials, across 2 sessions   ? Baseline impaired at word level   ? Time 7   ? Period Weeks   ? Status On-going   ?  ? SLP LONG TERM GOAL #2  ? Title Spouse will report successful implementation of 2 types of communication supports to A pt in auditory comprehension at conversational level across 1 week period   ? Baseline using written supports exclusively   ? Time 7   ? Period Weeks   ? Status On-going   ?  ? SLP LONG TERM GOAL #3  ? Title Pt/SO will report successful implementation of x4 attention/memory compensations/supports at home with mod I across 1 week period   ? Baseline not using compensations at this time   ? Time 7   ? Period Weeks   ? Status On-going   ?  ? SLP LONG TERM GOAL #4  ? Title Pt will accurately manage medications with no missed doses reported > 2 weeks given rare min A from family/care partners/ caregiver   ? Baseline managed by care partner   ? Time 7   ? Period Weeks   ? Status On-going   ? ?  ?  ? ?  ? ? ? Plan - 10/18/21 0839   ? ? Clinical Impression Statement Continues to present with significantly impacted receptive language, and cognitive communication deficits in areas of attention and memory. Benefits from written cues and frequent redirection for attention during verbal communication. Pt conversational speech observed to be perseveratory on unrelated topics, telegraphic at times (e.g. "thanksgiving... family... desserts" IRT what's you're favorite holiday), has difficulty with topic mx even in simple coversation with visual supports. Pt expresses strong desire to return to work, currently unable to d/t language and cognitive impairments. Pt is beginning to participate more fully in ADL such as cooking, cleaning, medication management, schedule management, financial management. Requires supervision at this time for all ADLs. Recommend continued skilled ST services to address receptive language and cognitive deficits.   ? Speech Therapy Frequency 2x  / week   ? Duration 8 weeks   to account for scheduling  ? Treatment/Interventions Environmental controls;Cueing hierarchy;SLP instruction and feedback;Compensatory techniques;Cognitive reorganization;Functional tasks;Compensatory strategies;Internal/external aids;Multimodal communcation approach;Patient/family education   ? Potential to [place]   ? Potential Considerations Severity of impairments   ? Consulted and Agree with Plan of Care Patient;Family member/caregiver   ? Family Member Consulted SO, Moesha   ? ?  ?  ? ?  ? ? ?  Patient will benefit from skilled therapeutic intervention in order to improve the following deficits and impairments:   ?Receptive language impairment ? ?Cognitive communication deficit ? ? ? ?Problem List ?Patient Active Problem List  ? Diagnosis Date Noted  ? Acute ischemic stroke ([place]) 09/20/2021  ? 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 CVA (cerebral vascular accident) ([place]) 09/20/2021  ? Acute on chronic diastolic congestive heart failure, NYHA class 3 ([place]) 09/01/2021  ? Iron deficiency anemia 08/07/2021  ? Multiple pulmonary emboli ([place]) 08/06/2021  ? Acute on chronic HFrEF (heart failure with reduced ejection fraction) ([place]) 08/06/2021  ? Acute deep vein thrombosis (DVT) of right lower extremity ([place]) 08/06/2021  ? Anemia 08/06/2021  ? ? ?Su Monks, CF-SLP ?10/18/2021, 9:30 AM ? ?[facility] [place] ?[place] ?[place], [place], [address] ?Phone: [phone]   Fax:  [phone] ? ? ?Name: GIANNY RUPE ?MRN: [place]:[id number] ?Date of Birth: Nov 25, 1995 ? ?

## 2021-10-20 ENCOUNTER — Other Ambulatory Visit: Payer: Self-pay

## 2021-10-20 ENCOUNTER — Ambulatory Visit: Payer: Medicaid Other | Admitting: Speech Pathology

## 2021-10-20 DIAGNOSIS — M6281 Muscle weakness (generalized): Secondary | ICD-10-CM | POA: Diagnosis not present

## 2021-10-20 DIAGNOSIS — F802 Mixed receptive-expressive language disorder: Secondary | ICD-10-CM | POA: Diagnosis not present

## 2021-10-20 DIAGNOSIS — R41841 Cognitive communication deficit: Secondary | ICD-10-CM | POA: Diagnosis not present

## 2021-10-20 NOTE — Therapy (Signed)
[facility] ?[place] ?[place], [place], [address] ?Phone: [phone]   Fax:  [phone] ? ?Speech Language Pathology Treatment ? ?Patient Details  ?Name: Carlos Mckinney ?MRN: [place]:[id number] ?Date of Birth: [birth year] ?Referring Provider (SLP): Bo Merino, NP ? ? ?Encounter Date: 10/20/2021 ? ? End of Session - 10/20/21 1107   ? ? Visit Number 4   ? Number of Visits 13   ? Date for SLP Re-Evaluation 11/25/21   ? Authorization Type medicare pending   ? SLP Start Time 0845   ? SLP Stop Time  0930   ? SLP Time Calculation (min) 45 min   ? Activity Tolerance Patient tolerated treatment well   ? ?  ?  ? ?  ? ? ?Past Medical History:  ?Diagnosis Date  ? Heart failure ([place])   ? Hypertension   ? Polysubstance abuse ([place])   ? ? ?Past Surgical History:  ?Procedure Laterality Date  ? IR CT HEAD LTD  09/22/2021  ? IR PERCUTANEOUS ART THROMBECTOMY/INFUSION INTRACRANIAL INC DIAG ANGIO  09/20/2021  ? RADIOLOGY WITH ANESTHESIA N/A 09/20/2021  ? Procedure: IR WITH ANESTHESIA;  Surgeon: Radiologist, Medication, MD;  Location: [facility];  Service: Radiology;  Laterality: N/A;  ? ? ?There were no vitals filed for this visit. ? ? Subjective Assessment - 10/20/21 0849   ? ? Subjective "I'm going alright."   ? Patient is accompained by: Family member   ? Currently in Pain? No/denies   ? Pain Score 0-No pain   ? ?  ?  ? ?  ? ? ? ? ? ? ? ? ADULT SLP TREATMENT - 10/20/21 0850   ? ?  ? General Information  ? Behavior/Cognition Alert;Cooperative   ?  ? Cognitive-Linquistic Treatment  ? 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 Skilled Treatment Word level comprehension, given repetitions when asked by pt, 90% accurate this date.Target auditory comprehension, phrase to sentence level, through the implementation of conversational supports and compensations. Pt requires repetition 50% of utterances, demonstrates increased topic mx and self-correction throughout simple conversations.   Written cues faded when able but still necessary when changing topic or asking more complex question. Provide conversation partner coaching to SO to faciliate increase auditory comprehension at home. Discussed supports pt could implement to further enjoy music and movies which pt reports continue to be difficult. Pt plans to limit distractions and trial closed captions on movie this weekend and will discuss with ST next visit.   ?  ? Assessment / Recommendations / Plan  ? Plan Continue with current plan of care   ?  ? Progression Toward Goals  ? Progression toward goals Progressing toward goals   ? ?  ?  ? ?  ? ? ? SLP Education - 10/20/21 1112   ? ? Education Details conversational supports/strategies   ? Person(s) Educated Patient;Spouse   ? Methods Explanation;Demonstration;Handout   ? Comprehension Verbalized understanding;Need further instruction;Returned demonstration   ? ?  ?  ? ?  ? ? ? SLP Short Term Goals - 10/20/21 0903   ? ?  ? SLP SHORT TERM GOAL #1  ? Title Pt will demsontrate auditory comprehension at word level with no more than 1 repetition with min A, 80% trials, across 2 sessions   ? Baseline 0/6 word level QAB when presented verbally   ? Time 3   ? Period Weeks   ? Status On-going   ?  ? SLP SHORT TERM GOAL #2  ?  Title Spouse will teach back x2 novel communication supports to A pt in auditory comprehension at conversational level   ? Baseline using written supports exclusively   ? Time 3   ? Period Weeks   ? Status On-going   ?  ? SLP SHORT TERM GOAL #3  ? Title Pt/SO will report successful implementation of x2 attention/memory compensations/supports at home with mod I across 2 sessions   ? Baseline not using compensations at home   ? Time 3   ? Period Weeks   ? Status On-going   ?  ? SLP SHORT TERM GOAL #4  ? Title Pt will accurately sort medications into pill box based on label instructions with occasional mod A   ? Baseline emerging skill reported at home, still mostly managed by care partner    ? Time 3   ? Period Weeks   ? Status On-going   ? ?  ?  ? ?  ? ? ? SLP Long Term Goals - 10/20/21 0903   ? ?  ? SLP LONG TERM GOAL #1  ? Title Pt will demsontrate auditory comprehension at phrase level with no more than 1 repetition with min A, 80% trials, across 2 sessions   ? Baseline impaired at word level   ? Time 7   ? Period Weeks   ? Status On-going   ?  ? SLP LONG TERM GOAL #2  ? Title Spouse will report successful implementation of 2 types of communication supports to A pt in auditory comprehension at conversational level across 1 week period   ? Baseline using written supports exclusively   ? Time 7   ? Period Weeks   ? Status On-going   ?  ? SLP LONG TERM GOAL #3  ? Title Pt/SO will report successful implementation of x4 attention/memory compensations/supports at home with mod I across 1 week period   ? Baseline not using compensations at this time   ? Time 7   ? Period Weeks   ? Status On-going   ?  ? SLP LONG TERM GOAL #4  ? Title Pt will accurately manage medications with no missed doses reported > 2 weeks given rare min A from family/care partners/ caregiver   ? Baseline managed by care partner   ? Time 7   ? Period Weeks   ? Status On-going   ? ?  ?  ? ?  ? ? ? Plan - 10/20/21 0903   ? ? Clinical Impression Statement Continues to present with significantly impacted receptive language, and cognitive communication deficits in areas of attention and memory. Benefits from written cues and frequent redirection for attention during verbal communication. Pt conversational speech observed to be perseveratory on unrelated topics, telegraphic at times (e.g. "thanksgiving... family... desserts" IRT what's you're favorite holiday), has difficulty with topic mx even in simple coversation with visual supports. Pt expresses strong desire to return to work, currently unable to d/t language and cognitive impairments. Pt is beginning to participate more fully in ADL such as cooking, cleaning, medication management,  schedule management, financial management. Requires supervision at this time for all ADLs. Recommend continued skilled ST services to address receptive language and cognitive deficits.   ? Speech Therapy Frequency 2x / week   ? Duration 8 weeks   to account for scheduling  ? Treatment/Interventions Environmental controls;Cueing hierarchy;SLP instruction and feedback;Compensatory techniques;Cognitive reorganization;Functional tasks;Compensatory strategies;Internal/external aids;Multimodal communcation approach;Patient/family education   ? Potential to [place]   ?  Potential Considerations Severity of impairments   ? Consulted and Agree with Plan of Care Patient;Family member/caregiver   ? Family Member Consulted SO, Moesha   ? ?  ?  ? ?  ? ? ?Patient will benefit from skilled therapeutic intervention in order to improve the following deficits and impairments:   ?Cognitive communication deficit ? ? ? ?Problem List ?Patient Active Problem List  ? Diagnosis Date Noted  ? Acute ischemic stroke ([place]) 09/20/2021  ? 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 CVA (cerebral vascular accident) ([place]) 09/20/2021  ? Acute on chronic diastolic congestive heart failure, NYHA class 3 ([place]) 09/01/2021  ? Iron deficiency anemia 08/07/2021  ? Multiple pulmonary emboli ([place]) 08/06/2021  ? Acute on chronic HFrEF (heart failure with reduced ejection fraction) ([place]) 08/06/2021  ? Acute deep vein thrombosis (DVT) of right lower extremity ([place]) 08/06/2021  ? Anemia 08/06/2021  ? ? ?Su Monks, CF-SLP ?10/20/2021, 11:13 AM ? ?[facility] ?[place] ?[place], [place], [address] ?Phone: [phone]   Fax:  [phone] ? ? ?Name: SHAQUIEL WESTOVER ?MRN: [place]:[id number] ?Date of Birth: 1995/08/08 ? ?

## 2021-10-25 ENCOUNTER — Other Ambulatory Visit: Payer: Self-pay

## 2021-10-25 ENCOUNTER — Other Ambulatory Visit (HOSPITAL_COMMUNITY): Payer: Self-pay

## 2021-10-25 ENCOUNTER — Ambulatory Visit: Payer: Medicaid Other | Admitting: Speech Pathology

## 2021-10-25 DIAGNOSIS — R41841 Cognitive communication deficit: Secondary | ICD-10-CM | POA: Diagnosis not present

## 2021-10-25 DIAGNOSIS — F802 Mixed receptive-expressive language disorder: Secondary | ICD-10-CM

## 2021-10-25 DIAGNOSIS — M6281 Muscle weakness (generalized): Secondary | ICD-10-CM | POA: Diagnosis not present

## 2021-10-25 MED ORDER — RIVAROXABAN 20 MG PO TABS
20.0000 mg | ORAL_TABLET | Freq: Every day | ORAL | 0 refills | Status: DC
  Filled 2021-10-25: qty 30, 30d supply, fill #0

## 2021-10-25 MED ORDER — RIVAROXABAN 20 MG PO TABS
20.0000 mg | ORAL_TABLET | Freq: Every day | ORAL | 11 refills | Status: DC
  Filled 2021-10-25: qty 30, 30d supply, fill #0
  Filled 2021-11-30: qty 30, 30d supply, fill #1
  Filled 2022-01-06: qty 30, 30d supply, fill #2
  Filled 2022-03-09 – 2022-03-13 (×2): qty 30, 30d supply, fill #3
  Filled 2022-04-18 (×2): qty 30, 30d supply, fill #4
  Filled 2022-05-08: qty 30, 30d supply, fill #5
  Filled 2022-06-19: qty 30, 30d supply, fill #6
  Filled 2022-07-17: qty 30, 30d supply, fill #7
  Filled 2022-08-18: qty 30, 30d supply, fill #8
  Filled 2022-08-18 – 2022-10-06 (×5): qty 30, 30d supply, fill #0

## 2021-10-25 NOTE — Therapy (Signed)
[facility] ?[place] ?[place], [place], [address] ?Phone: [phone]   Fax:  [phone] ? ?Speech Language Pathology Treatment ? ?Patient Details  ?Name: Carlos Mckinney ?MRN: [place]:[id number] ?Date of Birth: 1995-08-13 ?Referring Provider (SLP): Bo Merino, NP ? ? ?Encounter Date: 10/25/2021 ? ? End of Session - 10/25/21 0919   ? ? Visit Number 5   ? Number of Visits 13   ? Date for SLP Re-Evaluation 11/25/21   ? Authorization Type medicare pending   ? SLP Start Time [phone]   ? SLP Stop Time  0924   ? SLP Time Calculation (min) 38 min   ? Activity Tolerance Patient tolerated treatment well   ? ?  ?  ? ?  ? ? ?Past Medical History:  ?Diagnosis Date  ? Heart failure ([place])   ? Hypertension   ? Polysubstance abuse ([place])   ? ? ?Past Surgical History:  ?Procedure Laterality Date  ? IR CT HEAD LTD  09/22/2021  ? IR PERCUTANEOUS ART THROMBECTOMY/INFUSION INTRACRANIAL INC DIAG ANGIO  09/20/2021  ? RADIOLOGY WITH ANESTHESIA N/A 09/20/2021  ? Procedure: IR WITH ANESTHESIA;  Surgeon: Radiologist, Medication, MD;  Location: [facility];  Service: Radiology;  Laterality: N/A;  ? ? ?There were no vitals filed for this visit. ? ? Subjective Assessment - 10/25/21 0932   ? ? Subjective "I'm alright, just sleepy"   ? Patient is accompained by: Family member   ? Currently in Pain? No/denies   ? ?  ?  ? ?  ? ? ? ? ? ? ? ? ADULT SLP TREATMENT - 10/25/21 0001   ? ?  ? General Information  ? Behavior/Cognition Alert;Cooperative;Distractible;Requires cueing;Decreased sustained attention;Agitated   ?  ? Cognitive-Linquistic Treatment  ? 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 Skilled Treatment Pt presents with decreased attention and participation from baseline of impaired. Frequently telling ST "you not talking to me" and putting head on hands or on table. Engage pt in simple naming task using music artists and actors, constraining naming to first letter provided. Pt able to generate  x8 with rare min A. Comprehension of spoken stimuli requires usual repeititions, even with use of visual supports. Likely 2/2 distractibility and reported tiredness. Education on alertness and fatigue impacting auditory comp and communication success.   ?  ? Assessment / Recommendations / Plan  ? Plan Continue with current plan of care   ?  ? Progression Toward Goals  ? Progression toward goals Progressing toward goals   ? ?  ?  ? ?  ? ? ? SLP Education - 10/25/21 0933   ? ? Education Details communication strategies, energy consevation, sleep hygiene   ? Person(s) Educated Patient;Spouse   ? Methods Explanation;Demonstration;Handout   ? Comprehension Verbalized understanding;Returned demonstration;Need further instruction   ? ?  ?  ? ?  ? ? ? SLP Short Term Goals - 10/25/21 0920   ? ?  ? SLP SHORT TERM GOAL #1  ? Title Pt will demsontrate auditory comprehension at word level with no more than 1 repetition with min A, 80% trials, across 2 sessions   ? Baseline 0/6 word level QAB when presented verbally   ? Time 2   ? Period Weeks   ? Status On-going   ?  ? SLP SHORT TERM GOAL #2  ? Title Spouse will teach back x2 novel communication supports to A pt in auditory comprehension at conversational level   ? Baseline using written  supports exclusively   ? Time 2   ? Period Weeks   ? Status On-going   ?  ? SLP SHORT TERM GOAL #3  ? Title Pt/SO will report successful implementation of x2 attention/memory compensations/supports at home with mod I across 2 sessions   ? Baseline not using compensations at home   ? Time 2   ? Period Weeks   ? Status On-going   ?  ? SLP SHORT TERM GOAL #4  ? Title Pt will accurately sort medications into pill box based on label instructions with occasional mod A   ? Baseline emerging skill reported at home, still mostly managed by care partner   ? Time 2   ? Period Weeks   ? Status On-going   ? ?  ?  ? ?  ? ? ? SLP Long Term Goals - 10/25/21 0921   ? ?  ? SLP LONG TERM GOAL #1  ? Title Pt will  demsontrate auditory comprehension at phrase level with no more than 1 repetition with min A, 80% trials, across 2 sessions   ? Baseline impaired at word level   ? Time 6   ? Period Weeks   ? Status On-going   ?  ? SLP LONG TERM GOAL #2  ? Title Spouse will report successful implementation of 2 types of communication supports to A pt in auditory comprehension at conversational level across 1 week period   ? Baseline using written supports exclusively   ? Time 6   ? Period Weeks   ? Status On-going   ?  ? SLP LONG TERM GOAL #3  ? Title Pt/SO will report successful implementation of x4 attention/memory compensations/supports at home with mod I across 1 week period   ? Baseline not using compensations at this time   ? Time 6   ? Period Weeks   ? Status On-going   ?  ? SLP LONG TERM GOAL #4  ? Title Pt will accurately manage medications with no missed doses reported > 2 weeks given rare min A from family/care partners/ caregiver   ? Baseline managed by care partner   ? Time 6   ? Period Weeks   ? Status On-going   ? ?  ?  ? ?  ? ? ? Plan - 10/25/21 0920   ? ? Clinical Impression Statement Continues to present with significantly impacted receptive language, and cognitive communication deficits in areas of attention and memory. Benefits from written cues and frequent redirection for attention during verbal communication. Pt conversational speech observed to be perseveratory on unrelated topics, telegraphic at times (e.g. "thanksgiving... family... desserts" IRT what's you're favorite holiday), has difficulty with topic mx even in simple coversation with visual supports. Pt expresses strong desire to return to work, currently unable to d/t language and cognitive impairments. Pt is beginning to participate more fully in ADL such as cooking, cleaning, medication management, schedule management, financial management. Requires supervision at this time for all ADLs. Recommend continued skilled ST services to address receptive  language and cognitive deficits.   ? Speech Therapy Frequency 2x / week   ? Duration 8 weeks   to account for scheduling  ? Treatment/Interventions Environmental controls;Cueing hierarchy;SLP instruction and feedback;Compensatory techniques;Cognitive reorganization;Functional tasks;Compensatory strategies;Internal/external aids;Multimodal communcation approach;Patient/family education   ? Potential to [place]   ? Potential Considerations Severity of impairments   ? Consulted and Agree with Plan of Care Patient;Family member/caregiver   ? Family Member Consulted SO,  Moesha   ? ?  ?  ? ?  ? ? ?Patient will benefit from skilled therapeutic intervention in order to improve the following deficits and impairments:   ?Cognitive communication deficit ? ?Receptive language impairment ? ? ? ?Problem List ?Patient Active Problem List  ? Diagnosis Date Noted  ? Acute ischemic stroke ([place]) 09/20/2021  ? 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 CVA (cerebral vascular accident) ([place]) 09/20/2021  ? Acute on chronic diastolic congestive heart failure, NYHA class 3 ([place]) 09/01/2021  ? Iron deficiency anemia 08/07/2021  ? Multiple pulmonary emboli ([place]) 08/06/2021  ? Acute on chronic HFrEF (heart failure with reduced ejection fraction) ([place]) 08/06/2021  ? Acute deep vein thrombosis (DVT) of right lower extremity ([place]) 08/06/2021  ? Anemia 08/06/2021  ? ? ?Su Monks, CF-SLP ?10/25/2021, 9:38 AM ? ?[facility] ?[place] ?[place], [place], [address] ?Phone: [phone]   Fax:  [phone] ? ? ?Name: Carlos Mckinney ?MRN: [place]:[id number] ?Date of Birth: [birth year] ? ?

## 2021-10-25 NOTE — Patient Instructions (Addendum)
[email or web address] ? ? ?Sleep Hygiene:  ?Avoid screen time before bed ?Consider blue light glasses ?Sleep in a dark room  ?Should be cool in room ?Stick to a sleep schedule  ? ?Energy Conservation Techniques ? ?Determine the necessity of performing the task.  Simplify those tasks that are necessary.  (Get clothes out of the dryer when they?re warm instead of ironing, let dishes air dry, etc.) ? ?Take frequent rests both during and between activities.  Avoid repetitive tasks. ? ?Pre-plan your activities; try a daily and/or weekly schedule.  Spread out the activities that are most fatiguing (break up cleaning tasks over multiple days). ? ? Remember to plan a balance of work, rest and recreation. ? ?Consider the best time for each activity.  Do the most exertive task when you have the most energy. ? ?

## 2021-10-26 ENCOUNTER — Encounter (HOSPITAL_COMMUNITY): Payer: Medicaid Other

## 2021-10-27 ENCOUNTER — Ambulatory Visit: Payer: Medicaid Other | Admitting: Speech Pathology

## 2021-10-27 DIAGNOSIS — F802 Mixed receptive-expressive language disorder: Secondary | ICD-10-CM

## 2021-10-27 DIAGNOSIS — R41841 Cognitive communication deficit: Secondary | ICD-10-CM | POA: Diagnosis not present

## 2021-10-27 DIAGNOSIS — M6281 Muscle weakness (generalized): Secondary | ICD-10-CM | POA: Diagnosis not present

## 2021-10-27 NOTE — Therapy (Signed)
[place] ?[place] ?[place], [place], [address] ?Phone: [phone]   Fax:  [phone] ? ?Speech Language Pathology Treatment ? ?Patient Details  ?Name: Carlos Mckinney ?MRN: [id number] ?Date of Birth: [birth year] ?Referring Provider (SLP): Bo Merino, NP ? ? ?Encounter Date: 10/27/2021 ? ? End of Session - 10/27/21 1003   ? ? Visit Number 6   ? Number of Visits 13   ? Date for SLP Re-Evaluation 11/25/21   ? Authorization Type medicare pending   ? SLP Start Time 0845   ? SLP Stop Time  0925   ? SLP Time Calculation (min) 40 min   ? Activity Tolerance Patient tolerated treatment well   ? ?  ?  ? ?  ? ? ?Past Medical History:  ?Diagnosis Date  ? Heart failure ([place])   ? Hypertension   ? Polysubstance abuse ([place])   ? ? ?Past Surgical History:  ?Procedure Laterality Date  ? IR CT HEAD LTD  09/22/2021  ? IR PERCUTANEOUS ART THROMBECTOMY/INFUSION INTRACRANIAL INC DIAG ANGIO  09/20/2021  ? RADIOLOGY WITH ANESTHESIA N/A 09/20/2021  ? Procedure: IR WITH ANESTHESIA;  Surgeon: Radiologist, Medication, MD;  Location: [facility];  Service: Radiology;  Laterality: N/A;  ? ? ?There were no vitals filed for this visit. ? ? Subjective Assessment - 10/27/21 0848   ? ? Subjective "I still can't really hear when I'm watching TV."   ? Patient is accompained by: Family member   ? Currently in Pain? No/denies   ? ?  ?  ? ?  ? ? ? ? ? ? ? ? ADULT SLP TREATMENT - 10/27/21 0956   ? ?  ? General Information  ? Behavior/Cognition Alert;Cooperative;Distractible;Requires cueing;Decreased sustained attention;Agitated;Uncooperative   ?  ? Cognitive-Linquistic Treatment  ? 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 Skilled Treatment Presents for ST session initially with improved affect from previous session, however demonstrates low frsutration tolerance as session progressed. ST provides education on using closed captions for TV watching and differences in conversation vs. movies and  why movies may be more difficulty to ocmprehend than personal conversations. Use of visual supports to explain. At this time, pt demonstrates frsutration and declines to participate in conversation. SO reports pt with decreased frsutration tolerance over previous fw days observed at home. Provided education and coaching on implementing previously taught communication strategies when this occurs to engage and encourage pt in conversational exchanges. SO endorses understanding verbally. ST targets auditory comprehension in conversation of perferred, pt led topic with success. Pt requiring repeititons ~50% of trials, ST using shortened phrases, intermittant visual aid, and ensuring pt eyes on ST with success. Increased comprehension demonstrated of short phrases given context of pt led conversation.   ?  ? Assessment / Recommendations / Plan  ? Plan Continue with current plan of care   ?  ? Progression Toward Goals  ? Progression toward goals Progressing toward goals   ? ?  ?  ? ?  ? ? ? ? ? SLP Short Term Goals - 10/27/21 1004   ? ?  ? SLP SHORT TERM GOAL #1  ? Title Pt will demsontrate auditory comprehension at word level with no more than 1 repetition with min A, 80% trials, across 2 sessions   ? Baseline 0/6 word level QAB when presented verbally   ? Time 2   ? Period Weeks   ? Status On-going   ?  ? SLP SHORT TERM GOAL #2  ?  Title Spouse will teach back x2 novel communication supports to A pt in auditory comprehension at conversational level   ? Baseline using written supports exclusively   ? Time 2   ? Period Weeks   ? Status On-going   ?  ? SLP SHORT TERM GOAL #3  ? Title Pt/SO will report successful implementation of x2 attention/memory compensations/supports at home with mod I across 2 sessions   ? Baseline not using compensations at home   ? Time 2   ? Period Weeks   ? Status On-going   ?  ? SLP SHORT TERM GOAL #4  ? Title Pt will accurately sort medications into pill box based on label instructions with  occasional mod A   ? Baseline emerging skill reported at home, still mostly managed by care partner   ? Time 2   ? Period Weeks   ? Status On-going   ? ?  ?  ? ?  ? ? ? SLP Long Term Goals - 10/27/21 1004   ? ?  ? SLP LONG TERM GOAL #1  ? Title Pt will demsontrate auditory comprehension at phrase level with no more than 1 repetition with min A, 80% trials, across 2 sessions   ? Baseline impaired at word level   ? Time 6   ? Period Weeks   ? Status On-going   ?  ? SLP LONG TERM GOAL #2  ? Title Spouse will report successful implementation of 2 types of communication supports to A pt in auditory comprehension at conversational level across 1 week period   ? Baseline using written supports exclusively   ? Time 6   ? Period Weeks   ? Status On-going   ?  ? SLP LONG TERM GOAL #3  ? Title Pt/SO will report successful implementation of x4 attention/memory compensations/supports at home with mod I across 1 week period   ? Baseline not using compensations at this time   ? Time 6   ? Period Weeks   ? Status On-going   ?  ? SLP LONG TERM GOAL #4  ? Title Pt will accurately manage medications with no missed doses reported > 2 weeks given rare min A from family/care partners/ caregiver   ? Baseline managed by care partner   ? Time 6   ? Period Weeks   ? Status On-going   ? ?  ?  ? ?  ? ? ? Plan - 10/27/21 1003   ? ? Clinical Impression Statement Continues to present with significantly impacted receptive language, and cognitive communication deficits in areas of attention and memory. Benefits from written cues and frequent redirection for attention during verbal communication. Pt conversational speech observed to be perseveratory on unrelated topics, telegraphic at times (e.g. "thanksgiving... family... desserts" IRT what's you're favorite holiday), has difficulty with topic mx even in simple coversation with visual supports. Pt expresses strong desire to return to work, currently unable to d/t language and cognitive impairments.  Pt is beginning to participate more fully in ADL such as cooking, cleaning, medication management, schedule management, financial management. Requires supervision at this time for all ADLs. Recommend continued skilled ST services to address receptive language and cognitive deficits.   ? Speech Therapy Frequency 2x / week   ? Duration 8 weeks   to account for scheduling  ? Treatment/Interventions Environmental controls;Cueing hierarchy;SLP instruction and feedback;Compensatory techniques;Cognitive reorganization;Functional tasks;Compensatory strategies;Internal/external aids;Multimodal communcation approach;Patient/family education   ? Potential to [place]   ?  Potential Considerations Severity of impairments   ? Consulted and Agree with Plan of Care Patient;Family member/caregiver   ? Family Member Consulted SO, Moesha   ? ?  ?  ? ?  ? ? ?Patient will benefit from skilled therapeutic intervention in order to improve the following deficits and impairments:   ?Cognitive communication deficit ? ?Receptive language impairment ? ? ? ?Problem List ?Patient Active Problem List  ? Diagnosis Date Noted  ? Acute ischemic stroke ([place]) 09/20/2021  ? 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 CVA (cerebral vascular accident) ([place]) 09/20/2021  ? Acute on chronic diastolic congestive heart failure, NYHA class 3 ([place]) 09/01/2021  ? Iron deficiency anemia 08/07/2021  ? 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 Acute on chronic HFrEF (heart failure with reduced ejection fraction) ([place]) 08/06/2021  ? Acute deep vein thrombosis (DVT) of right lower extremity ([place]) 08/06/2021  ? Anemia 08/06/2021  ? ? ?Su Monks, CF-SLP ?10/27/2021, 10:06 AM ? ?[place] ?[place] ?[place], [place], [address] ?Phone: [phone]   Fax:  [phone] ? ? ?Name: Carlos Mckinney ?MRN: [id number] ?Date of Birth: 08-14-1995 ? ?

## 2021-10-27 NOTE — Patient Instructions (Signed)
Watching Movies: ? ?Continue to use closed captions. Reading what you're hearing helps your brain strengthen the muscle that understands things you hear.  ? ? ?Movies are harder to understand (or hear) than personal conversations with friends/family.  ? ?Movies are fast paced and they don't repeat. ? ?Friends and family can slow down and say again.  ?

## 2021-11-01 ENCOUNTER — Ambulatory Visit: Payer: Medicaid Other | Attending: Nurse Practitioner | Admitting: Speech Pathology

## 2021-11-01 DIAGNOSIS — I639 Cerebral infarction, unspecified: Secondary | ICD-10-CM | POA: Insufficient documentation

## 2021-11-01 DIAGNOSIS — H9193 Unspecified hearing loss, bilateral: Secondary | ICD-10-CM | POA: Insufficient documentation

## 2021-11-03 ENCOUNTER — Ambulatory Visit: Payer: Medicaid Other | Admitting: Speech Pathology

## 2021-11-03 ENCOUNTER — Telehealth: Payer: Self-pay | Admitting: Speech Pathology

## 2021-11-08 ENCOUNTER — Ambulatory Visit: Payer: Medicaid Other | Admitting: Speech Pathology

## 2021-11-10 ENCOUNTER — Ambulatory Visit: Payer: Medicaid Other | Admitting: Speech Pathology

## 2021-11-10 NOTE — Telephone Encounter (Signed)
Spoke with pt's SO, Moesha regarding missed visits. Advised of pt's upcoming visits and that should pt not attend those 2 scheduled visits he would be d/c. She verbalized understanding.  ?

## 2021-11-15 ENCOUNTER — Other Ambulatory Visit: Payer: Self-pay | Admitting: Nurse Practitioner

## 2021-11-15 ENCOUNTER — Telehealth: Payer: Self-pay | Admitting: Nurse Practitioner

## 2021-11-15 ENCOUNTER — Other Ambulatory Visit (HOSPITAL_COMMUNITY): Payer: Self-pay

## 2021-11-15 DIAGNOSIS — H9190 Unspecified hearing loss, unspecified ear: Secondary | ICD-10-CM

## 2021-11-15 NOTE — Telephone Encounter (Signed)
Ms. Carlos Mckinney called regarding the patient's hearing. She said he is having trouble hearing and would like to speak to the provider about getting a hearing test. ?[phone] ?

## 2021-11-21 ENCOUNTER — Ambulatory Visit: Payer: Medicaid Other | Admitting: Audiologist

## 2021-11-21 DIAGNOSIS — H9193 Unspecified hearing loss, bilateral: Secondary | ICD-10-CM | POA: Diagnosis not present

## 2021-11-21 DIAGNOSIS — I639 Cerebral infarction, unspecified: Secondary | ICD-10-CM | POA: Diagnosis not present

## 2021-11-21 NOTE — Procedures (Signed)
?Outpatient Audiology and [facility] ?[address] ?[place],   [address] ?[phone] ? ?AUDIOLOGICAL  EVALUATION ? ?NAME: Carlos Mckinney     ?DOB:   [birth year]      ?MRN: [id number]                                                                                     ?DATE: 11/21/2021     ?REFERENT: Teena Dunk, NP ?STATUS: Outpatient ?DIAGNOSIS: Normal Hearing, Stroke  ? ? ?History: ?Carlos Mckinney was seen for an audiological evaluation. Carlos Mckinney was accompanied to the appointment by his caregiver.  ?Carlos Mckinney is receiving a hearing evaluation due to concerns for difficulty hearing people every since his stroke. Carlos Mckinney had bilateral embolism MCA on 09/20/21.  Carlos Mckinney says the hearing loss comes and goes, its worse some days. Carlos Mckinney has difficulty hearing people all the time. This difficulty began suddenly after his stroke. He was never had a hearing test before. No pain or pressure reported in either ear. Carlos Mckinney say his ears are often itchy. Tinnitus denied for both ears. Carlos Mckinney was receiving speech therapy but stopped going.  Carlos Mckinney has difficulty answering all case history, used yes/no questions only.  ?Medical history positive for recent stroke which is a risk factor for hearing loss. No other relevant case history reported.  ? ?Evaluation:  ?Otoscopy showed a partial view of the tympanic membranes with cerumen covering most of his canals, bilaterally ?Tympanometry results were consistent with normal middle ear pressure, bilaterally   ?Distortion Product Otoacoustic Emissions (DPOAE's) were present 1.5k-12k Hz, bilaterally  except for 12kHz in the left right ear. This was performed as an objective measure of hearing to confirm pure tone testing.  ?Audiometric testing was completed using conventional audiometry with supraural transducer. Speech Recognition Thresholds were consistent with pure tone averages, 20dB in each ear. Word Recognition was 100% using slowly presentation of live voice  words at 60dB. Carlos Mckinney conditioend hand over hand for testing. Pure tone thresholds show normal hearing in both ears. Test results are consistent with normal hearing with slow processing of speech. Testing reliable.  ? ?Results:  ?The test results were reviewed with [facility] and his caregiver. Carlos Mckinney has normal hearing.  He understood 100% of the words I asked him to repeat, but I had to say them slowly and give him several extra seconds to repeat. The stroke has impacted his ability to efficiently understand what he hears. Carlos Mckinney had difficulty understanding that the damage was to his brain, not hit ears. He was counseled on the difference between hearing and not understanding. The stroke did not cause hearing loss. Carlos Mckinney needs people to slow their rate of speech and ask him short questions. This will help increase his understanding. Caregiver agreed.  ?Recommend Carlos Mckinney restart therapies like his speech therapy. The therapies will help him to recover some of what he has lost in his ability to understand. Carlos Mckinney does not need hearing aids, they are not recommended.  ? ?Recommendations: ?No further audiologic testing is needed unless future hearing concerns arise. Hearing normal.  ?Recommend restarting speech therapy.  ?Remove wax from ears. Debrox Earwax Removal Drops are a safe and inexpensive in-home solution  for wax removal.  ?  ?How to use the Debrox Earwax Removal Drops Kit:  ?tilt head sideways. ?place 5 to 10 drops into ear. ?tip of applicator should not enter ear canal. ?keep drops in ear for several minutes by keeping head tilted or placing cotton in the ear. ?use twice daily for up to four days  ?gently flush ear with water, using soft rubber bulb syringe after final treatment (on 4th day)   ? ?  ?Carlos Mckinney  ?Audiologist, Au.D., CCC-A ?11/21/2021  10:57 AM ? ?Cc: Teena Dunk, NP  ?

## 2021-11-28 ENCOUNTER — Inpatient Hospital Stay: Payer: MEDICAID | Admitting: Neurology

## 2021-11-28 ENCOUNTER — Encounter: Payer: Self-pay | Admitting: Neurology

## 2021-11-30 ENCOUNTER — Other Ambulatory Visit (HOSPITAL_COMMUNITY): Payer: Self-pay

## 2021-12-08 ENCOUNTER — Encounter (HOSPITAL_COMMUNITY): Payer: Medicaid Other | Admitting: Internal Medicine

## 2021-12-27 ENCOUNTER — Ambulatory Visit: Payer: Medicaid Other | Admitting: Nurse Practitioner

## 2021-12-30 ENCOUNTER — Other Ambulatory Visit: Payer: Self-pay

## 2021-12-30 NOTE — Patient Outreach (Signed)
[facility]) Care Management  12/30/2021  WAI LITT May 28, 1996 [phone]   Telephone outreach to patient to obtain mRS was successfully completed. MRS= 3  Vanice Sarah [facility] Care Management Assistant [phone]

## 2022-01-06 ENCOUNTER — Other Ambulatory Visit (HOSPITAL_COMMUNITY): Payer: Self-pay

## 2022-01-20 ENCOUNTER — Encounter (HOSPITAL_COMMUNITY): Payer: Self-pay | Admitting: Internal Medicine

## 2022-01-20 ENCOUNTER — Ambulatory Visit (HOSPITAL_COMMUNITY)
Admission: RE | Admit: 2022-01-20 | Discharge: 2022-01-20 | Disposition: A | Discharge status: Home / Self Care | Payer: Medicaid Other | Source: Ambulatory Visit | Attending: Internal Medicine | Admitting: Internal Medicine

## 2022-01-20 VITALS — BP 100/60 | HR 106 | Wt 181.2 lb

## 2022-01-20 DIAGNOSIS — F17200 Nicotine dependence, unspecified, uncomplicated: Secondary | ICD-10-CM | POA: Insufficient documentation

## 2022-01-20 DIAGNOSIS — I69398 Other sequelae of cerebral infarction: Secondary | ICD-10-CM | POA: Diagnosis not present

## 2022-01-20 DIAGNOSIS — Z7984 Long term (current) use of oral hypoglycemic drugs: Secondary | ICD-10-CM | POA: Diagnosis not present

## 2022-01-20 DIAGNOSIS — H918X9 Other specified hearing loss, unspecified ear: Secondary | ICD-10-CM | POA: Insufficient documentation

## 2022-01-20 DIAGNOSIS — Z7901 Long term (current) use of anticoagulants: Secondary | ICD-10-CM | POA: Diagnosis not present

## 2022-01-20 DIAGNOSIS — Z7982 Long term (current) use of aspirin: Secondary | ICD-10-CM | POA: Diagnosis not present

## 2022-01-20 DIAGNOSIS — Z86718 Personal history of other venous thrombosis and embolism: Secondary | ICD-10-CM | POA: Insufficient documentation

## 2022-01-20 DIAGNOSIS — J90 Pleural effusion, not elsewhere classified: Secondary | ICD-10-CM | POA: Diagnosis not present

## 2022-01-20 DIAGNOSIS — I5022 Chronic systolic (congestive) heart failure: Secondary | ICD-10-CM | POA: Insufficient documentation

## 2022-01-20 DIAGNOSIS — I5032 Chronic diastolic (congestive) heart failure: Secondary | ICD-10-CM

## 2022-01-20 DIAGNOSIS — Z86711 Personal history of pulmonary embolism: Secondary | ICD-10-CM | POA: Insufficient documentation

## 2022-01-20 DIAGNOSIS — Z79899 Other long term (current) drug therapy: Secondary | ICD-10-CM | POA: Diagnosis not present

## 2022-01-20 DIAGNOSIS — R0683 Snoring: Secondary | ICD-10-CM | POA: Diagnosis not present

## 2022-01-20 DIAGNOSIS — Z720 Tobacco use: Secondary | ICD-10-CM | POA: Diagnosis not present

## 2022-01-20 LAB — BASIC METABOLIC PANEL
Anion gap: 8 (ref 5–15)
BUN: 5 mg/dL — ABNORMAL LOW (ref 6–20)
CO2: 28 mmol/L (ref 22–32)
Calcium: 9.7 mg/dL (ref 8.9–10.3)
Chloride: 106 mmol/L (ref 98–111)
Creatinine, Ser: 1.19 mg/dL (ref 0.61–1.24)
GFR, Estimated: >60 mL/min (ref >60)
Glucose, Bld: 93 mg/dL (ref 70–99)
Potassium: 4.6 mmol/L (ref 3.5–5.1)
Sodium: 142 mmol/L (ref 135–145)

## 2022-01-20 LAB — CBC
HCT: 49.1 % (ref 39.0–52.0)
Hemoglobin: 16 g/dL (ref 13.0–17.0)
MCH: 32.3 pg (ref 26.0–34.0)
MCHC: 32.6 g/dL (ref 30.0–36.0)
MCV: 99.2 fL (ref 80.0–100.0)
Platelets: 222 10*3/uL (ref 150–400)
RBC: 4.95 MIL/uL (ref 4.22–5.81)
RDW: 15 % (ref 11.5–15.5)
WBC: 3 10*3/uL — ABNORMAL LOW (ref 4.0–10.5)
nRBC: 0 % (ref 0.0–0.2)

## 2022-01-20 LAB — DIGOXIN LEVEL: Digoxin Level: 0.3 ng/mL — ABNORMAL LOW (ref 0.8–2.0)

## 2022-01-20 LAB — BRAIN NATRIURETIC PEPTIDE: B Natriuretic Peptide: 993.8 pg/mL — ABNORMAL HIGH (ref 0.0–100.0)

## 2022-01-24 ENCOUNTER — Inpatient Hospital Stay: Payer: MEDICAID | Admitting: Neurology

## 2022-02-08 ENCOUNTER — Inpatient Hospital Stay: Payer: MEDICAID | Admitting: Neurology

## 2022-02-08 ENCOUNTER — Encounter: Payer: Self-pay | Admitting: Neurology

## 2022-03-09 ENCOUNTER — Other Ambulatory Visit (HOSPITAL_COMMUNITY): Payer: Self-pay | Admitting: Adult Health

## 2022-03-09 ENCOUNTER — Other Ambulatory Visit (HOSPITAL_COMMUNITY): Payer: Self-pay

## 2022-03-09 MED ORDER — FUROSEMIDE 40 MG PO TABS
40.0000 mg | ORAL_TABLET | Freq: Every day | ORAL | 3 refills | Status: DC
  Filled 2022-03-09: qty 30, 30d supply, fill #0
  Filled 2022-04-19: qty 30, 30d supply, fill #1
  Filled 2022-05-08: qty 30, 30d supply, fill #2

## 2022-03-10 ENCOUNTER — Other Ambulatory Visit (HOSPITAL_COMMUNITY): Payer: Self-pay

## 2022-03-12 ENCOUNTER — Emergency Department (HOSPITAL_COMMUNITY)
Admission: EM | Admit: 2022-03-12 | Discharge: 2022-03-12 | Disposition: A | Discharge status: Home / Self Care | Payer: Commercial Managed Care - HMO | Attending: Emergency Medicine | Admitting: Emergency Medicine

## 2022-03-12 ENCOUNTER — Encounter (HOSPITAL_COMMUNITY): Payer: Self-pay | Admitting: *Deleted

## 2022-03-12 ENCOUNTER — Emergency Department (HOSPITAL_COMMUNITY): Payer: Commercial Managed Care - HMO

## 2022-03-12 ENCOUNTER — Other Ambulatory Visit: Payer: Self-pay

## 2022-03-12 DIAGNOSIS — J029 Acute pharyngitis, unspecified: Secondary | ICD-10-CM | POA: Diagnosis not present

## 2022-03-12 DIAGNOSIS — R410 Disorientation, unspecified: Secondary | ICD-10-CM | POA: Diagnosis not present

## 2022-03-12 LAB — CBC
HCT: 39.7 % (ref 39.0–52.0)
Hemoglobin: 13.4 g/dL (ref 13.0–17.0)
MCH: 32.4 pg (ref 26.0–34.0)
MCHC: 33.8 g/dL (ref 30.0–36.0)
MCV: 95.9 fL (ref 80.0–100.0)
Platelets: 175 10*3/uL (ref 150–400)
RBC: 4.14 MIL/uL — ABNORMAL LOW (ref 4.22–5.81)
RDW: 13.5 % (ref 11.5–15.5)
WBC: 5.1 10*3/uL (ref 4.0–10.5)
nRBC: 0 % (ref 0.0–0.2)

## 2022-03-12 LAB — COMPREHENSIVE METABOLIC PANEL
ALT: 13 U/L (ref 0–44)
AST: 26 U/L (ref 15–41)
Albumin: 4.2 g/dL (ref 3.5–5.0)
Alkaline Phosphatase: 49 U/L (ref 38–126)
Anion gap: 10 (ref 5–15)
BUN: 10 mg/dL (ref 6–20)
CO2: 24 mmol/L (ref 22–32)
Calcium: 9.4 mg/dL (ref 8.9–10.3)
Chloride: 101 mmol/L (ref 98–111)
Creatinine, Ser: 1.2 mg/dL (ref 0.61–1.24)
GFR, Estimated: >60 mL/min (ref >60)
Glucose, Bld: 96 mg/dL (ref 70–99)
Potassium: 3.6 mmol/L (ref 3.5–5.1)
Sodium: 135 mmol/L (ref 135–145)
Total Bilirubin: 0.9 mg/dL (ref 0.3–1.2)
Total Protein: 7.1 g/dL (ref 6.5–8.1)

## 2022-03-12 LAB — CBG MONITORING, ED: Glucose-Capillary: 95 mg/dL (ref 70–99)

## 2022-03-12 LAB — RAPID URINE DRUG SCREEN, HOSP PERFORMED
Amphetamines: NOT DETECTED
Barbiturates: NOT DETECTED
Benzodiazepines: NOT DETECTED
Cocaine: NOT DETECTED
Opiates: NOT DETECTED
Tetrahydrocannabinol: POSITIVE — AB

## 2022-03-12 LAB — SALICYLATE LEVEL: Salicylate Lvl: <7 mg/dL — ABNORMAL LOW (ref 7.0–30.0)

## 2022-03-12 LAB — ACETAMINOPHEN LEVEL: Acetaminophen (Tylenol), Serum: <10 ug/mL — ABNORMAL LOW (ref 10–30)

## 2022-03-12 LAB — ETHANOL: Alcohol, Ethyl (B): <10 mg/dL (ref <10)

## 2022-03-12 NOTE — ED Triage Notes (Signed)
Pt arrives from home via [facility]. Throat pain since Friday "feels like something is stuck". A/o x 4. En route 110/80, 99%, hr 102, cbg 90

## 2022-03-12 NOTE — ED Provider Triage Note (Signed)
  Emergency Medicine Provider Triage Evaluation Note  MRN:  [id number]  Arrival date & time: 03/12/22    Medically screening exam initiated at 2:20 AM.   CC:     HPI:  Carlos Mckinney is a 26 y.o. year-old male presents to the ED with chief complaint of sore throat.  SO also reports that he has been acting weird and wonders if he was drugged or is on drugs.  Patient unable or refuses to give a clear history.  History provided by patient. ROS:  -As included in HPI PE:   Vitals:   03/12/22 0127  BP: 108/63  Pulse: 88  Resp: 16  Temp: 98.3 F (36.8 C)  SpO2: 100%    Non-toxic appearing No respiratory distress Flat/bizarre affect MDM:  Patient seems confused.  Hx of polysubstance abuse and also stroke. I've ordered AMS workup in triage to expedite lab/diagnostic workup.  Patient was informed that the remainder of the evaluation will be completed by another provider, this initial triage assessment does not replace that evaluation, and the importance of remaining in the ED until their evaluation is complete.    Roxy Horseman, PA-C 03/12/22 [phone]

## 2022-03-12 NOTE — ED Triage Notes (Addendum)
Pt holding his head, says "I have a lot of frustrations in my brain, I am here to protect myself and my kids" When asked how we could help him tonight, he says " I just need to relax and breathe". Pt is alert, responds to oreintation questions appropriately, denies HI or SI. Says he just "needs to talk to someone". Admits to ETOH, marijuana use tonight. Also says his throat has been bothering him, requesting water, which he says would help

## 2022-03-12 NOTE — ED Notes (Signed)
Pt mildly confused and forgetful but redirectable. This RN updated pt and family regarding plan of care. Toileting and food offered

## 2022-03-12 NOTE — ED Notes (Signed)
This RN discussed AMA form with pt and family. Both verbalized understanding and had no further questions.

## 2022-03-12 NOTE — ED Notes (Signed)
Pt wife arrived to triage, stating the patients main complaint is sore throat. Reporting he has had strokes before and some times does not explain himself well. However, she states that he went out last Friday with his friends and has not been acting himself since, says he was "way worse when he came home, but its a little better now, just not acting himself".

## 2022-03-12 NOTE — ED Notes (Signed)
Family spoke with this RN stating that the pt wants to be discharged. This RN educated pt and family on the importance of staying for MRI. Pt and family insisted on leaving and agreed to sign AMA form. Dahlia Client PA made aware

## 2022-03-12 NOTE — ED Provider Notes (Signed)
[facility] Emergency Department Provider Note MRN:  [id number]  Arrival date & time: 03/12/22     Chief Complaint   Sore Throat  History of Present Illness   [name] is a 26 y.o. year-old male presents to the ED with chief complaint of sore throat.  Patient states that he has had a sore throat for the past day or so.    Patient's SO is concerned that he is on drugs.  She states that he has been acting differently this week.  He has history prior stroke history.  He denies numbness, weakness.  SO states that he has had some confusion.  History provided by patient. And significant other as above.   Review of Systems  Pertinent review of systems noted in HPI.    Physical Exam   Vitals:   03/12/22 0127 03/12/22 0541  BP: 108/63 (!) 102/57  Pulse: 88 74  Resp: 16 18  Temp: 98.3 F (36.8 C) 98.5 F (36.9 C)  SpO2: 100% 100%    CONSTITUTIONAL:  non-toxic-appearing, NAD NEURO:  Alert and oriented x 3, CN 3-12 grossly intact EYES:  eyes equal and reactive ENT/NECK:  Supple, no stridor, no oropharyngeal swelling, erythema, or abscess CARDIO:  normal rate, regular rhythm, appears well-perfused  PULM:  No respiratory distress GI/GU:  non-distended,  MSK/SPINE:  No gross deformities, no edema, moves all extremities  SKIN:  no rash, atraumatic   *Additional and/or pertinent findings included in MDM below  Diagnostic and Interventional Summary     Labs Reviewed  CBC - Abnormal; Notable for the following components:      Result Value   RBC 4.14 (*)    All other components within normal limits  RAPID URINE DRUG SCREEN, HOSP PERFORMED - Abnormal; Notable for the following components:   Tetrahydrocannabinol POSITIVE (*)    All other components within normal limits  ACETAMINOPHEN LEVEL - Abnormal; Notable for the following components:   Acetaminophen (Tylenol), Serum <10 (*)    All other components within normal limits  SALICYLATE LEVEL -  Abnormal; Notable for the following components:   Salicylate Lvl <7.0 (*)    All other components within normal limits  COMPREHENSIVE METABOLIC PANEL  ETHANOL  CBG MONITORING, ED    CT HEAD WO CONTRAST ( )  Final Result    MR BRAIN WO CONTRAST    (Results Pending)    Medications - No data to display   Procedures  /  Critical Care Procedures  ED Course and Medical Decision Making  I have reviewed the triage vital signs, the nursing notes, and pertinent available records from the EMR.  Social Determinants Affecting Complexity of Care: Patient suffers from drug abuse/addiction.   ED Course:    Medical Decision Making Patient here with episodes of mild intermittent confusion as well as sore throat.  His significant other is concerned that he is on drugs.  He does have history of stroke.  Will check labs and imaging and reassess patient.  Labs are mostly reassuring.  UDS is notable for cannabis.  CT shows questionable new infarcts, but MRI is needed for further clarification.  I discussed these findings with the patient and his significant other, who are agreeable with plan for MRI.  Amount and/or Complexity of Data Reviewed Labs: ordered. Radiology: ordered.     Consultants: No consultations were needed in caring for this patient.   Treatment and Plan: I considered admission due to patient's initial presentation, but after considering the  examination and diagnostic results, patient will not require admission and can be discharged with outpatient follow-up.  Patient discussed with attending physician, Dr. Wallace Cullens.  6:14 AM Patient requesting discharge.  He doesn't want the MRI.  I made it very clear that we can't rule out stroke(s) without the MRI.  Final Clinical Impressions(s) / ED Diagnoses     ICD-10-CM   1. Confusion  R41.0     2. Sore throat  J02.9       ED Discharge Orders     None         Discharge Instructions Discussed with and Provided to  Patient:   Discharge Instructions   None      Roxy Horseman, PA-C 03/12/22 6122    Sloan Leiter, DO 03/14/22 [phone]

## 2022-03-13 ENCOUNTER — Telehealth (HOSPITAL_COMMUNITY): Payer: Self-pay | Admitting: Internal Medicine

## 2022-03-13 ENCOUNTER — Other Ambulatory Visit (HOSPITAL_COMMUNITY): Payer: Self-pay | Admitting: *Deleted

## 2022-03-13 ENCOUNTER — Telehealth (HOSPITAL_COMMUNITY): Payer: Self-pay | Admitting: Pharmacy Technician

## 2022-03-13 ENCOUNTER — Other Ambulatory Visit (HOSPITAL_COMMUNITY): Payer: Self-pay

## 2022-03-13 MED ORDER — EMPAGLIFLOZIN 10 MG PO TABS
10.0000 mg | ORAL_TABLET | Freq: Every day | ORAL | 11 refills | Status: DC
  Filled 2022-03-13: qty 30, 30d supply, fill #0

## 2022-03-13 MED ORDER — DAPAGLIFLOZIN PROPANEDIOL 10 MG PO TABS
10.0000 mg | ORAL_TABLET | Freq: Every day | ORAL | 6 refills | Status: DC
  Filled 2022-03-13: qty 30, 30d supply, fill #0
  Filled 2022-04-19 (×2): qty 30, 30d supply, fill #1
  Filled 2022-05-08 – 2022-05-11 (×3): qty 30, 30d supply, fill #2

## 2022-03-13 NOTE — Telephone Encounter (Signed)
Advanced Heart Failure Patient Advocate Encounter  Prior Authorization for Xarelto has been submitted and approved.    PA# [id number] Effective dates: 03/13/22 through 03/13/23  Patients co-pay is $54.03 (30 days)   Emailed patient's wife co-pay card link.  Archer Asa, CPhT

## 2022-03-13 NOTE — Telephone Encounter (Signed)
Pt need refill for Jardiance and Xarelto, pt is out of meds completely, please send script to [facility], pt stated she called last Thursday and Friday, please advise

## 2022-03-13 NOTE — Telephone Encounter (Signed)
Pt needs to contact the pharmacy. Both scripts have refills at [facility].

## 2022-03-13 NOTE — Telephone Encounter (Signed)
Advanced Heart Failure Patient Advocate Encounter  Received a PA request for Jardiance from [facility]. Upon further review, patient's insurance prefers [place]. No [place] PA required, 30 day co-pay $25. Called and spoke with patient's wife. She is aware of the change. Sent her co-pay card link as well. Should be $0 with co-pay card.  Sent 30 day RX request to [facility] ([facility] to send to [facility] outpatient.   Archer Asa, CPhT

## 2022-03-13 NOTE — Telephone Encounter (Signed)
Xarelto is in process of being filled, Jardiance refill sent

## 2022-03-14 ENCOUNTER — Other Ambulatory Visit (HOSPITAL_COMMUNITY): Payer: Self-pay

## 2022-03-15 ENCOUNTER — Other Ambulatory Visit (HOSPITAL_COMMUNITY): Payer: Self-pay

## 2022-03-22 ENCOUNTER — Ambulatory Visit: Payer: Medicaid Other | Admitting: Nurse Practitioner

## 2022-04-04 ENCOUNTER — Other Ambulatory Visit (HOSPITAL_COMMUNITY): Payer: Self-pay

## 2022-04-04 ENCOUNTER — Telehealth (HOSPITAL_COMMUNITY): Payer: Self-pay

## 2022-04-04 NOTE — Telephone Encounter (Signed)
Patient Advocate Encounter  Received a patient assistance renewal application for Entresto. Assistance program is NOT being renewed at this time, as the patient has new insurance and is eligible for savings card. Patient was provided with access to co-pay assistance.  Confirmed Carlos Mckinney and Xarelto savings cards on file are current and active. Emailed patient's wife link for [place] savings card if needed.  [name], CPhT Rx Patient Advocate Phone: [phone]

## 2022-04-18 ENCOUNTER — Other Ambulatory Visit (HOSPITAL_COMMUNITY): Payer: Self-pay

## 2022-04-19 ENCOUNTER — Other Ambulatory Visit (HOSPITAL_COMMUNITY): Payer: Self-pay

## 2022-04-20 ENCOUNTER — Other Ambulatory Visit (HOSPITAL_COMMUNITY): Payer: Self-pay

## 2022-04-25 ENCOUNTER — Other Ambulatory Visit (HOSPITAL_COMMUNITY): Payer: Self-pay

## 2022-04-27 ENCOUNTER — Ambulatory Visit (HOSPITAL_COMMUNITY)
Admission: RE | Admit: 2022-04-27 | Discharge: 2022-04-27 | Disposition: A | Discharge status: Home / Self Care | Payer: Commercial Managed Care - HMO | Source: Ambulatory Visit | Attending: Internal Medicine | Admitting: Internal Medicine

## 2022-04-27 ENCOUNTER — Encounter (HOSPITAL_COMMUNITY): Payer: Self-pay | Admitting: Internal Medicine

## 2022-04-27 ENCOUNTER — Ambulatory Visit (HOSPITAL_BASED_OUTPATIENT_CLINIC_OR_DEPARTMENT_OTHER)
Admission: RE | Admit: 2022-04-27 | Discharge: 2022-04-27 | Disposition: A | Discharge status: Home / Self Care | Payer: Commercial Managed Care - HMO | Source: Ambulatory Visit | Attending: *Deleted | Admitting: *Deleted

## 2022-04-27 ENCOUNTER — Telehealth (HOSPITAL_COMMUNITY): Payer: Self-pay

## 2022-04-27 VITALS — BP 114/70 | HR 83 | Wt 154.8 lb

## 2022-04-27 DIAGNOSIS — E669 Obesity, unspecified: Secondary | ICD-10-CM | POA: Insufficient documentation

## 2022-04-27 DIAGNOSIS — F1491 Cocaine use, unspecified, in remission: Secondary | ICD-10-CM | POA: Diagnosis not present

## 2022-04-27 DIAGNOSIS — Z7984 Long term (current) use of oral hypoglycemic drugs: Secondary | ICD-10-CM | POA: Diagnosis not present

## 2022-04-27 DIAGNOSIS — F1410 Cocaine abuse, uncomplicated: Secondary | ICD-10-CM

## 2022-04-27 DIAGNOSIS — J90 Pleural effusion, not elsewhere classified: Secondary | ICD-10-CM | POA: Insufficient documentation

## 2022-04-27 DIAGNOSIS — I428 Other cardiomyopathies: Secondary | ICD-10-CM | POA: Insufficient documentation

## 2022-04-27 DIAGNOSIS — Z79899 Other long term (current) drug therapy: Secondary | ICD-10-CM | POA: Diagnosis not present

## 2022-04-27 DIAGNOSIS — Z6821 Body mass index (BMI) 21.0-21.9, adult: Secondary | ICD-10-CM | POA: Insufficient documentation

## 2022-04-27 DIAGNOSIS — I824Z1 Acute embolism and thrombosis of unspecified deep veins of right distal lower extremity: Secondary | ICD-10-CM | POA: Diagnosis not present

## 2022-04-27 DIAGNOSIS — Z720 Tobacco use: Secondary | ICD-10-CM | POA: Diagnosis not present

## 2022-04-27 DIAGNOSIS — Z7901 Long term (current) use of anticoagulants: Secondary | ICD-10-CM | POA: Insufficient documentation

## 2022-04-27 DIAGNOSIS — I5022 Chronic systolic (congestive) heart failure: Secondary | ICD-10-CM

## 2022-04-27 DIAGNOSIS — F17200 Nicotine dependence, unspecified, uncomplicated: Secondary | ICD-10-CM | POA: Insufficient documentation

## 2022-04-27 DIAGNOSIS — Z8673 Personal history of transient ischemic attack (TIA), and cerebral infarction without residual deficits: Secondary | ICD-10-CM | POA: Insufficient documentation

## 2022-04-27 DIAGNOSIS — Z7982 Long term (current) use of aspirin: Secondary | ICD-10-CM | POA: Diagnosis not present

## 2022-04-27 DIAGNOSIS — I5032 Chronic diastolic (congestive) heart failure: Secondary | ICD-10-CM

## 2022-04-27 DIAGNOSIS — H9190 Unspecified hearing loss, unspecified ear: Secondary | ICD-10-CM | POA: Diagnosis not present

## 2022-04-27 LAB — COMPREHENSIVE METABOLIC PANEL
ALT: 16 U/L (ref 0–44)
AST: 33 U/L (ref 15–41)
Albumin: 4.7 g/dL (ref 3.5–5.0)
Alkaline Phosphatase: 61 U/L (ref 38–126)
Anion gap: 10 (ref 5–15)
BUN: 20 mg/dL (ref 6–20)
CO2: 25 mmol/L (ref 22–32)
Calcium: 8.9 mg/dL (ref 8.9–10.3)
Chloride: 95 mmol/L — ABNORMAL LOW (ref 98–111)
Creatinine, Ser: 1.47 mg/dL — ABNORMAL HIGH (ref 0.61–1.24)
GFR, Estimated: >60 mL/min (ref >60)
Glucose, Bld: 100 mg/dL — ABNORMAL HIGH (ref 70–99)
Potassium: 3.2 mmol/L — ABNORMAL LOW (ref 3.5–5.1)
Sodium: 130 mmol/L — ABNORMAL LOW (ref 135–145)
Total Bilirubin: 1.3 mg/dL — ABNORMAL HIGH (ref 0.3–1.2)
Total Protein: 7.9 g/dL (ref 6.5–8.1)

## 2022-04-27 LAB — ECHOCARDIOGRAM COMPLETE
AR max vel: 2.3 cm2
AV Area VTI: 2.38 cm2
AV Area mean vel: 2.19 cm2
AV Mean grad: 3 mmHg
AV Peak grad: 6.8 mmHg
Ao pk vel: 1.3 m/s
Area-P 1/2: 3.93 cm2
Calc EF: 25.2 %
S' Lateral: 6.1 cm
Single Plane A2C EF: 23.3 %
Single Plane A4C EF: 26.8 %

## 2022-04-27 LAB — CBC
HCT: 39.9 % (ref 39.0–52.0)
Hemoglobin: 14 g/dL (ref 13.0–17.0)
MCH: 33.5 pg (ref 26.0–34.0)
MCHC: 35.1 g/dL (ref 30.0–36.0)
MCV: 95.5 fL (ref 80.0–100.0)
Platelets: 236 10*3/uL (ref 150–400)
RBC: 4.18 MIL/uL — ABNORMAL LOW (ref 4.22–5.81)
RDW: 14 % (ref 11.5–15.5)
WBC: 4.9 10*3/uL (ref 4.0–10.5)
nRBC: 0 % (ref 0.0–0.2)

## 2022-04-27 LAB — BRAIN NATRIURETIC PEPTIDE: B Natriuretic Peptide: 530.7 pg/mL — ABNORMAL HIGH (ref 0.0–100.0)

## 2022-04-27 MED ORDER — PERFLUTREN LIPID MICROSPHERE
1.0000 mL | INTRAVENOUS | Status: DC | PRN
  Administered 2022-04-27: 2 mL via INTRAVENOUS
  Filled 2022-04-27: qty 10

## 2022-04-27 NOTE — Progress Notes (Signed)
ADVANCED HF CLINIC NOTE  Primary Care: Pending HF Cardiologist: Dr. Gala Romney  Reason for Visit: Chronic Systolic Heart Failure   HPI: Carlos Mckinney is a 26 y.o.male with hx tobacco use, cocaine abuse, obesity.  He's had occasional ED visits over the years.  No routine care.   ED visit 11/2020 with cough, dyspnea, tachycardia/tachypnea.  Chest x-ray with bilateral mild to moderate perihilar and bibasilar infiltrates.  Some suspicion for COVID, pt prescribed CAP coverage and left AMA before covid testing performed.    He was incarcerated from May - September 2022. Released from prison 04/25/2021.  Hospitalized at [facility] and transferred to [facility] 08/22 for acute systolic HF and was seen by Advanced Heart Failure team. Diuresed with IV lasix and transitioned to po furosemide 20 mg daily.  Started on GDMT with enstresto and spiro. Echo 08/22 with EF 15-20%, mildly dilated RV with normal RV function, mild MR, moderate TR, PAP 40-45 mmHg. Coronary angiogram not performed. 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 Reports heavy cocaine use for a period of time before incarcerated. Denies any recent drug use, still drinking ETOH occasionally.   Established care 05/02/21, mild volume overload and NYHA II-III. Lasix increased.  Seen in ED 05/24/21 for CP and swelling, left without being seen.  Seen back in clinic 11/22 and was fluid overloaded after running out of meds. Diuretics restarted and close f/u was advised but he no-showed the last 2 appts.   Direct admit from clinic 2/23 for a/c CHF and cardiogenic shock. PICC line placed for CVP and co-ox monitoring. Initial co-ox 24%, started on inotrope support with milrinone. Diuresed > 30  lb with IV lasix. Weaned off milrinone once diuresed. UDS + for cocaine and marijuana. Started on GDMT. Refused R/LHC. cMRI - with severe LVEF 12% and RV 18%. Delayed enhancement images poor due to recent feraheme. Previously stopped Eliquis due to hematochezia (felt to be  2/2 hemorrhoids), restarted. He was adamant to go home despite CoOx 40%. Discussed risks and PICC removed. Discharged home, weight 196 lbs.  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 UDS+ cocaine and THC. Underwent mechanical thrombectomyHe continued with left-sided weakness and aphasia. CIR was recommended but patient refused. He was discharged home on Xarelto + ASA with wife, weight 206 lbs.  Presents today for routine f/u. Here w/ wife. He is clearly intoxicated/high. Intermittently answers questions with slurred speech then falls asleep . Wife answers for him.  Says he is feeling ok. Walking a bunch. Denies SOB, CP or edema.    Review of systems complete and found to be negative unless listed in HPI.    Current Outpatient Medications  Medication Sig Dispense Refill   albuterol (VENTOLIN HFA) 108 (90 Base) MCG/ACT inhaler Inhale 1-2 puffs into the lungs every 6 (six) hours as needed for wheezing or shortness of breath.     dapagliflozin propanediol (FARXIGA) 10 MG TABS tablet Take 1 tablet (10 mg total) by mouth daily before breakfast. 30 tablet 6   digoxin (LANOXIN) 0.125 MG tablet Take 1 tablet (0.125 mg total) by mouth daily. 30 tablet 6   furosemide (LASIX) 40 MG tablet Take 1 tablet (40 mg total) by mouth daily. 30 tablet 3   rivaroxaban (XARELTO) 20 MG TABS tablet Take 1 tablet (20 mg total) by mouth daily. 30 tablet 11   sacubitril-valsartan (ENTRESTO) 49-51 MG Take 1 tablet by mouth 2 (two) times daily. 180 tablet 3   spironolactone (  ALDACTONE) 25 MG tablet Take 1 tablet (25 mg total) by mouth daily. 30 tablet 6   No current facility-administered medications for this encounter.   No Known Allergies  Social History   Socioeconomic History   Marital status: Married    Spouse name: Not on file   Number of children: Not on file   Years of education: Not on file   Highest education level: Not on file   Occupational History   Not on file  Tobacco Use   Smoking status: Former    Types: Cigarettes   Smokeless tobacco: Never  Substance and Sexual Activity   Alcohol use: No   Drug use: No   Sexual activity: Not on file  Other Topics Concern   Not on file  Social History Narrative   Not on file   Social Determinants of Health   Financial Resource Strain: High Risk (10/03/2021)   Overall Financial Resource Strain (CARDIA)    Difficulty of Paying Living Expenses: Hard  Food Insecurity: No Food Insecurity (10/03/2021)   Hunger Vital Sign    Worried About Running Out of Food in the Last Year: Never true    Ran Out of Food in the Last Year: Never true  Transportation Needs: No Transportation Needs (10/03/2021)   PRAPARE - Administrator, Civil Service (Medical): No    Lack of Transportation (Non-Medical): No  Physical Activity: Not on file  Stress: Not on file  Social Connections: Not on file  Intimate Partner Violence: Not on file   FH: His maternal cousin has systolic HF and father has heart issues but details not known.   SH: Homeless. Staying with grandmother presently. Unable to work, applying for disability.  BP 114/70   Pulse 83   Wt 70.2 kg (154 lb 12.8 oz)   SpO2 99%   BMI 21.59 kg/m   Wt Readings from Last 3 Encounters:  04/27/22 70.2 kg (154 lb 12.8 oz)  01/20/22 82.2 kg (181 lb 3.2 oz)  10/03/21 90.2 kg (198 lb 12.8 oz)   PHYSICAL EXAM: General:  Appears inebriated/high. Will answer some questions with slurred speech then falls asleep. No resp difficulty HEENT: normal Neck: supple. no JVD. Carotids 2+ bilat; no bruits. No lymphadenopathy or thryomegaly appreciated. Cor: PMI nondisplaced. Regular rate & rhythm. No rubs, gallops or murmurs. Lungs: clear Abdomen: soft, nontender, nondistended. No hepatosplenomegaly. No bruits or masses. Good bowel sounds. Extremities: no cyanosis, clubbing, rash, edema Neuro: as above. Moves all 4. Appears inebriated.  No focal deficits  ASSESSMENT & PLAN: 1. 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 Admit for a/c HF 08/22.  - Echo (8/22): EF 15-20%, mildly dilated RV with normal RV function, mild MR, moderate TR, PAP 40-45 mmHg - Echo 08/07/21 EF < 20%.  - Declined R/LHC, states he may consider another time - cMRI 2/23 EF 12% RVEF 18% minimal LGE. Moderate left pleural effusion  - Echo  today 04/27/22: EF 15% RV normal. G1DD - NYHA Class I-II by report but hard to tell  - Continue Entresto 49/51 mg bid. - Continue Lasix 40 mg daily. - Continue digoxin 0.125 mg daily. Check labs and Dig level  - Continue spiro 25 mg daily. - Continue Jardiance 10 mg daily. - Not on b-blocker with cocaine use and low output  - Not a candidate for advanced therapies/ICD with ongoing  polysubstance abuse.  - Wife now helping with medication management. - Clearly with ongoing drug  use based on exam today  2. 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 - Continue ASA + Xarelto - + residual hearing loss   3. H/o Snoring: - Refused sleep study - no changes   4. Substance Use: - Cocaine/ETOH and tobacco use history  - UDS + cocaine and THC last admit 2/23 - Denies any further cocaine use, still smoking  - Imperative he refrains from further use  - seems high on exam today check UDS.   5. PE/ RLE DVT - Diagnosed 08/06/21, placed on Eliquis. - Continue Xarelto  - CBC today.    Glori Bickers, MD 04/27/22

## 2022-04-27 NOTE — Progress Notes (Signed)
Patient signed a DPR today.

## 2022-04-27 NOTE — Patient Instructions (Signed)
There has been no changes to your medications.  Labs done today, your results will be available in MyChart, we will contact you for abnormal readings.  Your physician recommends that you schedule a follow-up appointment in: 2 months  If you have any questions or concerns before your next appointment please send us a message through mychart or call our office at [phone].    TO LEAVE A MESSAGE FOR THE NURSE SELECT OPTION 2, PLEASE LEAVE A MESSAGE INCLUDING: YOUR NAME DATE OF BIRTH CALL BACK NUMBER REASON FOR CALL**this is important as we prioritize the call backs  YOU WILL RECEIVE A CALL BACK THE SAME DAY AS LONG AS YOU CALL BEFORE 4:00 PM  At the [facility], you and your health needs are our priority. As part of our continuing mission to provide you with exceptional heart care, we have created designated Provider Care Teams. These Care Teams include your primary Cardiologist (physician) and Advanced Practice Providers (APPs- Physician Assistants and Nurse Practitioners) who all work together to provide you with the care you need, when you need it.   You may see any of the following providers on your designated Care Team at your next follow up: Dr Daniel Bensimhon Dr Dalton McLean Dr. Aditya Sabharwal Amy Clegg, NP Brittainy Simmons, PA Jessica Milford,NP Lindsay Finch, PA Alma Diaz, NP Lauren Kemp, PharmD   Please be sure to bring in all your medications bottles to every appointment.    

## 2022-04-27 NOTE — Progress Notes (Addendum)
  Echocardiogram 2D Echocardiogram with contrast has been performed.  Carlos Mckinney F 04/27/2022, 4:14 PM

## 2022-04-27 NOTE — Telephone Encounter (Signed)
Patient signed a DPR  Paperwork has been stamped and placed in folder to be scanned.

## 2022-04-30 DIAGNOSIS — Z419 Encounter for procedure for purposes other than remedying health state, unspecified: Secondary | ICD-10-CM | POA: Diagnosis not present

## 2022-05-03 ENCOUNTER — Other Ambulatory Visit (HOSPITAL_COMMUNITY): Payer: Self-pay

## 2022-05-03 ENCOUNTER — Telehealth (HOSPITAL_COMMUNITY): Payer: Self-pay

## 2022-05-03 DIAGNOSIS — I5032 Chronic diastolic (congestive) heart failure: Secondary | ICD-10-CM

## 2022-05-03 MED ORDER — POTASSIUM CHLORIDE CRYS ER 20 MEQ PO TBCR
20.0000 meq | EXTENDED_RELEASE_TABLET | Freq: Every day | ORAL | 5 refills | Status: DC
  Filled 2022-05-03: qty 60, 60d supply, fill #0

## 2022-05-03 NOTE — Telephone Encounter (Addendum)
Pt aware, agreeable, and verbalized understanding Prescription sent to pharmacy   ----- Message from Jolaine Artist, MD sent at 05/01/2022 12:23 PM EDT ----- Add potassium 20 daily. Recheck BMET 2 weeks

## 2022-05-08 ENCOUNTER — Other Ambulatory Visit (HOSPITAL_COMMUNITY): Payer: Self-pay

## 2022-05-11 ENCOUNTER — Other Ambulatory Visit (HOSPITAL_COMMUNITY): Payer: Self-pay

## 2022-05-19 ENCOUNTER — Other Ambulatory Visit (HOSPITAL_COMMUNITY): Payer: Commercial Managed Care - HMO

## 2022-05-19 ENCOUNTER — Other Ambulatory Visit (HOSPITAL_COMMUNITY): Payer: Self-pay

## 2022-05-31 DIAGNOSIS — Z419 Encounter for procedure for purposes other than remedying health state, unspecified: Secondary | ICD-10-CM | POA: Diagnosis not present

## 2022-06-02 ENCOUNTER — Ambulatory Visit (HOSPITAL_COMMUNITY)
Admission: RE | Admit: 2022-06-02 | Discharge: 2022-06-02 | Disposition: A | Discharge status: Home / Self Care | Payer: Commercial Managed Care - HMO | Source: Ambulatory Visit | Attending: Cardiology | Admitting: Cardiology

## 2022-06-02 DIAGNOSIS — I5032 Chronic diastolic (congestive) heart failure: Secondary | ICD-10-CM | POA: Insufficient documentation

## 2022-06-02 LAB — BASIC METABOLIC PANEL
Anion gap: 5 (ref 5–15)
BUN: 9 mg/dL (ref 6–20)
CO2: 27 mmol/L (ref 22–32)
Calcium: 9.6 mg/dL (ref 8.9–10.3)
Chloride: 106 mmol/L (ref 98–111)
Creatinine, Ser: 1.15 mg/dL (ref 0.61–1.24)
GFR, Estimated: >60 mL/min (ref >60)
Glucose, Bld: 88 mg/dL (ref 70–99)
Potassium: 4.3 mmol/L (ref 3.5–5.1)
Sodium: 138 mmol/L (ref 135–145)

## 2022-06-06 ENCOUNTER — Other Ambulatory Visit (HOSPITAL_COMMUNITY): Payer: Self-pay

## 2022-06-06 ENCOUNTER — Encounter (HOSPITAL_COMMUNITY): Payer: Self-pay | Admitting: Internal Medicine

## 2022-06-06 ENCOUNTER — Ambulatory Visit (HOSPITAL_COMMUNITY)
Admission: RE | Admit: 2022-06-06 | Discharge: 2022-06-06 | Disposition: A | Discharge status: Home / Self Care | Payer: Commercial Managed Care - HMO | Source: Ambulatory Visit | Attending: Internal Medicine | Admitting: Internal Medicine

## 2022-06-06 VITALS — BP 110/70 | HR 77 | Wt 175.4 lb

## 2022-06-06 DIAGNOSIS — I639 Cerebral infarction, unspecified: Secondary | ICD-10-CM | POA: Diagnosis not present

## 2022-06-06 DIAGNOSIS — I428 Other cardiomyopathies: Secondary | ICD-10-CM | POA: Diagnosis not present

## 2022-06-06 DIAGNOSIS — R0683 Snoring: Secondary | ICD-10-CM | POA: Insufficient documentation

## 2022-06-06 DIAGNOSIS — F1410 Cocaine abuse, uncomplicated: Secondary | ICD-10-CM | POA: Diagnosis not present

## 2022-06-06 DIAGNOSIS — Z8673 Personal history of transient ischemic attack (TIA), and cerebral infarction without residual deficits: Secondary | ICD-10-CM | POA: Insufficient documentation

## 2022-06-06 DIAGNOSIS — Z7901 Long term (current) use of anticoagulants: Secondary | ICD-10-CM | POA: Insufficient documentation

## 2022-06-06 DIAGNOSIS — I82509 Chronic embolism and thrombosis of unspecified deep veins of unspecified lower extremity: Secondary | ICD-10-CM | POA: Insufficient documentation

## 2022-06-06 DIAGNOSIS — I5020 Unspecified systolic (congestive) heart failure: Secondary | ICD-10-CM

## 2022-06-06 DIAGNOSIS — E669 Obesity, unspecified: Secondary | ICD-10-CM | POA: Insufficient documentation

## 2022-06-06 DIAGNOSIS — I5022 Chronic systolic (congestive) heart failure: Secondary | ICD-10-CM | POA: Insufficient documentation

## 2022-06-06 DIAGNOSIS — Z79899 Other long term (current) drug therapy: Secondary | ICD-10-CM | POA: Diagnosis not present

## 2022-06-06 DIAGNOSIS — Z720 Tobacco use: Secondary | ICD-10-CM

## 2022-06-06 LAB — COMPREHENSIVE METABOLIC PANEL
ALT: 12 U/L (ref 0–44)
AST: 19 U/L (ref 15–41)
Albumin: 4 g/dL (ref 3.5–5.0)
Alkaline Phosphatase: 54 U/L (ref 38–126)
Anion gap: 6 (ref 5–15)
BUN: 8 mg/dL (ref 6–20)
CO2: 26 mmol/L (ref 22–32)
Calcium: 9.9 mg/dL (ref 8.9–10.3)
Chloride: 108 mmol/L (ref 98–111)
Creatinine, Ser: 1.05 mg/dL (ref 0.61–1.24)
GFR, Estimated: >60 mL/min (ref >60)
Glucose, Bld: 85 mg/dL (ref 70–99)
Potassium: 4.3 mmol/L (ref 3.5–5.1)
Sodium: 140 mmol/L (ref 135–145)
Total Bilirubin: 0.7 mg/dL (ref 0.3–1.2)
Total Protein: 6.9 g/dL (ref 6.5–8.1)

## 2022-06-06 LAB — CBC
HCT: 42.4 % (ref 39.0–52.0)
Hemoglobin: 13.7 g/dL (ref 13.0–17.0)
MCH: 32.9 pg (ref 26.0–34.0)
MCHC: 32.3 g/dL (ref 30.0–36.0)
MCV: 101.9 fL — ABNORMAL HIGH (ref 80.0–100.0)
Platelets: 222 10*3/uL (ref 150–400)
RBC: 4.16 MIL/uL — ABNORMAL LOW (ref 4.22–5.81)
RDW: 12.4 % (ref 11.5–15.5)
WBC: 4.5 10*3/uL (ref 4.0–10.5)
nRBC: 0 % (ref 0.0–0.2)

## 2022-06-06 LAB — ETHANOL: Alcohol, Ethyl (B): <10 mg/dL (ref <10)

## 2022-06-06 LAB — BRAIN NATRIURETIC PEPTIDE: B Natriuretic Peptide: 573.4 pg/mL — ABNORMAL HIGH (ref 0.0–100.0)

## 2022-06-06 MED ORDER — DAPAGLIFLOZIN PROPANEDIOL 10 MG PO TABS
10.0000 mg | ORAL_TABLET | Freq: Every day | ORAL | 6 refills | Status: DC
  Filled 2022-06-06: qty 30, 30d supply, fill #0
  Filled 2022-06-19 – 2022-06-30 (×2): qty 30, 30d supply, fill #1
  Filled 2022-07-28: qty 30, 30d supply, fill #2
  Filled 2022-09-05: qty 30, 30d supply, fill #3
  Filled 2022-10-02: qty 30, 30d supply, fill #4
  Filled 2022-11-09: qty 30, 30d supply, fill #5
  Filled 2022-12-06: qty 30, 30d supply, fill #6

## 2022-06-06 NOTE — Patient Instructions (Signed)
Medication Changes:  RESTART Farixga, refill has been sent to pharmacy  Lab Work:  Labs done today, your results will be available in MyChart, we will contact you for abnormal readings.  Testing/Procedures:  none  Referrals:  You have been referred to Neurology, they will call you for an appointment  Special Instructions // Education:  Do the following things EVERYDAY: Weigh yourself in the morning before breakfast. Write it down and keep it in a log. Take your medicines as prescribed Eat low salt foods--Limit salt (sodium) to 2000 mg per day.  Stay as active as you can everyday Limit all fluids for the day to less than 2 liters   Follow-Up in: 3 months  At the [facility], you and your health needs are our priority. We have a designated team specialized in the treatment of Heart Failure. This Care Team includes your primary Heart Failure Specialized Cardiologist (physician), Advanced Practice Providers (APPs- Physician Assistants and Nurse Practitioners), and Pharmacist who all work together to provide you with the care you need, when you need it.   You may see any of the following providers on your designated Care Team at your next follow up:  Dr. Glori Bickers Dr. Loralie Champagne Dr. Roxana Hires, NP Lyda Jester, [place] [facility] [place] [facility] [place], [place] Forestine Na, NP Audry Riles, PharmD   Please be sure to bring in all your medications bottles to every appointment.   Need to Contact us:  If you have any questions or concerns before your next appointment please send [place] a message through [place] or call our office at [phone].    TO LEAVE A MESSAGE FOR THE NURSE SELECT OPTION 2, PLEASE LEAVE A MESSAGE INCLUDING: YOUR NAME DATE OF BIRTH CALL BACK NUMBER REASON FOR CALL**this is important as we prioritize the call backs  YOU WILL RECEIVE A CALL BACK THE SAME DAY AS LONG AS YOU CALL BEFORE 4:00 PM

## 2022-06-06 NOTE — Progress Notes (Signed)
ADVANCED HF CLINIC NOTE  Primary Care: Pending HF Cardiologist: Dr. Haroldine Laws  Reason for Visit: Chronic Systolic Heart Failure   HPI: Carlos Mckinney is a 26 y.o.male with hx tobacco use, cocaine abuse, obesity.  He's had occasional ED visits over the years.  No routine care.   ED visit 11/2020 with cough, dyspnea, tachycardia/tachypnea.  Chest x-ray with bilateral mild to moderate perihilar and bibasilar infiltrates.  Some suspicion for COVID, pt prescribed CAP coverage and left AMA before covid testing performed.    He was incarcerated from May - September 2022. Released from prison 04/25/2021.  Hospitalized at [facility] and transferred to [id number] for acute systolic HF and was seen by Advanced Heart Failure team. Diuresed with IV lasix and transitioned to po furosemide 20 mg daily.  Started on GDMT with enstresto and spiro. Echo 08/22 with EF 15-20%, mildly dilated RV with normal RV function, mild MR, moderate TR, PAP 40-45 mmHg. Coronary angiogram not performed. 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 Reports heavy cocaine use for a period of time before incarcerated. Denies any recent drug use, still drinking ETOH occasionally.   Established care 05/02/21, mild volume overload and NYHA II-III. Lasix increased.  Seen in ED 05/24/21 for CP and swelling, left without being seen.  Seen back in clinic 11/22 and was fluid overloaded after running out of meds. Diuretics restarted and close f/u was advised but he no-showed the last 2 appts.   Direct admit from clinic 2/23 for a/c CHF and cardiogenic shock. PICC line placed for CVP and co-ox monitoring. Initial co-ox 24%, started on inotrope support with milrinone. Diuresed > 30  lb with IV lasix. Weaned off milrinone once diuresed. UDS + for cocaine and marijuana. Started on GDMT. Refused R/LHC. cMRI - with severe LVEF 12% and RV 18%. Delayed enhancement images poor due to recent feraheme. Previously stopped Eliquis due to hematochezia (felt to be  2/2 hemorrhoids), restarted. He was adamant to go home despite CoOx 40%. Discussed risks and PICC removed. Discharged home, weight 196 lbs.  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 UDS+ cocaine and THC. Underwent mechanical thrombectomyHe continued with left-sided weakness and aphasia. CIR was recommended but patient refused. He was discharged home on Xarelto + ASA with wife, weight 206 lbs.  Presents today for routine f/u. At last visit he was here with his girlfriend. He was clearly intoxicated/high. Refused drug testing.   Here today with his mother who said she needs to help take over his care. She's says he has problems with his memory. Continues to smoke. Eating a lot. Not answering questions verbally. Having trouble following commands. Falls asleep quickly. Nearly walked into a wall. She doesn't think he is using drugs or ETOH. (Aside from [facility]).    Review of systems complete and found to be negative unless listed in HPI.    Current Outpatient Medications  Medication Sig Dispense Refill   digoxin (LANOXIN) 0.125 MG tablet Take 1 tablet (0.125 mg total) by mouth daily. 30 tablet 6   furosemide (LASIX) 40 MG tablet Take 1 tablet (40 mg total) by mouth daily. 30 tablet 3   potassium chloride SA (KLOR-CON M) 20 MEQ tablet Take 1 tablet (20 mEq total) by mouth daily. 60 tablet 5   rivaroxaban (XARELTO) 20 MG TABS tablet Take 1 tablet (20 mg total) by mouth daily. 30 tablet 11   sacubitril-valsartan (ENTRESTO) 49-51 MG Take 1 tablet by mouth 2 (  two) times daily. 180 tablet 3   spironolactone (ALDACTONE) 25 MG tablet Take 1 tablet (25 mg total) by mouth daily. 30 tablet 6   albuterol (VENTOLIN HFA) 108 (90 Base) MCG/ACT inhaler Inhale 1-2 puffs into the lungs every 6 (six) hours as needed for wheezing or shortness of breath.     No current facility-administered medications for this encounter.   No Known Allergies  Social  History   Socioeconomic History   Marital status: Married    Spouse name: Not on file   Number of children: Not on file   Years of education: Not on file   Highest education level: Not on file  Occupational History   Not on file  Tobacco Use   Smoking status: Former    Types: Cigarettes   Smokeless tobacco: Never  Substance and Sexual Activity   Alcohol use: No   Drug use: No   Sexual activity: Not on file  Other Topics Concern   Not on file  Social History Narrative   Not on file   Social Determinants of Health   Financial Resource Strain: High Risk (10/03/2021)   Overall Financial Resource Strain (CARDIA)    Difficulty of Paying Living Expenses: Hard  Food Insecurity: No Food Insecurity (10/03/2021)   Hunger Vital Sign    Worried About Running Out of Food in the Last Year: Never true    Ran Out of Food in the Last Year: Never true  Transportation Needs: No Transportation Needs (10/03/2021)   PRAPARE - Hydrologist (Medical): No    Lack of Transportation (Non-Medical): No  Physical Activity: Not on file  Stress: Not on file  Social Connections: Not on file  Intimate Partner Violence: Not on file   FH: His maternal cousin has systolic HF and father has heart issues but details not known.   SH: Homeless. Staying with grandmother presently. Unable to work, applying for disability.  BP 110/70   Pulse 77   Wt 79.6 kg (175 lb 6.4 oz)   SpO2 99%   BMI 24.46 kg/m   Wt Readings from Last 3 Encounters:  06/06/22 79.6 kg (175 lb 6.4 oz)  04/27/22 70.2 kg (154 lb 12.8 oz)  01/20/22 82.2 kg (181 lb 3.2 oz)   PHYSICAL EXAM: General:  Falls asleep frequently. Having trouble following commands.  HEENT: normal Neck: supple. no JVD. Carotids 2+ bilat; no bruits. No lymphadenopathy or thryomegaly appreciated. Cor: PMI nondisplaced. Regular rate & rhythm. No rubs, gallops or murmurs. Lungs: clear Abdomen: soft, nontender, nondistended. No  hepatosplenomegaly. No bruits or masses. Good bowel sounds. Extremities: no cyanosis, clubbing, rash, edema Neuro: as above. Wide-based gait. Struggles with following commands. No focal deficits    ASSESSMENT & PLAN:  1. 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 Admit for a/c HF 08/22.  - Echo (8/22): EF 15-20%, mildly dilated RV with normal RV function, mild MR, moderate TR, PAP 40-45 mmHg - Echo 08/07/21 EF < 20%.  - Declined R/LHC, states he may consider another time - cMRI 2/23 EF 12% RVEF 18% minimal LGE. Moderate left pleural effusion  - Echo  04/27/22: EF 15% RV normal. G1DD - NYHA Class II by report but ahrd to assess given mental status  - Continue Entresto 49/51 mg bid. - Continue Lasix 40 mg daily. - Continue digoxin 0.125 mg daily. - Continue spiro 25 mg daily. - Restart Farxiga - Not on b-blocker with cocaine use  and low output  - Not a candidate for advanced therapies/ICD with mental status and possible ongoing polysubstance use  2. 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 - Continue ASA + Xarelto - Unclear if deficits related to CVA or drug use - Check ETOH/drug screen.  - Refer to Neuro   3. H/o Snoring: - Refused sleep study - no changes   4. Substance Use: - Cocaine/ETOH and tobacco use history  - UDS + cocaine and THC last admit 2/23 - Denies any further cocaine use, still smoking  - Imperative he refrains from further use  - seems intoxicated on exam today -> plan as above  5. PE/ RLE DVT - Diagnosed 08/06/21, placed on Eliquis. - Continue Xarelto  - CBC today    Glori Bickers, MD 06/06/22

## 2022-06-07 ENCOUNTER — Encounter: Payer: Self-pay | Admitting: Neurology

## 2022-06-19 ENCOUNTER — Other Ambulatory Visit (HOSPITAL_COMMUNITY): Payer: Self-pay

## 2022-06-20 ENCOUNTER — Other Ambulatory Visit (HOSPITAL_COMMUNITY): Payer: Self-pay

## 2022-06-30 ENCOUNTER — Other Ambulatory Visit (HOSPITAL_COMMUNITY): Payer: Self-pay

## 2022-06-30 DIAGNOSIS — Z419 Encounter for procedure for purposes other than remedying health state, unspecified: Secondary | ICD-10-CM | POA: Diagnosis not present

## 2022-07-17 ENCOUNTER — Other Ambulatory Visit (HOSPITAL_COMMUNITY): Payer: Self-pay

## 2022-07-17 ENCOUNTER — Other Ambulatory Visit (HOSPITAL_COMMUNITY): Payer: Self-pay | Admitting: Adult Health

## 2022-07-17 MED ORDER — DIGOXIN 125 MCG PO TABS
0.1250 mg | ORAL_TABLET | Freq: Every day | ORAL | 6 refills | Status: DC
  Filled 2022-07-17: qty 30, 30d supply, fill #0
  Filled 2022-08-18 – 2022-08-22 (×3): qty 30, 30d supply, fill #1
  Filled 2022-09-18: qty 30, 30d supply, fill #2
  Filled 2022-09-19: qty 30, 30d supply, fill #0
  Filled 2022-10-17: qty 30, 30d supply, fill #1
  Filled 2022-11-15: qty 30, 30d supply, fill #2
  Filled 2022-12-11 – 2022-12-13 (×2): qty 30, 30d supply, fill #3
  Filled 2023-01-25: qty 30, 30d supply, fill #4

## 2022-07-19 ENCOUNTER — Other Ambulatory Visit (HOSPITAL_COMMUNITY): Payer: Self-pay

## 2022-07-20 ENCOUNTER — Telehealth (HOSPITAL_COMMUNITY): Payer: Self-pay | Admitting: Internal Medicine

## 2022-07-20 ENCOUNTER — Other Ambulatory Visit (HOSPITAL_COMMUNITY): Payer: Self-pay

## 2022-07-20 MED ORDER — ENTRESTO 49-51 MG PO TABS
1.0000 | ORAL_TABLET | Freq: Two times a day (BID) | ORAL | 3 refills | Status: DC
  Filled 2022-07-20 – 2022-11-15 (×2): qty 180, 90d supply, fill #0
  Filled 2023-02-26 – 2023-06-11 (×2): qty 180, 90d supply, fill #1

## 2022-07-20 NOTE — Telephone Encounter (Signed)
Pt is out of Entresto please call pt [phone]

## 2022-07-20 NOTE — Telephone Encounter (Signed)
Patient has insurance and a copay card. This has been sent to his wife previously, unless he has lost insurance, his Sherryll Burger should be $10 at his local pharmacy.

## 2022-07-20 NOTE — Telephone Encounter (Signed)
CO PAY CARD USAGE ADVISED BY ELIZABETH HINES -SAMPLES PROVIDED UNTIL PA IS COMPLETE  Medication Samples have been provided to the patient.  Drug name: ENTRESTO       Strength: 49/51        Qty: 56  LOT: [id number]  Exp.Date: 02/2024  Dosing instructions: ONE TAB TWICE A DAY  The patient has been instructed regarding the correct time, dose, and frequency of taking this medication, including desired effects and most common side effects.   Carlos Mckinney M 2:59 PM 07/20/2022

## 2022-07-21 ENCOUNTER — Telehealth (HOSPITAL_COMMUNITY): Payer: Self-pay | Admitting: Pharmacy Technician

## 2022-07-21 ENCOUNTER — Other Ambulatory Visit (HOSPITAL_COMMUNITY): Payer: Self-pay

## 2022-07-21 ENCOUNTER — Other Ambulatory Visit (HOSPITAL_COMMUNITY): Payer: Self-pay | Admitting: *Deleted

## 2022-07-21 MED ORDER — FUROSEMIDE 40 MG PO TABS
40.0000 mg | ORAL_TABLET | Freq: Every day | ORAL | 3 refills | Status: DC
  Filled 2022-07-21: qty 90, 90d supply, fill #0
  Filled 2022-10-17: qty 90, 90d supply, fill #1
  Filled 2023-01-31: qty 90, 90d supply, fill #2

## 2022-07-21 MED ORDER — SPIRONOLACTONE 25 MG PO TABS
25.0000 mg | ORAL_TABLET | Freq: Every day | ORAL | 3 refills | Status: DC
  Filled 2022-07-21: qty 90, 90d supply, fill #0
  Filled 2022-10-17: qty 90, 90d supply, fill #1
  Filled 2023-01-29: qty 90, 90d supply, fill #2
  Filled 2023-05-24: qty 90, 90d supply, fill #3

## 2022-07-21 NOTE — Telephone Encounter (Signed)
Patient Advocate Encounter   Received notification from [facility] that prior authorization for Sherryll Burger is required.   PA submitted on CoverMyMeds Key [id number] Status is pending   Will continue to follow.

## 2022-07-21 NOTE — Telephone Encounter (Signed)
Advanced Heart Failure Patient Advocate Encounter  Prior Authorization for Sherryll Burger has been approved.    PA# [id number] Effective dates: 07/21/22 through 07/21/23  Archer Asa, CPhT

## 2022-07-21 NOTE — Telephone Encounter (Signed)
Patient Advocate Encounter   Received notification from [facility] that prior authorization for [name] is required.   PA submitted on CoverMyMeds Key [id number] Status is pending   Will continue to follow.

## 2022-07-25 NOTE — Telephone Encounter (Signed)
Advanced Heart Failure Patient Advocate Encounter  Prior authorization for [name] has been approved. Effective 07/21/22 to 07/20/22 Determination letter has been added to patient chart.  [name], CPhT Rx Patient Advocate Phone: [phone]

## 2022-07-26 ENCOUNTER — Other Ambulatory Visit (HOSPITAL_COMMUNITY): Payer: Self-pay

## 2022-07-28 ENCOUNTER — Other Ambulatory Visit (HOSPITAL_COMMUNITY): Payer: Self-pay

## 2022-08-04 ENCOUNTER — Other Ambulatory Visit (HOSPITAL_COMMUNITY): Payer: Self-pay

## 2022-08-18 ENCOUNTER — Other Ambulatory Visit: Payer: Self-pay

## 2022-08-22 ENCOUNTER — Encounter (HOSPITAL_COMMUNITY): Payer: Self-pay

## 2022-08-22 ENCOUNTER — Other Ambulatory Visit (HOSPITAL_COMMUNITY): Payer: Self-pay

## 2022-08-23 ENCOUNTER — Other Ambulatory Visit (HOSPITAL_COMMUNITY): Payer: Self-pay

## 2022-08-31 ENCOUNTER — Other Ambulatory Visit (HOSPITAL_COMMUNITY): Payer: Self-pay

## 2022-09-05 ENCOUNTER — Other Ambulatory Visit (HOSPITAL_COMMUNITY): Payer: Self-pay

## 2022-09-06 ENCOUNTER — Encounter (HOSPITAL_COMMUNITY): Payer: Commercial Managed Care - HMO

## 2022-09-06 ENCOUNTER — Other Ambulatory Visit (HOSPITAL_COMMUNITY): Payer: Self-pay

## 2022-09-13 ENCOUNTER — Other Ambulatory Visit (HOSPITAL_COMMUNITY): Payer: Self-pay

## 2022-09-14 ENCOUNTER — Other Ambulatory Visit (HOSPITAL_COMMUNITY): Payer: Self-pay

## 2022-09-19 ENCOUNTER — Other Ambulatory Visit: Payer: Self-pay

## 2022-09-19 ENCOUNTER — Other Ambulatory Visit (HOSPITAL_COMMUNITY): Payer: Self-pay

## 2022-09-20 ENCOUNTER — Other Ambulatory Visit (HOSPITAL_COMMUNITY): Payer: Self-pay

## 2022-09-20 NOTE — Progress Notes (Signed)
ADVANCED HF CLINIC NOTE  Primary Care: Carlos Foy, NP HF Cardiologist: Dr. Haroldine Mckinney  Reason for Visit: Chronic Systolic Heart Failure   HPI: Carlos Mckinney is a 27 y.o.male with hx tobacco use, cocaine abuse, obesity.  He's had occasional ED visits over the years.  No routine care.   ED visit 11/2020 with cough, dyspnea, tachycardia/tachypnea.  Chest x-ray with bilateral mild to moderate perihilar and bibasilar infiltrates.  Some suspicion for COVID, pt prescribed CAP coverage and left AMA before covid testing performed.    He was incarcerated from May - September 2022. Released from prison 04/25/2021.  Hospitalized at [facility] and transferred to [id number] for acute systolic HF and was seen by AHF team. Echo 08/22 with EF 15-20%, mildly dilated RV with normal RV function, mild MR, moderate TR, PAP 40-45 mmHg. Coronary angiogram not performed. 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 Reports heavy cocaine use for a period of time before incarcerated.  Diuresed with IV lasix and transitioned to oral Lasix 20 mg daily.  Started on GDMT with enstresto and spiro. Denies any recent drug use, still drinking ETOH occasionally.   Established care 05/02/21, mild volume overload and NYHA II-III. Lasix increased.  Seen in ED 05/24/21 for CP and swelling, left without being seen.  Seen back in clinic 11/22 and was fluid overloaded after running out of meds. Diuretics restarted and close f/u was advised but he no-showed the last 2 appts.   Direct admit from clinic 2/23 for a/c CHF and cardiogenic shock. PICC line placed for CVP and co-ox monitoring. Initial co-ox 24%, started on inotrope support with milrinone. Diuresed > 30  lb with IV lasix. Weaned off milrinone once diuresed. UDS + for cocaine and marijuana. Started on GDMT. Refused R/LHC. cMRI - with severe LVEF 12% and RV 18%. Delayed enhancement images poor due to recent feraheme. Previously stopped Eliquis due to hematochezia (felt to be 2/2  hemorrhoids), restarted. He was adamant to go home despite CoOx 40%. Discussed risks and PICC removed. Discharged home, weight 196 lbs.  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 UDS+ cocaine and THC. Underwent mechanical thrombectomyHe continued with left-sided weakness and aphasia. CIR was recommended but patient refused. He was discharged home on Xarelto + ASA with wife, weight 206 lbs.  At last visit he was here with his girlfriend. He was clearly intoxicated/high. Refused drug testing.   Follow up 11/23, NYHA II, Farxiga restarted. Mental status made it difficult to assess, ? Deficits from stroke vs drug use. Referred to Neuro.  Today he returns for HF follow up with his mother. She helps with most of the history as [name] has difficulty answering questions. Overall feeling fine. He has SOB with physical exertion, does OK walking and with ADLs. He answers "yes" to me asking if he has CP, dizziness and swelling, but his mother says he has not complained of any of this to her. He denies PND/Orthopnea. His main complaint is penile irritation and he is asking for a cream. Appetite ok. No fever or chills. He is not weighing at home, mom helps with his medications. Smokes 2 cigars/day, occasional ETOH, no drugs.   Review of systems complete and found to be negative unless listed in HPI.    Current Outpatient Medications  Medication Sig Dispense Refill   albuterol (VENTOLIN HFA) 108 (90 Base) MCG/ACT inhaler Inhale 1-2 puffs into the lungs every 6 (six) hours as needed for  wheezing or shortness of breath.     dapagliflozin propanediol (FARXIGA) 10 MG TABS tablet Take 1 tablet (10 mg total) by mouth daily before breakfast. 30 tablet 6   digoxin (LANOXIN) 0.125 MG tablet Take 1 tablet (0.125 mg total) by mouth daily. 30 tablet 6   furosemide (LASIX) 40 MG tablet Take 1 tablet (40 mg total) by mouth daily. 90 tablet 3   potassium  chloride SA (KLOR-CON M) 20 MEQ tablet Take 1 tablet (20 mEq total) by mouth daily. 60 tablet 5   rivaroxaban (XARELTO) 20 MG TABS tablet Take 1 tablet (20 mg total) by mouth daily. 30 tablet 11   sacubitril-valsartan (ENTRESTO) 49-51 MG Take 1 tablet by mouth 2 (two) times daily. 180 tablet 3   spironolactone (ALDACTONE) 25 MG tablet Take 1 tablet (25 mg total) by mouth daily. 90 tablet 3   No current facility-administered medications for this encounter.   No Known Allergies  Social History   Socioeconomic History   Marital status: Married    Spouse name: Not on file   Number of children: Not on file   Years of education: Not on file   Highest education level: Not on file  Occupational History   Not on file  Tobacco Use   Smoking status: Former    Types: Cigarettes   Smokeless tobacco: Never  Substance and Sexual Activity   Alcohol use: No   Drug use: No   Sexual activity: Not on file  Other Topics Concern   Not on file  Social History Narrative   Not on file   Social Determinants of Health   Financial Resource Strain: High Risk (10/03/2021)   Overall Financial Resource Strain (CARDIA)    Difficulty of Paying Living Expenses: Hard  Food Insecurity: No Food Insecurity (10/03/2021)   Hunger Vital Sign    Worried About Running Out of Food in the Last Year: Never true    Ran Out of Food in the Last Year: Never true  Transportation Needs: No Transportation Needs (10/03/2021)   PRAPARE - Hydrologist (Medical): No    Lack of Transportation (Non-Medical): No  Physical Activity: Not on file  Stress: Not on file  Social Connections: Not on file  Intimate Partner Violence: Not on file   FH: His maternal cousin has systolic HF and father has heart issues but details not known.   SH: Homeless. Staying with grandmother presently. Unable to work, applying for disability.  BP 118/76   Pulse (!) 54   Wt 81 kg (178 lb 9.6 oz)   SpO2 100%   BMI 24.91  kg/m   Wt Readings from Last 3 Encounters:  09/22/22 81 kg (178 lb 9.6 oz)  06/06/22 79.6 kg (175 lb 6.4 oz)  04/27/22 70.2 kg (154 lb 12.8 oz)   PHYSICAL EXAM: General:  NAD. No resp difficulty, walked into clinic,  HEENT: Normal Neck: Supple. No JVD. Carotids 2+ bilat; no bruits. No lymphadenopathy or thryomegaly appreciated. Cor: PMI nondisplaced. Regular rate & rhythm. No rubs, gallops or murmurs. Lungs: Clear, diminished in bases. Abdomen: Soft, nontender, nondistended. No hepatosplenomegaly. No bruits or masses. Good bowel sounds. Extremities: No cyanosis, clubbing, rash, edema Neuro: Alert & oriented x 3, cranial nerves grossly intact. Moves all 4 extremities w/o difficulty. Affect pleasant. Difficulty following commands, appears to have receptive/expressive aphasia.   ASSESSMENT & PLAN: 1. 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 Admit for a/c HF  08/22.  - Echo (8/22): EF 15-20%, mildly dilated RV with normal RV function, mild MR, moderate TR, PAP 40-45 mmHg - Echo 08/07/21 EF < 20%.  - Declined R/LHC, states he may consider another time - cMRI 2/23 EF 12% RVEF 18% minimal LGE. Moderate left pleural effusion  - Echo  04/27/22: EF 15% RV normal. G1DD - NYHA Class II but difficult to assess with aphasia. Volume OK today. - Continue Entresto 49/51 mg bid. - Continue Lasix 40 mg daily. Start taking 20 KCL daily. - Continue digoxin 0.125 mg daily. - Continue spiro 25 mg daily. - Continue Farxiga 10 mg daily. ? Penile irritation, genitalia looked OK on exam. - Not on b-blocker with cocaine use and low output  - Not a candidate for advanced therapies/ICD with mental status and possible ongoing polysubstance use - BMET and dig level today.  2. 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 - Continue Xarelto, no longer on ASA - Unclear if deficits related to CVA or drug use - He has neuro follow up 09/2022  3. H/o Snoring - Refused sleep study -  No changes   4. Substance Use - Cocaine/ETOH and tobacco use history  - UDS + cocaine and THC last admit 2/23 - Denies any further cocaine use, still smoking  - Imperative he refrains from further use   5. PE/ RLE DVT - Diagnosed 08/06/21, placed on Eliquis. - Continue Xarelto  - CBC today  6. Penile irritation - Difficult to illicit more history from him. Denies penile discharge. No CVA tenderness, fever or dysuria - External genitalia unremarkable on exam. - He is sexually active, uses condoms "sometimes" - I am unable to obtain u/a today, advised PCP follow up soon to work up. - In mean time, can try OTC lotrimin if irritation is related to yeast. Discussed potential side effects related to [place].  Follow up in 3-4 months with Dr. Haroldine Mckinney.   [place], FNP-BC 09/22/22

## 2022-09-21 ENCOUNTER — Telehealth (HOSPITAL_COMMUNITY): Payer: Self-pay

## 2022-09-21 ENCOUNTER — Other Ambulatory Visit (HOSPITAL_COMMUNITY): Payer: Self-pay

## 2022-09-21 NOTE — Telephone Encounter (Signed)
Attempted to reach patient to confirm appointment for 09/22/22. No answer and no voice mail

## 2022-09-22 ENCOUNTER — Ambulatory Visit (HOSPITAL_COMMUNITY)
Admission: RE | Admit: 2022-09-22 | Discharge: 2022-09-22 | Disposition: A | Discharge status: Home / Self Care | Payer: BLUE CROSS/BLUE SHIELD | Source: Ambulatory Visit | Attending: Family Medicine | Admitting: Family Medicine

## 2022-09-22 ENCOUNTER — Encounter (HOSPITAL_COMMUNITY): Payer: Self-pay

## 2022-09-22 VITALS — BP 118/76 | HR 54 | Wt 178.6 lb

## 2022-09-22 DIAGNOSIS — Z79899 Other long term (current) drug therapy: Secondary | ICD-10-CM | POA: Diagnosis not present

## 2022-09-22 DIAGNOSIS — Z86718 Personal history of other venous thrombosis and embolism: Secondary | ICD-10-CM

## 2022-09-22 DIAGNOSIS — Z7901 Long term (current) use of anticoagulants: Secondary | ICD-10-CM | POA: Diagnosis not present

## 2022-09-22 DIAGNOSIS — F17290 Nicotine dependence, other tobacco product, uncomplicated: Secondary | ICD-10-CM | POA: Diagnosis not present

## 2022-09-22 DIAGNOSIS — I639 Cerebral infarction, unspecified: Secondary | ICD-10-CM | POA: Diagnosis not present

## 2022-09-22 DIAGNOSIS — R4701 Aphasia: Secondary | ICD-10-CM | POA: Insufficient documentation

## 2022-09-22 DIAGNOSIS — N4889 Other specified disorders of penis: Secondary | ICD-10-CM | POA: Diagnosis not present

## 2022-09-22 DIAGNOSIS — R0683 Snoring: Secondary | ICD-10-CM

## 2022-09-22 DIAGNOSIS — J90 Pleural effusion, not elsewhere classified: Secondary | ICD-10-CM | POA: Diagnosis not present

## 2022-09-22 DIAGNOSIS — I5022 Chronic systolic (congestive) heart failure: Secondary | ICD-10-CM | POA: Insufficient documentation

## 2022-09-22 DIAGNOSIS — I428 Other cardiomyopathies: Secondary | ICD-10-CM | POA: Insufficient documentation

## 2022-09-22 DIAGNOSIS — Z8673 Personal history of transient ischemic attack (TIA), and cerebral infarction without residual deficits: Secondary | ICD-10-CM | POA: Insufficient documentation

## 2022-09-22 DIAGNOSIS — Z720 Tobacco use: Secondary | ICD-10-CM

## 2022-09-22 DIAGNOSIS — F1410 Cocaine abuse, uncomplicated: Secondary | ICD-10-CM | POA: Diagnosis not present

## 2022-09-22 DIAGNOSIS — E669 Obesity, unspecified: Secondary | ICD-10-CM | POA: Insufficient documentation

## 2022-09-22 LAB — DIGOXIN LEVEL: Digoxin Level: 1.1 ng/mL (ref 0.8–2.0)

## 2022-09-22 LAB — CBC
HCT: 42.1 % (ref 39.0–52.0)
Hemoglobin: 13.9 g/dL (ref 13.0–17.0)
MCH: 32 pg (ref 26.0–34.0)
MCHC: 33 g/dL (ref 30.0–36.0)
MCV: 97 fL (ref 80.0–100.0)
Platelets: 208 10*3/uL (ref 150–400)
RBC: 4.34 MIL/uL (ref 4.22–5.81)
RDW: 13.8 % (ref 11.5–15.5)
WBC: 4.5 10*3/uL (ref 4.0–10.5)
nRBC: 0 % (ref 0.0–0.2)

## 2022-09-22 LAB — BASIC METABOLIC PANEL
Anion gap: 11 (ref 5–15)
BUN: 11 mg/dL (ref 6–20)
CO2: 26 mmol/L (ref 22–32)
Calcium: 9.4 mg/dL (ref 8.9–10.3)
Chloride: 98 mmol/L (ref 98–111)
Creatinine, Ser: 1.14 mg/dL (ref 0.61–1.24)
GFR, Estimated: >60 mL/min (ref >60)
Glucose, Bld: 81 mg/dL (ref 70–99)
Potassium: 4.1 mmol/L (ref 3.5–5.1)
Sodium: 135 mmol/L (ref 135–145)

## 2022-09-22 LAB — BRAIN NATRIURETIC PEPTIDE: B Natriuretic Peptide: 307.6 pg/mL — ABNORMAL HIGH (ref 0.0–100.0)

## 2022-09-22 NOTE — Patient Instructions (Signed)
It was great to see you today! No medication changes are needed at this time.  Please try OTC Lotramine cream-apply to affected area as needed  Your physician wants you to follow-up in: 3 months with DR Bensimhon. You will receive a reminder letter in the mail two months in advance. If you don't receive a letter, please call our office to schedule the follow-up appointment.   Please schedule a follow up with PCP Mariann Laster [phone]   Do the following things EVERYDAY: Weigh yourself in the morning before breakfast. Write it down and keep it in a log. Take your medicines as prescribed Eat low salt foods--Limit salt (sodium) to 2000 mg per day.  Stay as active as you can everyday Limit all fluids for the day to less than 2 liters  At the [facility], you and your health needs are our priority. As part of our continuing mission to provide you with exceptional heart care, we have created designated Provider Care Teams. These Care Teams include your primary Cardiologist (physician) and Advanced Practice Providers (APPs- Physician Assistants and Nurse Practitioners) who all work together to provide you with the care you need, when you need it.   You may see any of the following providers on your designated Care Team at your next follow up: Dr Glori Bickers Dr Loralie Champagne Dr. Roxana Hires, NP Lyda Jester, [place] [facility] [place], [place] Forestine Na, NP Audry Riles, PharmD   Please be sure to bring in all your medications bottles to every appointment.    Thank you for choosing [facility]   If you have any questions or concerns before your next appointment please send [place] a message through [place] or call our office at [phone].    TO LEAVE A MESSAGE FOR THE NURSE SELECT OPTION 2, PLEASE LEAVE A MESSAGE INCLUDING: YOUR NAME DATE OF BIRTH CALL BACK NUMBER REASON FOR CALL**this is  important as we prioritize the call backs  YOU WILL RECEIVE A CALL BACK THE SAME DAY AS LONG AS YOU CALL BEFORE 4:00 PM

## 2022-09-25 ENCOUNTER — Telehealth (HOSPITAL_COMMUNITY): Payer: Self-pay | Admitting: Cardiology

## 2022-09-25 DIAGNOSIS — I5022 Chronic systolic (congestive) heart failure: Secondary | ICD-10-CM

## 2022-09-25 NOTE — Telephone Encounter (Signed)
Patient called.  Patient aware via mom Phone: [phone] (H)

## 2022-09-25 NOTE — Telephone Encounter (Signed)
-----   Message from Rafael Bihari, [place] sent at 09/22/2022  1:30 PM EST ----- Labs ok, but dig level elevated.  Please repeat as a trough in next week or two, needs to hold his digoxin before lab draw

## 2022-09-27 ENCOUNTER — Ambulatory Visit: Payer: Self-pay | Admitting: Nurse Practitioner

## 2022-09-27 ENCOUNTER — Other Ambulatory Visit (HOSPITAL_COMMUNITY): Payer: BLUE CROSS/BLUE SHIELD

## 2022-09-28 ENCOUNTER — Ambulatory Visit (INDEPENDENT_AMBULATORY_CARE_PROVIDER_SITE_OTHER): Payer: BLUE CROSS/BLUE SHIELD | Admitting: Nurse Practitioner

## 2022-09-28 ENCOUNTER — Other Ambulatory Visit (HOSPITAL_COMMUNITY): Payer: Self-pay

## 2022-09-28 ENCOUNTER — Encounter: Payer: Self-pay | Admitting: Nurse Practitioner

## 2022-09-28 VITALS — BP 118/60 | HR 53 | Temp 97.7°F | Wt 174.4 lb

## 2022-09-28 DIAGNOSIS — K5900 Constipation, unspecified: Secondary | ICD-10-CM | POA: Diagnosis not present

## 2022-09-28 DIAGNOSIS — B372 Candidiasis of skin and nail: Secondary | ICD-10-CM | POA: Diagnosis not present

## 2022-09-28 DIAGNOSIS — Z113 Encounter for screening for infections with a predominantly sexual mode of transmission: Secondary | ICD-10-CM

## 2022-09-28 DIAGNOSIS — R300 Dysuria: Secondary | ICD-10-CM

## 2022-09-28 LAB — POCT URINALYSIS DIP (CLINITEK)
Bilirubin, UA: NEGATIVE
Blood, UA: NEGATIVE
Glucose, UA: 500 mg/dL — AB
Ketones, POC UA: NEGATIVE mg/dL
Leukocytes, UA: NEGATIVE
Nitrite, UA: NEGATIVE
POC PROTEIN,UA: NEGATIVE
Spec Grav, UA: 1.02 (ref 1.010–1.025)
Urobilinogen, UA: 0.2 E.U./dL
pH, UA: 6.5 (ref 5.0–8.0)

## 2022-09-28 MED ORDER — CLOTRIMAZOLE 1 % EX CREA
1.0000 | TOPICAL_CREAM | Freq: Two times a day (BID) | CUTANEOUS | 0 refills | Status: DC
  Filled 2022-09-28: qty 30, 15d supply, fill #0

## 2022-09-28 MED ORDER — TRAZODONE HCL 50 MG PO TABS
25.0000 mg | ORAL_TABLET | Freq: Every evening | ORAL | 3 refills | Status: DC | PRN
  Filled 2022-09-28: qty 30, 30d supply, fill #0
  Filled 2022-10-24: qty 30, 30d supply, fill #1
  Filled 2022-11-22: qty 30, 30d supply, fill #2
  Filled 2022-12-21: qty 30, 30d supply, fill #3

## 2022-09-28 MED ORDER — DOCUSATE SODIUM 100 MG PO CAPS
100.0000 mg | ORAL_CAPSULE | Freq: Two times a day (BID) | ORAL | 0 refills | Status: DC
  Filled 2022-09-28: qty 10, 5d supply, fill #0

## 2022-09-28 MED ORDER — HYDROCORTISONE (PERIANAL) 2.5 % EX CREA
1.0000 | TOPICAL_CREAM | Freq: Two times a day (BID) | CUTANEOUS | 0 refills | Status: DC
  Filled 2022-09-28: qty 30, 10d supply, fill #0

## 2022-09-28 NOTE — Progress Notes (Signed)
$'@Patient'a$  ID: Carlos Mckinney, male    DOB: 16-Jun-1996, 27 y.o.   MRN: [id number]  Chief Complaint  Patient presents with   Follow-up    Medications     Referring provider: Fenton Foy, NP   HPI  Carlos Mckinney is a 27 y.o.male with hx tobacco use, cocaine abuse, obesity, pulmonary embolism, CVA, heart failure, anemia   Patient presents today for follow-up visit.  He is here with his mother today.  Patient complains today of blood in stool.  He also complains of irritation to his genital area.  We will check STD screening and a UA in office today.  We will trial Lotrimin cream and hemorrhoid cream.  Patient states that he has had issues with constipation.  We will order stool softener. Denies f/c/s, n/v/d, hemoptysis, PND, leg swelling Denies chest pain or edema        No Known Allergies  Immunization History  Administered Date(s) Administered   DTaP 12/04/1994, 02/12/1995, 04/26/1995   HIB (PRP-OMP) 12/04/1994, 02/12/1995, 04/26/1995   HPV Quadrivalent 06/29/2010   Hepatitis B 04-20-1995, 12/04/1994, 04/26/1995   IPV 12/04/1994, 02/12/1995, 04/26/1995   Influenza-Unspecified 06/29/2010   Janssen (J&J) SARS-COV-2 Vaccination 10/29/2019   MMR 09/27/1995    Past Medical History:  Diagnosis Date   Heart failure ([place])    Hypertension    Polysubstance abuse ([place])     Tobacco History: Social History   Tobacco Use  Smoking Status Former   Types: Cigarettes  Smokeless Tobacco Never   Counseling given: Not Answered   Outpatient Encounter Medications as of 09/28/2022  Medication Sig   clotrimazole (LOTRIMIN AF JOCK ITCH) 1 % cream Apply 1 Application topically 2 (two) times daily.   dapagliflozin propanediol (FARXIGA) 10 MG TABS tablet Take 1 tablet (10 mg total) by mouth daily before breakfast.   digoxin (LANOXIN) 0.125 MG tablet Take 1 tablet (0.125 mg total) by mouth daily.   docusate sodium (COLACE) 100 MG capsule Take 1 capsule (100 mg total) by mouth 2 (two)  times daily.   furosemide (LASIX) 40 MG tablet Take 1 tablet (40 mg total) by mouth daily.   hydrocortisone (ANUSOL-HC) 2.5 % rectal cream Place 1 Application rectally 2 (two) times daily.   sacubitril-valsartan (ENTRESTO) 49-51 MG Take 1 tablet by mouth 2 (two) times daily.   spironolactone (ALDACTONE) 25 MG tablet Take 1 tablet (25 mg total) by mouth daily.   traZODone (DESYREL) 50 MG tablet Take 1/2-1 tablet (25-50 mg total) by mouth at bedtime as needed for sleep.   albuterol (VENTOLIN HFA) 108 (90 Base) MCG/ACT inhaler Inhale 1-2 puffs into the lungs every 6 (six) hours as needed for wheezing or shortness of breath. (Patient not taking: Reported on 09/28/2022)   potassium chloride SA (KLOR-CON M) 20 MEQ tablet Take 1 tablet (20 mEq total) by mouth daily. (Patient not taking: Reported on 09/28/2022)   rivaroxaban (XARELTO) 20 MG TABS tablet Take 1 tablet (20 mg total) by mouth daily.   No facility-administered encounter medications on file as of 09/28/2022.     Review of Systems  Review of Systems  Constitutional: Negative.   HENT: Negative.    Cardiovascular: Negative.   Gastrointestinal:  Positive for blood in stool and constipation.  Genitourinary:        Penile irritation  Allergic/Immunologic: Negative.   Neurological: Negative.   Psychiatric/Behavioral: Negative.         Physical Exam  BP 118/60   Pulse (!) 53   Temp 97.7 F (  36.5 C)   Wt 174 lb 6.4 oz (79.1 kg)   SpO2 100%   BMI 24.32 kg/m   Wt Readings from Last 5 Encounters:  09/28/22 174 lb 6.4 oz (79.1 kg)  09/22/22 178 lb 9.6 oz (81 kg)  06/06/22 175 lb 6.4 oz (79.6 kg)  04/27/22 154 lb 12.8 oz (70.2 kg)  01/20/22 181 lb 3.2 oz (82.2 kg)     Physical Exam Vitals and nursing note reviewed.  Constitutional:      General: He is not in acute distress.    Appearance: He is well-developed.  Cardiovascular:     Rate and Rhythm: Normal rate and regular rhythm.  Pulmonary:     Effort: Pulmonary effort is  normal.     Breath sounds: Normal breath sounds.  Skin:    General: Skin is warm and dry.  Neurological:     Mental Status: He is alert and oriented to person, place, and time.       Assessment & Plan:   Constipation - docusate sodium (COLACE) 100 MG capsule; Take 1 capsule (100 mg total) by mouth 2 (two) times daily.  Dispense: 10 capsule; Refill: 0 - hydrocortisone (ANUSOL-HC) 2.5 % rectal cream; Place 1 Application rectally 2 (two) times daily.  Dispense: 30 g; Refill: 0  2. Yeast dermatitis  - clotrimazole (LOTRIMIN AF JOCK ITCH) 1 % cream; Apply 1 Application topically 2 (two) times daily.  Dispense: 30 g; Refill: 0  3. Dysuria  - POCT URINALYSIS DIP (CLINITEK)  4. Screen for STD (sexually transmitted disease)  - RPR+HIV+GC+CT Panel   Follow up:  Follow up in 1 month     Fenton Foy, NP 09/28/2022

## 2022-09-28 NOTE — Patient Instructions (Addendum)
1. Constipation, unspecified constipation type  - docusate sodium (COLACE) 100 MG capsule; Take 1 capsule (100 mg total) by mouth 2 (two) times daily.  Dispense: 10 capsule; Refill: 0 - hydrocortisone (ANUSOL-HC) 2.5 % rectal cream; Place 1 Application rectally 2 (two) times daily.  Dispense: 30 g; Refill: 0  2. Yeast dermatitis  - clotrimazole (LOTRIMIN AF JOCK ITCH) 1 % cream; Apply 1 Application topically 2 (two) times daily.  Dispense: 30 g; Refill: 0  3. Dysuria  - POCT URINALYSIS DIP (CLINITEK)  4. Screen for STD (sexually transmitted disease)  - RPR+HIV+GC+CT Panel   Follow up:  Follow up in 1 month

## 2022-09-28 NOTE — Assessment & Plan Note (Signed)
-   docusate sodium (COLACE) 100 MG capsule; Take 1 capsule (100 mg total) by mouth 2 (two) times daily.  Dispense: 10 capsule; Refill: 0 - hydrocortisone (ANUSOL-HC) 2.5 % rectal cream; Place 1 Application rectally 2 (two) times daily.  Dispense: 30 g; Refill: 0  2. Yeast dermatitis  - clotrimazole (LOTRIMIN AF JOCK ITCH) 1 % cream; Apply 1 Application topically 2 (two) times daily.  Dispense: 30 g; Refill: 0  3. Dysuria  - POCT URINALYSIS DIP (CLINITEK)  4. Screen for STD (sexually transmitted disease)  - RPR+HIV+GC+CT Panel   Follow up:  Follow up in 1 month

## 2022-09-29 ENCOUNTER — Other Ambulatory Visit (HOSPITAL_COMMUNITY): Payer: Self-pay

## 2022-09-30 ENCOUNTER — Other Ambulatory Visit (HOSPITAL_COMMUNITY): Payer: Self-pay

## 2022-10-02 LAB — RPR+HIV+GC+CT PANEL
Chlamydia trachomatis, NAA: NEGATIVE
HIV Screen 4th Generation wRfx: NONREACTIVE
Neisseria Gonorrhoeae by PCR: NEGATIVE
RPR Ser Ql: NONREACTIVE

## 2022-10-03 ENCOUNTER — Other Ambulatory Visit: Payer: Self-pay

## 2022-10-04 ENCOUNTER — Other Ambulatory Visit (HOSPITAL_COMMUNITY): Payer: Self-pay

## 2022-10-05 ENCOUNTER — Other Ambulatory Visit (HOSPITAL_COMMUNITY): Payer: Self-pay

## 2022-10-06 ENCOUNTER — Other Ambulatory Visit (HOSPITAL_COMMUNITY): Payer: Self-pay

## 2022-10-17 ENCOUNTER — Other Ambulatory Visit (HOSPITAL_COMMUNITY): Payer: Self-pay

## 2022-10-18 ENCOUNTER — Other Ambulatory Visit (HOSPITAL_COMMUNITY): Payer: Self-pay

## 2022-10-20 ENCOUNTER — Other Ambulatory Visit (HOSPITAL_COMMUNITY): Payer: Self-pay

## 2022-10-24 ENCOUNTER — Other Ambulatory Visit (HOSPITAL_COMMUNITY): Payer: Self-pay

## 2022-10-25 NOTE — Progress Notes (Deleted)
NEUROLOGY CONSULTATION NOTE  KU LEISNER MRN: [id number] DOB: August 08, 1995  Referring provider: Glori Bickers, MD Primary care provider: Lazaro Arms, NP  Reason for consult:  stroke  Assessment/Plan:   *** History of bilateral MCA infarcts with right M2 occlusion s/p thrombectomy likely secondary to cardiomyopathy and noncompliance with Eliquis Congestive heart failure  History of DVT/PE on Eliquis Tobacco abuse Cocaine abuse   Recommendations for secondary stroke prevention: Follow up as needed   Subjective:  Carlos Mckinney is a 27 year old ***-handed male with CHF, DVT on Eliquis, hypertension tobacco abuse and cocaine abuse and history of cardiogenic shock who presents for history of stroke.  History supplemented by hospital records and referring provider's notes.  On 09/20/2021, he was found by family with decreased level of consciousness, disorientation, slurred speech, left sided weakness and difficulty walking.  Seen at [place] to be globally aphasic with right gaze-preference, left hemianopsia, left facial droop and left-sided weakness and sensory neglect.  NIH SS 24.  Not TNK candidate due to Eliquis.    CTA head and neck showed suspected right M2 MCA occlusion vs severe stenosis.  MRI of brain revealed large right MCA territory infarct and moderate-to-large left temporal lobe infarct.  He underwent endovascular thrombectomy/T1C1 2C revascularization of the occluded right superior division MCA.  2D echo revealed EF less than 20% with LV global hypokinesis and severe tricuspid regurgitation.  LDL 45 and Hgb A1c 4.5.  UDS was positive for cocaine.  He was on Eliquis after diagnosed with DVTs and PEs the previous month but had been noncompliant.  He was started on ASA 325mg .  Patient refused inpatient rehab and was discharged home.  ***  09/20/2021 CT HEAD:    there is suspected hypoattenuation of the right insula which may represent sequela of acute  right MCA territory infarct. Streak artifact in this region does limit evaluation and recommend correlation with the forthcoming/ordered MRI. 09/20/2021 CT PERFUSION:  Motion limited with RAPID reporting areas of penumbra in the right anterior MCA territory and the left posterior MCA territory. Overall penumbra is estimated to be 17 mL. No core infarct identified. 09/20/2021 CTA HEAD:  Severely limited study due to motion and venous timing. M1 MCAs patent with suspected right superior M2 MCA occlusion or severe stenosis in the region of right anterior MCA territory penumbra described above. 09/20/2021 CTA NECK:  Severely motion limited study. The visualized common carotid arteries and internal carotid arteries appear to be grossly patent.  Nondiagnostic evaluation of the vertebral arteries. Also, nondiagnostic evaluation for stenosis. 09/21/2021 MRI BRAIN WO:  1. Status post thrombectomy with large area of restricted diffusion in the right MCA territory, in both the right frontal and temporal lobes, consistent with acute infarcts. 2. Additional moderate to large acute infarct in the left temporal lobe in the left MCA territory.   PAST MEDICAL HISTORY: Past Medical History:  Diagnosis Date   Heart failure ([place])    Hypertension    Polysubstance abuse ([place])     PAST SURGICAL HISTORY: Past Surgical History:  Procedure Laterality Date   IR CT HEAD LTD  09/22/2021   IR PERCUTANEOUS ART THROMBECTOMY/INFUSION INTRACRANIAL INC DIAG ANGIO  09/20/2021   RADIOLOGY WITH ANESTHESIA N/A 09/20/2021   Procedure: IR WITH ANESTHESIA;  Surgeon: Radiologist, Medication, MD;  Location: [facility];  Service: Radiology;  Laterality: N/A;    MEDICATIONS: Current Outpatient Medications on File Prior to Visit  Medication Sig Dispense Refill   albuterol (VENTOLIN  HFA) 108 (90 Base) MCG/ACT inhaler Inhale 1-2 puffs into the lungs every 6 (six) hours as needed for wheezing or shortness of breath. (Patient not taking:  Reported on 09/28/2022)     clotrimazole (LOTRIMIN AF JOCK ITCH) 1 % cream Apply 1 Application topically 2 (two) times daily. 30 g 0   dapagliflozin propanediol (FARXIGA) 10 MG TABS tablet Take 1 tablet (10 mg total) by mouth daily before breakfast. 30 tablet 6   digoxin (LANOXIN) 0.125 MG tablet Take 1 tablet (0.125 mg total) by mouth daily. 30 tablet 6   docusate sodium (COLACE) 100 MG capsule Take 1 capsule (100 mg total) by mouth 2 (two) times daily. 10 capsule 0   furosemide (LASIX) 40 MG tablet Take 1 tablet (40 mg total) by mouth daily. 90 tablet 3   hydrocortisone (ANUSOL-HC) 2.5 % rectal cream Place 1 Application rectally 2 (two) times daily. 30 g 0   potassium chloride SA (KLOR-CON M) 20 MEQ tablet Take 1 tablet (20 mEq total) by mouth daily. (Patient not taking: Reported on 09/28/2022) 60 tablet 5   rivaroxaban (XARELTO) 20 MG TABS tablet Take 1 tablet (20 mg total) by mouth daily. 30 tablet 11   sacubitril-valsartan (ENTRESTO) 49-51 MG Take 1 tablet by mouth 2 (two) times daily. 180 tablet 3   spironolactone (ALDACTONE) 25 MG tablet Take 1 tablet (25 mg total) by mouth daily. 90 tablet 3   traZODone (DESYREL) 50 MG tablet Take 1/2-1 tablet (25-50 mg total) by mouth at bedtime as needed for sleep. 30 tablet 3   No current facility-administered medications on file prior to visit.    ALLERGIES: No Known Allergies  FAMILY HISTORY: Family History  Problem Relation Age of Onset   Hypertension Mother     Objective:  *** General: No acute distress.  Patient appears well-groomed.   Head:  Normocephalic/atraumatic Eyes:  fundi examined but not visualized Neck: supple, no paraspinal tenderness, full range of motion Back: No paraspinal tenderness Heart: regular rate and rhythm Lungs: Clear to auscultation bilaterally. Vascular: No carotid bruits. Neurological Exam: Mental status: alert and oriented to person, place, and time, speech fluent and not dysarthric, language  intact. Cranial nerves: CN I: not tested CN II: pupils equal, round and reactive to light, visual fields intact CN III, IV, VI:  full range of motion, no nystagmus, no ptosis CN V: facial sensation intact. CN VII: upper and lower face symmetric CN VIII: hearing intact CN IX, X: gag intact, uvula midline CN XI: sternocleidomastoid and trapezius muscles intact CN XII: tongue midline Bulk & Tone: normal, no fasciculations. Motor:  muscle strength 5/5 throughout Sensation:  Pinprick, temperature and vibratory sensation intact. Deep Tendon Reflexes:  2+ throughout,  toes downgoing.   Finger to nose testing:  Without dysmetria.   Heel to shin:  Without dysmetria.   Gait:  Normal station and stride.  Romberg negative.    Thank you for allowing me to take part in the care of this patient.  Metta Clines, DO  CC: ***

## 2022-10-26 ENCOUNTER — Ambulatory Visit: Payer: BLUE CROSS/BLUE SHIELD | Admitting: Neurology

## 2022-10-26 ENCOUNTER — Ambulatory Visit: Payer: Self-pay | Admitting: Nurse Practitioner

## 2022-10-26 ENCOUNTER — Encounter: Payer: Self-pay | Admitting: Neurology

## 2022-11-02 ENCOUNTER — Ambulatory Visit: Payer: BLUE CROSS/BLUE SHIELD | Admitting: Nurse Practitioner

## 2022-11-02 ENCOUNTER — Other Ambulatory Visit (HOSPITAL_COMMUNITY): Payer: Self-pay

## 2022-11-02 VITALS — BP 126/71 | HR 54 | Temp 97.2°F | Ht 74.0 in | Wt 178.8 lb

## 2022-11-02 DIAGNOSIS — B372 Candidiasis of skin and nail: Secondary | ICD-10-CM | POA: Diagnosis not present

## 2022-11-02 DIAGNOSIS — R81 Glycosuria: Secondary | ICD-10-CM | POA: Diagnosis not present

## 2022-11-02 LAB — POCT GLYCOSYLATED HEMOGLOBIN (HGB A1C): Hemoglobin A1C: 4.9 % (ref 4.0–5.6)

## 2022-11-02 MED ORDER — CLOTRIMAZOLE 1 % EX CREA
1.0000 | TOPICAL_CREAM | Freq: Two times a day (BID) | CUTANEOUS | 0 refills | Status: DC
  Filled 2022-11-02: qty 30, 15d supply, fill #0

## 2022-11-02 NOTE — Progress Notes (Signed)
@Patient  ID: Carlos Mckinney, male    DOB: [birth year], 27 y.o.   MRN: [id number]  Chief Complaint  Patient presents with   Follow-up    Referring provider: Ivonne Andrew, NP   HPI   Patient presents today for a follow-up.  At his last visit it was noted that there was glucose in his urine.  We will screen him for diabetes today. Denies f/c/s, n/v/d, hemoptysis, PND, leg swelling Denies chest pain or edema       No Known Allergies  Immunization History  Administered Date(s) Administered   DTaP 12/04/1994, 02/12/1995, 04/26/1995   HIB (PRP-OMP) 12/04/1994, 02/12/1995, 04/26/1995   HPV Quadrivalent 06/29/2010   Hepatitis B 04/09/95, 12/04/1994, 04/26/1995   IPV 12/04/1994, 02/12/1995, 04/26/1995   Influenza-Unspecified 06/29/2010   Janssen (J&J) SARS-COV-2 Vaccination 10/29/2019   MMR 09/27/1995    Past Medical History:  Diagnosis Date   Heart failure (HCC)    Hypertension    Polysubstance abuse (HCC)     Tobacco History: Social History   Tobacco Use  Smoking Status Former   Types: Cigarettes  Smokeless Tobacco Never   Counseling given: Not Answered   Outpatient Encounter Medications as of 11/02/2022  Medication Sig   albuterol (VENTOLIN HFA) 108 (90 Base) MCG/ACT inhaler Inhale 1-2 puffs into the lungs every 6 (six) hours as needed for wheezing or shortness of breath.   dapagliflozin propanediol (FARXIGA) 10 MG TABS tablet Take 1 tablet (10 mg total) by mouth daily before breakfast.   digoxin (LANOXIN) 0.125 MG tablet Take 1 tablet (0.125 mg total) by mouth daily.   furosemide (LASIX) 40 MG tablet Take 1 tablet (40 mg total) by mouth daily.   hydrocortisone (ANUSOL-HC) 2.5 % rectal cream Place 1 Application rectally 2 (two) times daily.   rivaroxaban (XARELTO) 20 MG TABS tablet Take 1 tablet (20 mg total) by mouth daily.   sacubitril-valsartan (ENTRESTO) 49-51 MG Take 1 tablet by mouth 2 (two) times daily.   spironolactone (ALDACTONE) 25 MG tablet Take  1 tablet (25 mg total) by mouth daily.   traZODone (DESYREL) 50 MG tablet Take 1/2-1 tablet (25-50 mg total) by mouth at bedtime as needed for sleep.   [DISCONTINUED] clotrimazole (LOTRIMIN AF JOCK ITCH) 1 % cream Apply 1 Application topically 2 (two) times daily.   clotrimazole (LOTRIMIN AF JOCK ITCH) 1 % cream Apply 1 Application topically 2 (two) times daily.   docusate sodium (COLACE) 100 MG capsule Take 1 capsule (100 mg total) by mouth 2 (two) times daily. (Patient not taking: Reported on 11/02/2022)   potassium chloride SA (KLOR-CON M) 20 MEQ tablet Take 1 tablet (20 mEq total) by mouth daily. (Patient not taking: Reported on 09/28/2022)   No facility-administered encounter medications on file as of 11/02/2022.     Review of Systems  Review of Systems  Constitutional: Negative.   HENT: Negative.    Cardiovascular: Negative.   Gastrointestinal: Negative.   Allergic/Immunologic: Negative.   Neurological: Negative.   Psychiatric/Behavioral: Negative.         Physical Exam  BP 126/71   Pulse (!) 54   Temp (!) 97.2 F (36.2 C)   Ht 6\' 2"  (1.88 m)   Wt 178 lb 12.8 oz (81.1 kg)   SpO2 100%   BMI 22.96 kg/m   Wt Readings from Last 5 Encounters:  11/02/22 178 lb 12.8 oz (81.1 kg)  09/28/22 174 lb 6.4 oz (79.1 kg)  09/22/22 178 lb 9.6 oz (81 kg)  06/06/22 175  lb 6.4 oz (79.6 kg)  04/27/22 154 lb 12.8 oz (70.2 kg)     Physical Exam Vitals and nursing note reviewed.  Constitutional:      General: He is not in acute distress.    Appearance: He is well-developed.  Cardiovascular:     Rate and Rhythm: Normal rate and regular rhythm.  Pulmonary:     Effort: Pulmonary effort is normal.     Breath sounds: Normal breath sounds.  Skin:    General: Skin is warm and dry.  Neurological:     Mental Status: He is alert and oriented to person, place, and time.      Lab Results:  CBC    Component Value Date/Time   WBC 4.5 09/22/2022 0908   RBC 4.34 09/22/2022 0908   HGB  13.9 09/22/2022 0908   HCT 42.1 09/22/2022 0908   PLT 208 09/22/2022 0908   MCV 97.0 09/22/2022 0908   MCH 32.0 09/22/2022 0908   MCHC 33.0 09/22/2022 0908   RDW 13.8 09/22/2022 0908   LYMPHSABS 1.2 09/24/2021 0052   MONOABS 0.8 09/24/2021 0052   EOSABS 0.2 09/24/2021 0052   BASOSABS 0.0 09/24/2021 0052    BMET    Component Value Date/Time   NA 135 09/22/2022 0908   K 4.1 09/22/2022 0908   CL 98 09/22/2022 0908   CO2 26 09/22/2022 0908   GLUCOSE 81 09/22/2022 0908   BUN 11 09/22/2022 0908   CREATININE 1.14 09/22/2022 0908   CALCIUM 9.4 09/22/2022 0908   GFRNONAA >60 09/22/2022 0908    BNP    Component Value Date/Time   BNP 307.6 (H) 09/22/2022 0908     Assessment & Plan:   Yeast dermatitis - clotrimazole (LOTRIMIN AF JOCK ITCH) 1 % cream; Apply 1 Application topically 2 (two) times daily.  Dispense: 30 g; Refill: 0  2. Glucose found in urine on examination  - HgB A1c  Lab Results  Component Value Date   HGBA1C 4.9 11/02/2022     Follow up:  Follow up as scheduled     Ivonne Andrew, NP 11/06/2022

## 2022-11-03 ENCOUNTER — Encounter: Payer: Self-pay | Admitting: Neurology

## 2022-11-06 ENCOUNTER — Encounter: Payer: Self-pay | Admitting: Nurse Practitioner

## 2022-11-06 DIAGNOSIS — B372 Candidiasis of skin and nail: Secondary | ICD-10-CM | POA: Insufficient documentation

## 2022-11-06 NOTE — Patient Instructions (Signed)
1. Yeast dermatitis  - clotrimazole (LOTRIMIN AF JOCK ITCH) 1 % cream; Apply 1 Application topically 2 (two) times daily.  Dispense: 30 g; Refill: 0  2. Glucose found in urine on examination  - HgB A1c  Lab Results  Component Value Date   HGBA1C 4.9 11/02/2022     Follow up:  Follow up as scheduled

## 2022-11-06 NOTE — Assessment & Plan Note (Signed)
-   clotrimazole (LOTRIMIN AF JOCK ITCH) 1 % cream; Apply 1 Application topically 2 (two) times daily.  Dispense: 30 g; Refill: 0  2. Glucose found in urine on examination  - HgB A1c  Lab Results  Component Value Date   HGBA1C 4.9 11/02/2022     Follow up:  Follow up as scheduled

## 2022-11-10 ENCOUNTER — Other Ambulatory Visit (HOSPITAL_COMMUNITY): Payer: Self-pay

## 2022-11-15 ENCOUNTER — Other Ambulatory Visit: Payer: Self-pay

## 2022-11-15 ENCOUNTER — Telehealth: Payer: Self-pay | Admitting: Nurse Practitioner

## 2022-11-15 ENCOUNTER — Other Ambulatory Visit (HOSPITAL_COMMUNITY): Payer: Self-pay

## 2022-11-15 ENCOUNTER — Other Ambulatory Visit (HOSPITAL_COMMUNITY): Payer: Self-pay | Admitting: Internal Medicine

## 2022-11-15 MED ORDER — RIVAROXABAN 20 MG PO TABS
20.0000 mg | ORAL_TABLET | Freq: Every day | ORAL | 11 refills | Status: DC
  Filled 2022-11-15: qty 30, 30d supply, fill #0
  Filled 2022-12-06 – 2022-12-11 (×2): qty 30, 30d supply, fill #1
  Filled 2023-01-29: qty 30, 30d supply, fill #2
  Filled 2023-03-13: qty 30, 30d supply, fill #3
  Filled 2023-04-11: qty 30, 30d supply, fill #4
  Filled 2023-05-24: qty 30, 30d supply, fill #5
  Filled 2023-07-02: qty 30, 30d supply, fill #6
  Filled 2023-08-03: qty 30, 30d supply, fill #7
  Filled 2023-09-13: qty 30, 30d supply, fill #8
  Filled 2023-10-29: qty 30, 30d supply, fill #9

## 2022-11-15 NOTE — Telephone Encounter (Signed)
Caller & Relationship to patient:  MRN #  [id number]   Call Back Number:   Date of Last Office Visit: 11/02/2022     Date of Next Office Visit: 02/02/2023    Medication(s) to be Refilled: Tramadol   Preferred Pharmacy:   ** Please notify patient to allow 48-72 hours to process** **Let patient know to contact pharmacy at the end of the day to make sure medication is ready. ** **If patient has not been seen in a year or longer, book an appointment **Advise to use MyChart for refill requests OR to contact their pharmacy

## 2022-11-16 ENCOUNTER — Other Ambulatory Visit: Payer: Self-pay | Admitting: Nurse Practitioner

## 2022-11-16 ENCOUNTER — Other Ambulatory Visit (HOSPITAL_COMMUNITY): Payer: Self-pay

## 2022-11-22 ENCOUNTER — Other Ambulatory Visit (HOSPITAL_COMMUNITY): Payer: Self-pay

## 2022-11-24 ENCOUNTER — Other Ambulatory Visit (HOSPITAL_COMMUNITY): Payer: Self-pay

## 2022-12-03 ENCOUNTER — Other Ambulatory Visit (HOSPITAL_COMMUNITY): Payer: Self-pay

## 2022-12-06 ENCOUNTER — Other Ambulatory Visit (HOSPITAL_COMMUNITY): Payer: Self-pay

## 2022-12-11 ENCOUNTER — Other Ambulatory Visit (HOSPITAL_COMMUNITY): Payer: Self-pay

## 2022-12-19 ENCOUNTER — Other Ambulatory Visit (HOSPITAL_COMMUNITY): Payer: Self-pay

## 2022-12-21 ENCOUNTER — Other Ambulatory Visit (HOSPITAL_COMMUNITY): Payer: Self-pay

## 2022-12-28 ENCOUNTER — Telehealth (HOSPITAL_COMMUNITY): Payer: Self-pay

## 2022-12-28 NOTE — Telephone Encounter (Signed)
error 

## 2022-12-28 NOTE — Telephone Encounter (Signed)
I spoke to his mother and updated about information.

## 2022-12-28 NOTE — Telephone Encounter (Signed)
Patient's mother called and stated he is bleeding rectally. It is not when having stool, it is bleeding at other times. No other symptoms are noted. I asked if they had discussed with PCP and she said they referred to [place] because it could be from medication. Please advise.

## 2023-01-02 ENCOUNTER — Other Ambulatory Visit (HOSPITAL_COMMUNITY): Payer: Self-pay

## 2023-01-08 ENCOUNTER — Other Ambulatory Visit (HOSPITAL_COMMUNITY): Payer: Self-pay | Admitting: Internal Medicine

## 2023-01-08 ENCOUNTER — Other Ambulatory Visit (HOSPITAL_COMMUNITY): Payer: Self-pay

## 2023-01-08 MED ORDER — DAPAGLIFLOZIN PROPANEDIOL 10 MG PO TABS
10.0000 mg | ORAL_TABLET | Freq: Every day | ORAL | 0 refills | Status: DC
  Filled 2023-01-08: qty 90, 90d supply, fill #0

## 2023-01-25 ENCOUNTER — Other Ambulatory Visit (HOSPITAL_COMMUNITY): Payer: Self-pay

## 2023-01-29 ENCOUNTER — Other Ambulatory Visit (HOSPITAL_COMMUNITY): Payer: Self-pay

## 2023-01-29 ENCOUNTER — Other Ambulatory Visit: Payer: Self-pay | Admitting: Nurse Practitioner

## 2023-01-30 NOTE — Telephone Encounter (Signed)
Please advise. kh 

## 2023-01-31 ENCOUNTER — Other Ambulatory Visit (HOSPITAL_COMMUNITY): Payer: Self-pay

## 2023-01-31 MED ORDER — TRAZODONE HCL 50 MG PO TABS
25.0000 mg | ORAL_TABLET | Freq: Every evening | ORAL | 3 refills | Status: DC | PRN
  Filled 2023-01-31: qty 30, 30d supply, fill #0
  Filled 2023-02-22 – 2023-02-23 (×2): qty 30, 30d supply, fill #1
  Filled 2023-03-28: qty 30, 30d supply, fill #2
  Filled 2023-05-11: qty 30, 30d supply, fill #3
  Filled 2023-05-11: qty 30, 30d supply, fill #0

## 2023-02-02 ENCOUNTER — Ambulatory Visit (INDEPENDENT_AMBULATORY_CARE_PROVIDER_SITE_OTHER): Payer: BLUE CROSS/BLUE SHIELD | Admitting: Nurse Practitioner

## 2023-02-02 ENCOUNTER — Encounter: Payer: Self-pay | Admitting: Nurse Practitioner

## 2023-02-02 ENCOUNTER — Other Ambulatory Visit (HOSPITAL_COMMUNITY): Payer: Self-pay

## 2023-02-02 VITALS — BP 127/60 | HR 80 | Ht 74.0 in | Wt 174.0 lb

## 2023-02-02 DIAGNOSIS — Z13220 Encounter for screening for lipoid disorders: Secondary | ICD-10-CM

## 2023-02-02 DIAGNOSIS — B372 Candidiasis of skin and nail: Secondary | ICD-10-CM | POA: Diagnosis not present

## 2023-02-02 DIAGNOSIS — K5900 Constipation, unspecified: Secondary | ICD-10-CM | POA: Diagnosis not present

## 2023-02-02 DIAGNOSIS — I63413 Cerebral infarction due to embolism of bilateral middle cerebral arteries: Secondary | ICD-10-CM | POA: Diagnosis not present

## 2023-02-02 MED ORDER — NYSTATIN 100000 UNIT/GM EX CREA
1.0000 | TOPICAL_CREAM | Freq: Two times a day (BID) | CUTANEOUS | 0 refills | Status: DC
  Filled 2023-02-02: qty 30, 15d supply, fill #0

## 2023-02-02 MED ORDER — DOCUSATE SODIUM 100 MG PO CAPS
100.0000 mg | ORAL_CAPSULE | Freq: Two times a day (BID) | ORAL | 0 refills | Status: DC
  Filled 2023-02-02: qty 10, 5d supply, fill #0

## 2023-02-02 NOTE — Progress Notes (Signed)
@Patient  ID: Carlos Mckinney, male    DOB: 1996-05-19, 27 y.o.   MRN: [id number]  Chief Complaint  Patient presents with   Hemorrhoids    Jock itch--last Dx cream is not covered by insurance     Referring provider: Ivonne Andrew, NP   HPI  Patient presents today for follow-up visit.  Overall he has been doing well since his last visit here.  He is due for follow-up visit with cardiology.  He does continue to have yeast dermatitis to his he thinks this may be due to [place].  His mother is going to contact cardiology to see if this medication can be changed.  We will order nystatin cream.  Patient also needs a refill on Colace for constipation.  Denies f/c/s, n/v/d, hemoptysis, PND, leg swelling Denies chest pain or edema       No Known Allergies  Immunization History  Administered Date(s) Administered   DTaP 12/04/1994, 02/12/1995, 04/26/1995   HIB (PRP-OMP) 12/04/1994, 02/12/1995, 04/26/1995   HPV Quadrivalent 06/29/2010   Hepatitis B 1994/11/01, 12/04/1994, 04/26/1995   IPV 12/04/1994, 02/12/1995, 04/26/1995   Influenza-Unspecified 06/29/2010   Janssen (J&J) SARS-COV-2 Vaccination 10/29/2019   MMR 09/27/1995    Past Medical History:  Diagnosis Date   Heart failure (HCC)    Hypertension    Polysubstance abuse (HCC)     Tobacco History: Social History   Tobacco Use  Smoking Status Former   Types: Cigarettes  Smokeless Tobacco Never   Counseling given: Not Answered   Outpatient Encounter Medications as of 02/02/2023  Medication Sig   nystatin cream (MYCOSTATIN) Apply 1 Application topically 2 (two) times daily.   albuterol (VENTOLIN HFA) 108 (90 Base) MCG/ACT inhaler Inhale 1-2 puffs into the lungs every 6 (six) hours as needed for wheezing or shortness of breath.   clotrimazole (LOTRIMIN AF JOCK ITCH) 1 % cream Apply 1 Application topically 2 (two) times daily.   dapagliflozin propanediol (FARXIGA) 10 MG TABS tablet Take 1 tablet (10 mg total) by mouth  daily before breakfast. NEEDS FOLLOW UP APPOINTMENT FOR MORE REFILLS   digoxin (LANOXIN) 0.125 MG tablet Take 1 tablet (0.125 mg total) by mouth daily.   docusate sodium (COLACE) 100 MG capsule Take 1 capsule (100 mg total) by mouth 2 (two) times daily.   furosemide (LASIX) 40 MG tablet Take 1 tablet (40 mg total) by mouth daily.   hydrocortisone (ANUSOL-HC) 2.5 % rectal cream Place 1 Application rectally 2 (two) times daily.   potassium chloride SA (KLOR-CON M) 20 MEQ tablet Take 1 tablet (20 mEq total) by mouth daily. (Patient not taking: Reported on 09/28/2022)   rivaroxaban (XARELTO) 20 MG TABS tablet Take 1 tablet (20 mg total) by mouth daily.   sacubitril-valsartan (ENTRESTO) 49-51 MG Take 1 tablet by mouth 2 (two) times daily.   spironolactone (ALDACTONE) 25 MG tablet Take 1 tablet (25 mg total) by mouth daily.   traZODone (DESYREL) 50 MG tablet Take 1/2-1 tablet (25-50 mg total) by mouth at bedtime as needed for sleep.   [DISCONTINUED] docusate sodium (COLACE) 100 MG capsule Take 1 capsule (100 mg total) by mouth 2 (two) times daily. (Patient not taking: Reported on 11/02/2022)   No facility-administered encounter medications on file as of 02/02/2023.     Review of Systems  Review of Systems     Physical Exam  BP 127/60 (BP Location: Left Arm, Patient Position: Sitting, Cuff Size: Normal)   Pulse 80   Ht 6\' 2"  (1.88 m)  Wt 174 lb (78.9 kg)   SpO2 97%   BMI 22.34 kg/m   Wt Readings from Last 5 Encounters:  02/02/23 174 lb (78.9 kg)  11/02/22 178 lb 12.8 oz (81.1 kg)  09/28/22 174 lb 6.4 oz (79.1 kg)  09/22/22 178 lb 9.6 oz (81 kg)  06/06/22 175 lb 6.4 oz (79.6 kg)     Physical Exam   Lab Results:  CBC    Component Value Date/Time   WBC 4.5 09/22/2022 0908   RBC 4.34 09/22/2022 0908   HGB 13.9 09/22/2022 0908   HCT 42.1 09/22/2022 0908   PLT 208 09/22/2022 0908   MCV 97.0 09/22/2022 0908   MCH 32.0 09/22/2022 0908   MCHC 33.0 09/22/2022 0908   RDW 13.8  09/22/2022 0908   LYMPHSABS 1.2 09/24/2021 0052   MONOABS 0.8 09/24/2021 0052   EOSABS 0.2 09/24/2021 0052   BASOSABS 0.0 09/24/2021 0052    BMET    Component Value Date/Time   NA 135 09/22/2022 0908   K 4.1 09/22/2022 0908   CL 98 09/22/2022 0908   CO2 26 09/22/2022 0908   GLUCOSE 81 09/22/2022 0908   BUN 11 09/22/2022 0908   CREATININE 1.14 09/22/2022 0908   CALCIUM 9.4 09/22/2022 0908   GFRNONAA >60 09/22/2022 0908    BNP    Component Value Date/Time   BNP 307.6 (H) 09/22/2022 0908    ProBNP No results found for: "PROBNP"  Imaging: No results found.   Assessment & Plan:   Constipation - docusate sodium (COLACE) 100 MG capsule; Take 1 capsule (100 mg total) by mouth 2 (two) times daily.  Dispense: 10 capsule; Refill: 0  2. Yeast dermatitis  - nystatin cream (MYCOSTATIN); Apply 1 Application topically 2 (two) times daily.  Dispense: 30 g; Refill: 0  3. Cerebrovascular accident (CVA) due to bilateral embolism of middle cerebral arteries (HCC)  - CBC - Comprehensive metabolic panel - CBC - Comprehensive metabolic panel  4. Lipid screening  - Lipid Panel  Follow up in 6 months     Ivonne Andrew, NP 02/02/2023

## 2023-02-02 NOTE — Patient Instructions (Addendum)
1. Constipation, unspecified constipation type  - docusate sodium (COLACE) 100 MG capsule; Take 1 capsule (100 mg total) by mouth 2 (two) times daily.  Dispense: 10 capsule; Refill: 0  2. Yeast dermatitis  - nystatin cream (MYCOSTATIN); Apply 1 Application topically 2 (two) times daily.  Dispense: 30 g; Refill: 0  3. Cerebrovascular accident (CVA) due to bilateral embolism of middle cerebral arteries (HCC)  - CBC - Comprehensive metabolic panel - CBC - Comprehensive metabolic panel  4. Lipid screening  - Lipid Panel  Follow up in 6 months

## 2023-02-02 NOTE — Assessment & Plan Note (Signed)
-   docusate sodium (COLACE) 100 MG capsule; Take 1 capsule (100 mg total) by mouth 2 (two) times daily.  Dispense: 10 capsule; Refill: 0  2. Yeast dermatitis  - nystatin cream (MYCOSTATIN); Apply 1 Application topically 2 (two) times daily.  Dispense: 30 g; Refill: 0  3. Cerebrovascular accident (CVA) due to bilateral embolism of middle cerebral arteries (HCC)  - CBC - Comprehensive metabolic panel - CBC - Comprehensive metabolic panel  4. Lipid screening  - Lipid Panel  Follow up in 6 months

## 2023-02-03 LAB — CBC
Hematocrit: 29.3 % — ABNORMAL LOW (ref 37.5–51.0)
Hemoglobin: 9.5 g/dL — ABNORMAL LOW (ref 13.0–17.7)
MCH: 26.1 pg — ABNORMAL LOW (ref 26.6–33.0)
MCHC: 32.4 g/dL (ref 31.5–35.7)
MCV: 81 fL (ref 79–97)
Platelets: 446 10*3/uL (ref 150–450)
RBC: 3.64 x10E6/uL — ABNORMAL LOW (ref 4.14–5.80)
RDW: 14.6 % (ref 11.6–15.4)
WBC: 10.3 10*3/uL (ref 3.4–10.8)

## 2023-02-03 LAB — COMPREHENSIVE METABOLIC PANEL
ALT: 19 IU/L (ref 0–44)
AST: 18 IU/L (ref 0–40)
Albumin: 4 g/dL — ABNORMAL LOW (ref 4.3–5.2)
Alkaline Phosphatase: 62 IU/L (ref 44–121)
BUN/Creatinine Ratio: 10 (ref 9–20)
BUN: 12 mg/dL (ref 6–20)
Bilirubin Total: 0.2 mg/dL (ref 0.0–1.2)
CO2: 28 mmol/L (ref 20–29)
Calcium: 9 mg/dL (ref 8.7–10.2)
Chloride: 99 mmol/L (ref 96–106)
Creatinine, Ser: 1.2 mg/dL (ref 0.76–1.27)
Globulin, Total: 2.8 g/dL (ref 1.5–4.5)
Glucose: 92 mg/dL (ref 70–99)
Potassium: 4.6 mmol/L (ref 3.5–5.2)
Sodium: 140 mmol/L (ref 134–144)
Total Protein: 6.8 g/dL (ref 6.0–8.5)
eGFR: 84 mL/min/{1.73_m2} (ref >59)

## 2023-02-03 LAB — LIPID PANEL
Chol/HDL Ratio: 3.2 ratio (ref 0.0–5.0)
Cholesterol, Total: 188 mg/dL (ref 100–199)
HDL: 59 mg/dL (ref >39)
LDL Chol Calc (NIH): 76 mg/dL (ref 0–99)
Triglycerides: 330 mg/dL — ABNORMAL HIGH (ref 0–149)
VLDL Cholesterol Cal: 53 mg/dL — ABNORMAL HIGH (ref 5–40)

## 2023-02-05 ENCOUNTER — Other Ambulatory Visit: Payer: Self-pay | Admitting: Nurse Practitioner

## 2023-02-05 ENCOUNTER — Other Ambulatory Visit (HOSPITAL_COMMUNITY): Payer: Self-pay

## 2023-02-05 DIAGNOSIS — D649 Anemia, unspecified: Secondary | ICD-10-CM

## 2023-02-05 MED ORDER — FERROUS SULFATE 90 (18 FE) MG PO TABS
1.0000 | ORAL_TABLET | Freq: Every day | ORAL | 0 refills | Status: AC
  Filled 2023-02-05: qty 30, fill #0

## 2023-02-22 ENCOUNTER — Other Ambulatory Visit (HOSPITAL_COMMUNITY): Payer: Self-pay

## 2023-02-26 ENCOUNTER — Other Ambulatory Visit (HOSPITAL_COMMUNITY): Payer: Self-pay

## 2023-02-26 ENCOUNTER — Other Ambulatory Visit (HOSPITAL_COMMUNITY): Payer: Self-pay | Admitting: Adult Health

## 2023-02-27 ENCOUNTER — Other Ambulatory Visit (HOSPITAL_COMMUNITY): Payer: Self-pay

## 2023-02-27 MED ORDER — DIGOXIN 125 MCG PO TABS
0.1250 mg | ORAL_TABLET | Freq: Every day | ORAL | 6 refills | Status: DC
  Filled 2023-02-27: qty 30, 30d supply, fill #0
  Filled 2023-03-28: qty 30, 30d supply, fill #1
  Filled 2023-05-11: qty 30, 30d supply, fill #2
  Filled 2023-05-11: qty 30, 30d supply, fill #0
  Filled 2023-06-18 (×2): qty 30, 30d supply, fill #1
  Filled 2023-08-03: qty 30, 30d supply, fill #2
  Filled 2023-09-13: qty 30, 30d supply, fill #3
  Filled 2023-09-17 (×2): qty 30, 30d supply, fill #0
  Filled 2023-10-29 (×3): qty 30, 30d supply, fill #1

## 2023-02-28 ENCOUNTER — Other Ambulatory Visit (HOSPITAL_COMMUNITY): Payer: Self-pay

## 2023-02-28 ENCOUNTER — Telehealth (HOSPITAL_COMMUNITY): Payer: Self-pay

## 2023-02-28 DIAGNOSIS — I5022 Chronic systolic (congestive) heart failure: Secondary | ICD-10-CM

## 2023-02-28 DIAGNOSIS — K625 Hemorrhage of anus and rectum: Secondary | ICD-10-CM

## 2023-02-28 MED ORDER — FUROSEMIDE 40 MG PO TABS
60.0000 mg | ORAL_TABLET | Freq: Every day | ORAL | 3 refills | Status: DC
  Filled 2023-02-28 – 2023-04-11 (×2): qty 45, 30d supply, fill #0
  Filled 2023-05-24: qty 45, 30d supply, fill #1
  Filled 2023-07-02: qty 45, 30d supply, fill #2
  Filled 2023-08-03: qty 45, 30d supply, fill #3

## 2023-02-28 NOTE — Telephone Encounter (Signed)
Received call from patient's mother who states that patient has been having side effects from medication xarelto- reports bleeding hemorrhoids and yeast infection. Advised her that yeast infection is less likely related to xarelto and may be more related to [place]- she states she would like to know what patient should do regarding his medications. Patient denies using medications for hemorrhoids, and states that he is having a medium amount of blood in the toilet during bowel movements. And reports he is taking stool softeners. Will forward to provider for review and recommendations. Patients mom verbalized understanding.

## 2023-02-28 NOTE — Telephone Encounter (Signed)
[place], [place], [place]  You26 minutes ago (4:08 PM)    OK to stop [place]. Increase Lasix to 60 mg daily, repeat BMET in 10-14 days. As far as the rectal bleeding, he really needs to be on an [facility]. It looks like his most recent hgb has dropped several grams. Please refer to GI    Returned call to patients mother- advised her of the above. Medication list updated, medication sent to pharmacy. BMET ordered and scheduled- patient aware of appointment time and date per mother. Referral to GI placed. Advised her to call back with any issues, questions, or concerns. Patients mother verbalized understanding of all instructions.

## 2023-03-08 ENCOUNTER — Other Ambulatory Visit (HOSPITAL_COMMUNITY): Payer: Self-pay

## 2023-03-08 ENCOUNTER — Other Ambulatory Visit: Payer: Self-pay

## 2023-03-09 ENCOUNTER — Other Ambulatory Visit (HOSPITAL_COMMUNITY): Payer: BLUE CROSS/BLUE SHIELD

## 2023-03-09 ENCOUNTER — Other Ambulatory Visit: Payer: Self-pay

## 2023-03-12 ENCOUNTER — Other Ambulatory Visit: Payer: Self-pay

## 2023-03-13 ENCOUNTER — Other Ambulatory Visit (HOSPITAL_COMMUNITY): Payer: Self-pay

## 2023-03-13 NOTE — Progress Notes (Unsigned)
NEUROLOGY CONSULTATION NOTE  Carlos Mckinney MRN: [id number] DOB: 1995/11/20  Referring provider: Arvilla Meres, MD Primary care provider: Angus Seller, NP  Reason for consult:  cerebrovascular accident  Assessment/Plan:   History of bilateral MCA infarcts with right M2 occlusion likely secondary to cardiomyopathy in setting of anticoagulation non-compliance. 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  History supplemented by hospital records and referring provider's notes.   Patient sustained bilateral large MCA infarcts in February 2023.  He was admitted to [facility] where he presented with decreased level of consciousness, global aphasia, left sided facial droop, left sided weakness, left sided sensory neglect, left hemianopsia, gait instability and decreased level of consciousness.  Patient has history of nonischemic cardiomyopathy and recently was hospitalized for bilateral PEs and RLE DVT but may have been noncompliant with his Eliquis.  CT perfusion revealed penumbra in the right anterior MCA territory and left posterior MCA territory.  CTA head and neck revealed right superior M2 MCA occlusion vs severe stenosis in the region of the right anterior MCA territory penumbra.  Underwent IR with TICI2C.  No postprocedural ICH.  MRI of brain confirmed large acute infarct in the right MCA territotry and moderate to large acute infarct in the left temporal lobe.  2D Echo revealed EF less than 20%, global hypokinesis, and severe tricuspid regurgitation.  LDL was 45 and Hgb A1c was 4.5.  UDS was positive for cocaine and THC.   Eliquis was held and he was discharged on ASA.   Since then, he has been switched to Xarelto.  ***  04/27/2022 2D ECHO:  LVEF <20%, global hypokinesis, Grade I diastolic dysfunction, trivial MR, no valve stenosis or atrial level shunt detected.   02/02/2023 LDL 76 11/02/2022 Hgb A1c 4.9  09/20/2021 CT PERFUSION:  Motion limited with RAPID reporting areas of penumbra in the right anterior MCA territory and the left posterior MCA territory. Overall penumbra is estimated to be 17 mL. No core infarct identified. 09/20/2021 CTA HEAD:   Severely limited study due to motion and venous timing. M1 MCAs patent with suspected right superior M2 MCA occlusion or severe stenosis in the region of right anterior MCA territory penumbra described above. 09/20/2021 CTA NECK:  Severely motion limited study. The visualized common carotid arteries and internal carotid arteries appear to be grossly patent. Nondiagnostic evaluation of the vertebral arteries. Also, nondiagnostic evaluation for stenosis. 09/21/2021 MRI BRAIN WO:  1. Status post thrombectomy with large area of restricted diffusion in the right MCA territory, in both the right frontal and temporal lobes, consistent with acute infarcts. 2. Additional moderate to large acute infarct in the left temporal lobe in the left MCA territory.   PAST MEDICAL HISTORY: Past Medical History:  Diagnosis Date   Heart failure (HCC)    Hypertension    Polysubstance abuse (HCC)     PAST SURGICAL HISTORY: Past Surgical History:  Procedure Laterality Date   IR CT HEAD LTD  09/22/2021   IR PERCUTANEOUS ART THROMBECTOMY/INFUSION INTRACRANIAL INC DIAG ANGIO  09/20/2021   RADIOLOGY WITH ANESTHESIA N/A 09/20/2021   Procedure: IR WITH ANESTHESIA;  Surgeon: Radiologist, Medication, MD;  Location: [facility] OR;  Service: Radiology;  Laterality: N/A;  MEDICATIONS: Current Outpatient Medications on File Prior to Visit  Medication Sig Dispense Refill   albuterol (VENTOLIN HFA) 108 (90 Base) MCG/ACT inhaler Inhale 1-2 puffs into the  lungs every 6 (six) hours as needed for wheezing or shortness of breath.     clotrimazole (LOTRIMIN AF JOCK ITCH) 1 % cream Apply 1 Application topically 2 (two) times daily. 30 g 0   digoxin (LANOXIN) 0.125 MG tablet Take 1 tablet (0.125 mg total) by mouth daily. NEEDS FOLLOW UP APPOINTMENT 30 tablet 6   docusate sodium (COLACE) 100 MG capsule Take 1 capsule (100 mg total) by mouth 2 (two) times daily. 10 capsule 0   Ferrous Sulfate 90 (18 Fe) MG TABS Take 1 tablet by mouth daily. 30 tablet 0   furosemide (LASIX) 40 MG tablet Take 1.5 tablets (60 mg total) by mouth daily. 45 tablet 3   hydrocortisone (ANUSOL-HC) 2.5 % rectal cream Place 1 Application rectally 2 (two) times daily. 30 g 0   nystatin cream (MYCOSTATIN) Apply 1 Application topically 2 (two) times daily. 30 g 0   potassium chloride SA (KLOR-CON M) 20 MEQ tablet Take 1 tablet (20 mEq total) by mouth daily. (Patient not taking: Reported on 09/28/2022) 60 tablet 5   rivaroxaban (XARELTO) 20 MG TABS tablet Take 1 tablet (20 mg total) by mouth daily. 30 tablet 11   sacubitril-valsartan (ENTRESTO) 49-51 MG Take 1 tablet by mouth 2 (two) times daily. 180 tablet 3   spironolactone (ALDACTONE) 25 MG tablet Take 1 tablet (25 mg total) by mouth daily. 90 tablet 3   traZODone (DESYREL) 50 MG tablet Take 1/2-1 tablet (25-50 mg total) by mouth at bedtime as needed for sleep. 30 tablet 3   No current facility-administered medications on file prior to visit.    ALLERGIES: No Known Allergies  FAMILY HISTORY: Family History  Problem Relation Age of Onset   Hypertension Mother     Objective:  *** General: No acute distress.  Patient appears well-groomed.   Head:  Normocephalic/atraumatic Eyes:  fundi examined but not visualized Neck: supple, no paraspinal tenderness, full range of motion Back: No paraspinal tenderness Heart: regular rate and rhythm Lungs: Clear to auscultation bilaterally. Vascular: No carotid bruits. Neurological  Exam: Mental status: alert and oriented to person, place, and time, speech fluent and not dysarthric, language intact. Cranial nerves: CN I: not tested CN II: pupils equal, round and reactive to light, visual fields intact CN III, IV, VI:  full range of motion, no nystagmus, no ptosis CN V: facial sensation intact. CN VII: upper and lower face symmetric CN VIII: hearing intact CN IX, X: gag intact, uvula midline CN XI: sternocleidomastoid and trapezius muscles intact CN XII: tongue midline Bulk & Tone: normal, no fasciculations. Motor:  muscle strength 5/5 throughout Sensation:  Pinprick, temperature and vibratory sensation intact. Deep Tendon Reflexes:  2+ throughout,  toes downgoing.   Finger to nose testing:  Without dysmetria.   Heel to shin:  Without dysmetria.   Gait:  Normal station and stride.  Romberg negative.    Thank you for allowing me to take part in the care of this patient.  Shon Millet, DO  CC: ***

## 2023-03-14 ENCOUNTER — Ambulatory Visit (INDEPENDENT_AMBULATORY_CARE_PROVIDER_SITE_OTHER): Payer: BLUE CROSS/BLUE SHIELD | Admitting: Neurology

## 2023-03-14 ENCOUNTER — Encounter: Payer: Self-pay | Admitting: Neurology

## 2023-03-14 VITALS — BP 124/75 | HR 71 | Ht 69.0 in | Wt 173.0 lb

## 2023-03-14 DIAGNOSIS — I63413 Cerebral infarction due to embolism of bilateral middle cerebral arteries: Secondary | ICD-10-CM | POA: Diagnosis not present

## 2023-03-14 DIAGNOSIS — I69320 Aphasia following cerebral infarction: Secondary | ICD-10-CM

## 2023-03-14 DIAGNOSIS — I5023 Acute on chronic systolic (congestive) heart failure: Secondary | ICD-10-CM | POA: Diagnosis not present

## 2023-03-14 DIAGNOSIS — I10 Essential (primary) hypertension: Secondary | ICD-10-CM | POA: Diagnosis not present

## 2023-03-14 NOTE — Patient Instructions (Signed)
Agree with disability At this time, continued treatment focused on secondary stroke prevention:  - Xarelto   - Blood pressure medications  - Cholesterol control  - Blood sugar control  - quit smoking (cigarettes, marijuana)  - no alcohol  - Mediterranean diet    Mediterranean Diet A Mediterranean diet is based on the traditions of countries on the [employer]. It focuses on eating more: Fruits and vegetables. Whole grains, beans, nuts, and seeds. Heart-healthy fats. These are fats that are good for your heart. It involves eating less: Dairy. Meat and eggs. Processed foods with added sugar, salt, and fat. This type of diet can help prevent certain conditions. It can also improve outcomes if you have a long-term (chronic) disease, such as kidney or heart disease. What are tips for following this plan? Reading food labels Check packaged foods for: The serving size. For foods such as rice and pasta, the serving size is the amount of cooked product, not dry. The total fat. Avoid foods with saturated fat or trans fat. Added sugars, such as corn syrup. Shopping  Try to have a balanced diet. Buy a variety of foods, such as: Fresh fruits and vegetables. You may be able to get these from local farmers markets. You can also buy them frozen. Grains, beans, nuts, and seeds. Some of these can be bought in bulk. Fresh seafood. Poultry and eggs. Low-fat dairy products. Buy whole ingredients instead of foods that have already been packaged. If you can't get fresh seafood, buy precooked frozen shrimp or canned fish, such as tuna, salmon, or sardines. Stock your pantry so you always have certain foods on hand, such as olive oil, canned tuna, canned tomatoes, rice, pasta, and beans. Cooking Cook foods with extra-virgin olive oil instead of using butter or other vegetable oils. Have meat as a side dish. Have vegetables or grains as your main dish. This means having meat in small portions or  adding small amounts of meat to foods like pasta or stew. Use beans or vegetables instead of meat in common dishes like chili or lasagna. Try out different cooking methods. Try roasting, broiling, steaming, and sauting vegetables. Add frozen vegetables to soups, stews, pasta, or rice. Add nuts or seeds for added healthy fats and plant protein at each meal. You can add these to yogurt, salads, or vegetable dishes. Marinate fish or vegetables using olive oil, lemon juice, garlic, and fresh herbs. Meal planning Plan to eat a vegetarian meal one day each week. Try to work up to two vegetarian meals, if possible. Eat seafood two or more times a week. Have healthy snacks on hand. These may include: Vegetable sticks with hummus. Greek yogurt. Fruit and nut trail mix. Eat balanced meals. These should include: Fruit: 2-3 servings a day. Vegetables: 4-5 servings a day. Low-fat dairy: 2 servings a day. Fish, poultry, or lean meat: 1 serving a day. Beans and legumes: 2 or more servings a week. Nuts and seeds: 1-2 servings a day. Whole grains: 6-8 servings a day. Extra-virgin olive oil: 3-4 servings a day. Limit red meat and sweets to just a few servings a month. Lifestyle  Try to cook and eat meals with your family. Drink enough fluid to keep your pee (urine) pale yellow. Be active every day. This includes: Aerobic exercise, which is exercise that causes your heart to beat faster. Examples include running and swimming. Leisure activities like gardening, walking, or housework. Get 7-8 hours of sleep each night. Drink red wine if your provider  says you can. A glass of wine is 5 oz (150 mL). You may be allowed to have: Up to 1 glass a day if you're male and not pregnant. Up to 2 glasses a day if you're male. What foods should I eat? Fruits Apples. Apricots. Avocado. Berries. Bananas. Cherries. Dates. Figs. Grapes. Lemons. Melon. Oranges. Peaches. Plums. Pomegranate. Vegetables Artichokes.  Beets. Broccoli. Cabbage. Carrots. Eggplant. Green beans. Chard. Kale. Spinach. Onions. Leeks. Peas. Squash. Tomatoes. Peppers. Radishes. Grains Whole-grain pasta. Brown rice. Bulgur wheat. Polenta. Couscous. Whole-wheat bread. Orpah Cobb. Meats and other proteins Beans. Almonds. Sunflower seeds. Pine nuts. Peanuts. Cod. Salmon. Scallops. Shrimp. Tuna. Tilapia. Clams. Oysters. Eggs. Chicken or [place] without skin. Dairy Low-fat milk. Cheese. Greek yogurt. Fats and oils Extra-virgin olive oil. Avocado oil. Grapeseed oil. Beverages Water. Red wine. Herbal tea. Sweets and desserts Greek yogurt with honey. Baked apples. Poached pears. Trail mix. Seasonings and condiments Basil. Cilantro. Coriander. Cumin. Mint. Parsley. Sage. Rosemary. Tarragon. Garlic. Oregano. Thyme. Pepper. Balsamic vinegar. Tahini. Hummus. Tomato sauce. Olives. Mushrooms. The items listed above may not be all the foods and drinks you can have. Talk to a dietitian to learn more. What foods should I limit? This is a list of foods that should be eaten rarely. Fruits Fruit canned in syrup. Vegetables Deep-fried potatoes, like [place] fries. Grains Packaged pasta or rice dishes. Cereal with added sugar. Snacks with added sugar. Meats and other proteins Beef. Pork. Lamb. Chicken or [place] with skin. Hot dogs. Tomasa Blase. Dairy Ice cream. Sour cream. Whole milk. Fats and oils Butter. Canola oil. Vegetable oil. Beef fat (tallow). Lard. Beverages Juice. Sugar-sweetened soft drinks. Beer. Liquor and spirits. Sweets and desserts Cookies. Cakes. Pies. Candy. Seasonings and condiments Mayonnaise. Pre-made sauces and marinades. The items listed above may not be all the foods and drinks you should limit. Talk to a dietitian to learn more. Where to find more information American Heart Association (AHA): heart.org This information is not intended to replace advice given to you by your health care provider. Make sure you discuss  any questions you have with your health care provider. Document Revised: 10/29/2022 Document Reviewed: 10/29/2022 Elsevier Patient Education  2024 [employer].

## 2023-03-28 ENCOUNTER — Other Ambulatory Visit (HOSPITAL_COMMUNITY): Payer: Self-pay

## 2023-04-11 ENCOUNTER — Other Ambulatory Visit (HOSPITAL_COMMUNITY): Payer: Self-pay

## 2023-04-12 ENCOUNTER — Other Ambulatory Visit (HOSPITAL_COMMUNITY): Payer: Self-pay

## 2023-05-01 NOTE — Progress Notes (Signed)
ADVANCED HF CLINIC NOTE  Primary Care: Ivonne Andrew, NP HF Cardiologist: Dr. Gala Romney  Reason for Visit: Chronic Systolic Heart Failure   HPI: Carlos Mckinney is a 27 y.o.male with hx tobacco use, cocaine abuse, obesity.  He's had occasional ED visits over the years.  No routine care.   ED visit 11/2020 with cough, dyspnea, tachycardia/tachypnea.  Chest x-ray with bilateral mild to moderate perihilar and bibasilar infiltrates.  Some suspicion for COVID, pt prescribed CAP coverage and left AMA before covid testing performed.    He was incarcerated from May - September 2022. Released from prison 04/25/2021.  Hospitalized at [facility] and transferred to [facility] 08/22 for acute systolic HF and was seen by AHF team. Echo 08/22 with EF 15-20%, mildly dilated RV with normal RV function, mild MR, moderate TR, PAP 40-45 mmHg. Coronary angiogram not performed. 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 Reports heavy cocaine use for a period of time before incarcerated.  Diuresed with IV lasix and transitioned to oral Lasix 20 mg daily.  Started on GDMT with enstresto and spiro. Denies any recent drug use, still drinking ETOH occasionally.   Established care 05/02/21, mild volume overload and NYHA II-III. Lasix increased.  Seen in ED 05/24/21 for CP and swelling, left without being seen.  Seen back in clinic 11/22 and was fluid overloaded after running out of meds. Diuretics restarted and close f/u was advised but he no-showed the last 2 appts.   Direct admit from clinic 2/23 for a/c CHF and cardiogenic shock. PICC line placed for CVP and co-ox monitoring. Initial co-ox 24%, started on inotrope support with milrinone. Diuresed > 30  lb with IV lasix. Weaned off milrinone once diuresed. UDS + for cocaine and marijuana. Started on GDMT. Refused R/LHC. cMRI - with severe LVEF 12% and RV 18%. Delayed enhancement images poor due to recent feraheme. Previously stopped Eliquis due to hematochezia (felt to be 2/2  hemorrhoids), restarted. He was adamant to go home despite CoOx 40%. Discussed risks and PICC removed. Discharged home, weight 196 lbs.  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 UDS+ cocaine and THC. Underwent mechanical thrombectomyHe continued with left-sided weakness and aphasia. CIR was recommended but patient refused. He was discharged home on Xarelto + ASA with wife, weight 206 lbs.  At last visit he was here with his girlfriend. He was clearly intoxicated/high. Refused drug testing.   Follow up 11/23, NYHA II, Farxiga restarted. Mental status made it difficult to assess, ? Deficits from stroke vs drug use. Referred to Neuro.  Today he returns for HF follow up with his mother. She helps with most of the history as [name] has difficulty answering questions. Overall feeling fine. He has SOB with physical exertion, does OK walking and with ADLs. He answers "yes" to me asking if he has CP, dizziness and swelling, but his mother says he has not complained of any of this to her. He denies PND/Orthopnea. His main complaint is penile irritation and he is asking for a cream. Appetite ok. No fever or chills. He is not weighing at home, mom helps with his medications. Smokes 2 cigars/day, occasional ETOH, no drugs.   Review of systems complete and found to be negative unless listed in HPI.    Current Outpatient Medications  Medication Sig Dispense Refill   albuterol (VENTOLIN HFA) 108 (90 Base) MCG/ACT inhaler Inhale 1-2 puffs into the lungs every 6 (six) hours as needed for  wheezing or shortness of breath.     clotrimazole (LOTRIMIN AF JOCK ITCH) 1 % cream Apply 1 Application topically 2 (two) times daily. 30 g 0   digoxin (LANOXIN) 0.125 MG tablet Take 1 tablet (0.125 mg total) by mouth daily. NEEDS FOLLOW UP APPOINTMENT 30 tablet 6   docusate sodium (COLACE) 100 MG capsule Take 1 capsule (100 mg total) by mouth 2 (two) times  daily. 10 capsule 0   Ferrous Sulfate 90 (18 Fe) MG TABS Take 1 tablet by mouth daily. 30 tablet 0   furosemide (LASIX) 40 MG tablet Take 1.5 tablets (60 mg total) by mouth daily. 45 tablet 3   hydrocortisone (ANUSOL-HC) 2.5 % rectal cream Place 1 Application rectally 2 (two) times daily. 30 g 0   nystatin cream (MYCOSTATIN) Apply 1 Application topically 2 (two) times daily. 30 g 0   potassium chloride SA (KLOR-CON M) 20 MEQ tablet Take 1 tablet (20 mEq total) by mouth daily. 60 tablet 5   rivaroxaban (XARELTO) 20 MG TABS tablet Take 1 tablet (20 mg total) by mouth daily. 30 tablet 11   sacubitril-valsartan (ENTRESTO) 49-51 MG Take 1 tablet by mouth 2 (two) times daily. 180 tablet 3   spironolactone (ALDACTONE) 25 MG tablet Take 1 tablet (25 mg total) by mouth daily. 90 tablet 3   traZODone (DESYREL) 50 MG tablet Take 1/2-1 tablet (25-50 mg total) by mouth at bedtime as needed for sleep. 30 tablet 3   No current facility-administered medications for this visit.   No Known Allergies  Social History   Socioeconomic History   Marital status: Married    Spouse name: Not on file   Number of children: Not on file   Years of education: Not on file   Highest education level: Not on file  Occupational History   Not on file  Tobacco Use   Smoking status: Some Days    Types: Cigarettes   Smokeless tobacco: Never  Substance and Sexual Activity   Alcohol use: No   Drug use: No   Sexual activity: Not on file  Other Topics Concern   Not on file  Social History Narrative   Left handed   Social Determinants of Health   Financial Resource Strain: High Risk (10/03/2021)   Overall Financial Resource Strain (CARDIA)    Difficulty of Paying Living Expenses: Hard  Food Insecurity: No Food Insecurity (10/03/2021)   Hunger Vital Sign    Worried About Running Out of Food in the Last Year: Never true    Ran Out of Food in the Last Year: Never true  Transportation Needs: No Transportation Needs  (10/03/2021)   PRAPARE - Administrator, Civil Service (Medical): No    Lack of Transportation (Non-Medical): No  Physical Activity: Not on file  Stress: Not on file  Social Connections: Not on file  Intimate Partner Violence: Not on file   FH: His maternal cousin has systolic HF and father has heart issues but details not known.   SH: Homeless. Staying with grandmother presently. Unable to work, applying for disability.  There were no vitals taken for this visit.  Wt Readings from Last 3 Encounters:  03/14/23 78.5 kg (173 lb)  02/02/23 78.9 kg (174 lb)  11/02/22 81.1 kg (178 lb 12.8 oz)   PHYSICAL EXAM: General:  NAD. No resp difficulty, walked into clinic,  HEENT: Normal Neck: Supple. No JVD. Carotids 2+ bilat; no bruits. No lymphadenopathy or thryomegaly appreciated. Cor: PMI nondisplaced. Regular rate &  rhythm. No rubs, gallops or murmurs. Lungs: Clear, diminished in bases. Abdomen: Soft, nontender, nondistended. No hepatosplenomegaly. No bruits or masses. Good bowel sounds. Extremities: No cyanosis, clubbing, rash, edema Neuro: Alert & oriented x 3, cranial nerves grossly intact. Moves all 4 extremities w/o difficulty. Affect pleasant. Difficulty following commands, appears to have receptive/expressive aphasia.   ASSESSMENT & PLAN: 1. 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 Admit for a/c HF 08/22.  - Echo (8/22): EF 15-20%, mildly dilated RV with normal RV function, mild MR, moderate TR, PAP 40-45 mmHg - Echo 08/07/21 EF < 20%.  - Declined R/LHC, states he may consider another time - cMRI 2/23 EF 12% RVEF 18% minimal LGE. Moderate left pleural effusion  - Echo  04/27/22: EF 15% RV normal. G1DD - NYHA Class II but difficult to assess with aphasia. Volume OK today. - Continue Entresto 49/51 mg bid. - Continue Lasix 40 mg daily. Start taking 20 KCL daily. - Continue digoxin 0.125 mg daily. - Continue spiro 25 mg daily. -  Continue Farxiga 10 mg daily. ? Penile irritation, genitalia looked OK on exam. - Not on b-blocker with cocaine use and low output  - Not a candidate for advanced therapies/ICD with mental status and possible ongoing polysubstance use - BMET and dig level today.  2. 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 - Continue Xarelto, no longer on ASA - Unclear if deficits related to CVA or drug use - He has neuro follow up 09/2022  3. H/o Snoring - Refused sleep study - No changes   4. Substance Use - Cocaine/ETOH and tobacco use history  - UDS + cocaine and THC last admit 2/23 - Denies any further cocaine use, still smoking  - Imperative he refrains from further use   5. PE/ RLE DVT - Diagnosed 08/06/21, placed on Eliquis. - Continue Xarelto  - CBC today  6. Penile irritation - Difficult to illicit more history from him. Denies penile discharge. No CVA tenderness, fever or dysuria - External genitalia unremarkable on exam. - He is sexually active, uses condoms "sometimes" - I am unable to obtain u/a today, advised PCP follow up soon to work up. - In mean time, can try OTC lotrimin if irritation is related to yeast. Discussed potential side effects related to [place].  Follow up in 3-4 months with Dr. Gala Romney.   Anderson Malta Ivy, FNP-BC 05/01/23

## 2023-05-02 ENCOUNTER — Ambulatory Visit (HOSPITAL_COMMUNITY)
Admission: RE | Admit: 2023-05-02 | Discharge: 2023-05-02 | Disposition: A | Discharge status: Home / Self Care | Payer: BLUE CROSS/BLUE SHIELD | Source: Ambulatory Visit | Attending: Family Medicine | Admitting: Family Medicine

## 2023-05-02 ENCOUNTER — Encounter (HOSPITAL_COMMUNITY): Payer: Self-pay

## 2023-05-02 VITALS — BP 128/70 | HR 50 | Wt 170.0 lb

## 2023-05-02 DIAGNOSIS — Z5900 Homelessness unspecified: Secondary | ICD-10-CM | POA: Insufficient documentation

## 2023-05-02 DIAGNOSIS — I639 Cerebral infarction, unspecified: Secondary | ICD-10-CM | POA: Diagnosis not present

## 2023-05-02 DIAGNOSIS — R0683 Snoring: Secondary | ICD-10-CM | POA: Diagnosis not present

## 2023-05-02 DIAGNOSIS — F17210 Nicotine dependence, cigarettes, uncomplicated: Secondary | ICD-10-CM | POA: Diagnosis not present

## 2023-05-02 DIAGNOSIS — I5022 Chronic systolic (congestive) heart failure: Secondary | ICD-10-CM | POA: Diagnosis not present

## 2023-05-02 DIAGNOSIS — Z86711 Personal history of pulmonary embolism: Secondary | ICD-10-CM | POA: Insufficient documentation

## 2023-05-02 DIAGNOSIS — Z8673 Personal history of transient ischemic attack (TIA), and cerebral infarction without residual deficits: Secondary | ICD-10-CM | POA: Insufficient documentation

## 2023-05-02 DIAGNOSIS — Z7901 Long term (current) use of anticoagulants: Secondary | ICD-10-CM | POA: Diagnosis not present

## 2023-05-02 DIAGNOSIS — I428 Other cardiomyopathies: Secondary | ICD-10-CM | POA: Insufficient documentation

## 2023-05-02 DIAGNOSIS — Z86718 Personal history of other venous thrombosis and embolism: Secondary | ICD-10-CM | POA: Diagnosis not present

## 2023-05-02 DIAGNOSIS — Z79899 Other long term (current) drug therapy: Secondary | ICD-10-CM | POA: Insufficient documentation

## 2023-05-02 DIAGNOSIS — Z720 Tobacco use: Secondary | ICD-10-CM

## 2023-05-02 DIAGNOSIS — F1410 Cocaine abuse, uncomplicated: Secondary | ICD-10-CM | POA: Diagnosis not present

## 2023-05-02 LAB — BASIC METABOLIC PANEL
Anion gap: 9 (ref 5–15)
BUN: 16 mg/dL (ref 6–20)
CO2: 27 mmol/L (ref 22–32)
Calcium: 9.9 mg/dL (ref 8.9–10.3)
Chloride: 101 mmol/L (ref 98–111)
Creatinine, Ser: 1.33 mg/dL — ABNORMAL HIGH (ref 0.61–1.24)
GFR, Estimated: >60 mL/min (ref >60)
Glucose, Bld: 98 mg/dL (ref 70–99)
Potassium: 4.2 mmol/L (ref 3.5–5.1)
Sodium: 137 mmol/L (ref 135–145)

## 2023-05-02 LAB — BRAIN NATRIURETIC PEPTIDE: B Natriuretic Peptide: 93.9 pg/mL (ref 0.0–100.0)

## 2023-05-02 LAB — DIGOXIN LEVEL: Digoxin Level: 1.4 ng/mL (ref 0.8–2.0)

## 2023-05-02 NOTE — Addendum Note (Signed)
Encounter addended by: Baird Cancer, RN on: 05/02/2023 10:13 AM  Actions taken: Charge Capture section accepted

## 2023-05-02 NOTE — Patient Instructions (Signed)
Medication Changes:  No Changes In Medications at this time.   Lab Work:  Labs done today, your results will be available in MyChart, we will contact you for abnormal readings.  Testing/Procedures:  Your physician has requested that you have an echocardiogram soon as scheduled . Echocardiography is a painless test that uses sound waves to create images of your heart. It provides your doctor with information about the size and shape of your heart and how well your heart's chambers and valves are working. This procedure takes approximately one hour. There are no restrictions for this procedure. Please do NOT wear cologne, perfume, aftershave, or lotions (deodorant is allowed). Please arrive 15 minutes prior to your appointment time.  Follow-Up in: 4 MONTHS WITH DR. Gala Romney. PLEASE CALL OUR OFFICE AROUND NOVEMBER TO GET SCHEDULED FOR YOUR APPOINTMENT. PHONE NUMBER IS [phone] OPTION 2   At the [facility], you and your health needs are our priority. We have a designated team specialized in the treatment of Heart Failure. This Care Team includes your primary Heart Failure Specialized Cardiologist (physician), Advanced Practice Providers (APPs- Physician Assistants and Nurse Practitioners), and Pharmacist who all work together to provide you with the care you need, when you need it.   You may see any of the following providers on your designated Care Team at your next follow up:  Dr. Arvilla Meres Dr. Marca Ancona Dr. Dorthula Nettles Dr. Theresia Bough Tonye Becket, NP Robbie Lis, [place] [facility] [place], [place] Brynda Peon, NP [place] Lee, NP Karle Plumber, PharmD   Please be sure to bring in all your medications bottles to every appointment.   Need to Contact us:  If you have any questions or concerns before your next appointment please send [place] a message through [place] or call our office at [phone].    TO LEAVE A MESSAGE FOR THE NURSE SELECT  OPTION 2, PLEASE LEAVE A MESSAGE INCLUDING: YOUR NAME DATE OF BIRTH CALL BACK NUMBER REASON FOR CALL**this is important as we prioritize the call backs  YOU WILL RECEIVE A CALL BACK THE SAME DAY AS LONG AS YOU CALL BEFORE 4:00 PM

## 2023-05-03 ENCOUNTER — Telehealth (HOSPITAL_COMMUNITY): Payer: Self-pay

## 2023-05-03 DIAGNOSIS — I5022 Chronic systolic (congestive) heart failure: Secondary | ICD-10-CM

## 2023-05-03 NOTE — Telephone Encounter (Signed)
Patient's Digoxin lab order has been placed and his lab appointment scheduled. Pt's mother was informed to hold patient's digoxin medication the night before and/or the morning of. Patient's mom Melba is agreeable, and verbalized understanding.

## 2023-05-03 NOTE — Telephone Encounter (Signed)
-----   Message from Jacklynn Ganong sent at 05/02/2023 10:54 AM EDT ----- Labs ok but digoxin level elevated. I do not think this was a digoxin trough.   Please bring back this week for a dig trough (do  NOT take digoxin before lab drawn)

## 2023-05-04 ENCOUNTER — Ambulatory Visit (HOSPITAL_COMMUNITY)
Admission: RE | Admit: 2023-05-04 | Discharge: 2023-05-04 | Disposition: A | Discharge status: Home / Self Care | Payer: BLUE CROSS/BLUE SHIELD | Source: Ambulatory Visit | Attending: Internal Medicine | Admitting: Internal Medicine

## 2023-05-04 DIAGNOSIS — I5022 Chronic systolic (congestive) heart failure: Secondary | ICD-10-CM | POA: Diagnosis present

## 2023-05-04 LAB — DIGOXIN LEVEL: Digoxin Level: 0.3 ng/mL — ABNORMAL LOW (ref 0.8–2.0)

## 2023-05-11 ENCOUNTER — Other Ambulatory Visit (HOSPITAL_COMMUNITY): Payer: Self-pay

## 2023-05-15 ENCOUNTER — Other Ambulatory Visit (HOSPITAL_COMMUNITY): Payer: Self-pay

## 2023-05-24 ENCOUNTER — Other Ambulatory Visit (HOSPITAL_COMMUNITY): Payer: Self-pay

## 2023-05-28 ENCOUNTER — Telehealth (HOSPITAL_COMMUNITY): Payer: Self-pay | Admitting: Surgery

## 2023-05-28 NOTE — Telephone Encounter (Signed)
Patient called and mother answered.  I discussed with her that the patient is scheduled on 10/30 for an ECHOCARDIOGRAM COMPLETE.  Patient has BCBS of [place] Non-Participating as his primary insurance.  This insurance plans will not cover the exam to be completed at [facility].      The total charges that will need to be paid up front for this patient, if exam is going to be completed as [facility] will be: $2315.00.  Patient's mother made aware of the above information and she decided to cancel the procedure for now and will reschedule if it is approved at a later date.

## 2023-05-30 ENCOUNTER — Telehealth: Payer: Self-pay | Admitting: Gastroenterology

## 2023-05-30 ENCOUNTER — Ambulatory Visit (HOSPITAL_COMMUNITY): Payer: BLUE CROSS/BLUE SHIELD

## 2023-05-30 ENCOUNTER — Ambulatory Visit: Payer: BLUE CROSS/BLUE SHIELD | Admitting: Gastroenterology

## 2023-05-30 NOTE — Telephone Encounter (Signed)
PT mother called and cancelled appointment for today, no reason as to why

## 2023-06-08 ENCOUNTER — Other Ambulatory Visit: Payer: Self-pay | Admitting: Nurse Practitioner

## 2023-06-08 ENCOUNTER — Ambulatory Visit (HOSPITAL_COMMUNITY)
Admission: EM | Admit: 2023-06-08 | Discharge: 2023-06-08 | Disposition: A | Discharge status: Home / Self Care | Payer: BLUE CROSS/BLUE SHIELD | Attending: Emergency Medicine | Admitting: Emergency Medicine

## 2023-06-08 ENCOUNTER — Other Ambulatory Visit (HOSPITAL_COMMUNITY): Payer: Self-pay

## 2023-06-08 ENCOUNTER — Encounter (HOSPITAL_COMMUNITY): Payer: Self-pay

## 2023-06-08 DIAGNOSIS — K0889 Other specified disorders of teeth and supporting structures: Secondary | ICD-10-CM | POA: Diagnosis not present

## 2023-06-08 MED ORDER — KETOROLAC TROMETHAMINE 60 MG/2ML IM SOLN: 30.0000 mg | Freq: Once | INTRAMUSCULAR | Status: DC

## 2023-06-08 MED ORDER — IBUPROFEN 800 MG PO TABS
800.0000 mg | ORAL_TABLET | Freq: Three times a day (TID) | ORAL | 0 refills | Status: DC
Start: 2023-06-08 — End: 2024-05-15
  Filled 2023-06-08: qty 21, 7d supply, fill #0

## 2023-06-08 MED ORDER — AMOXICILLIN-POT CLAVULANATE 875-125 MG PO TABS
1.0000 | ORAL_TABLET | Freq: Two times a day (BID) | ORAL | 0 refills | Status: DC
  Filled 2023-06-08: qty 14, 7d supply, fill #0

## 2023-06-08 MED ORDER — TRAZODONE HCL 50 MG PO TABS
25.0000 mg | ORAL_TABLET | Freq: Every evening | ORAL | 3 refills | Status: DC | PRN
  Filled 2023-06-08: qty 30, 30d supply, fill #0
  Filled 2023-08-03: qty 30, 30d supply, fill #1

## 2023-06-08 NOTE — Discharge Instructions (Addendum)
Take the antibiotics twice daily with food.  Starting tomorrow you can take 800 mg of ibuprofen and alternate with 500 mg of Tylenol every 4-6 hours for pain and inflammation.  It is important that you follow-up with a dentist to treat the root cause of this issue, as this infection may keep reoccurring if not treated by a dentist.  Return to clinic for any new or urgent symptoms.  [facility] [place] Address: [address] [place], [place], [place] [address] Phone: [phone]  [facility] [phone] extension [phone] [address]  Dr. Lawrence Marseilles [phone] [address]  [place] [phone] [address] [facility] [place].  [facility] [phone] extension [phone], [place], [place], [address] First come first serve for the first 10 clients.  May do simple extractions only, no wisdom teeth or surgery.  You may try the second for Thursday of the month starting at 6:30 AM.  [facility] [employer] [facility] You may call the school to see if they are still helping to provide dental care for emergent cases.

## 2023-06-08 NOTE — ED Triage Notes (Signed)
Pt presents to the office for mouth and tooth pain x 2 days.

## 2023-06-08 NOTE — ED Provider Notes (Signed)
[facility]    CSN: [id number] Arrival date & time: 06/08/23  1417      History   Chief Complaint Chief Complaint  Patient presents with   Dental Pain    HPI Carlos Mckinney is a 27 y.o. male.   Patient presents to clinic with left lower dental pain for the past two days. He has not tried anything for pain. He has a broken back tooth on his left lower jaw.   Denies fevers. Reports sensitivity when drinking cold fluids. Able to eat and drink.   The history is provided by the patient and medical records.  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   CVA (cerebral vascular accident) (HCC) 09/20/2021   Acute on chronic diastolic congestive heart failure, NYHA class 3 (HCC) 09/01/2021   Iron deficiency anemia 08/07/2021   Multiple pulmonary emboli (HCC) 08/06/2021   Acute on chronic HFrEF (heart failure with reduced ejection fraction) (HCC) 08/06/2021   Acute deep vein thrombosis (DVT) of right lower extremity (HCC) 08/06/2021   Anemia 08/06/2021    Past Surgical History:  Procedure Laterality Date   IR CT HEAD LTD  09/22/2021   IR PERCUTANEOUS ART THROMBECTOMY/INFUSION INTRACRANIAL INC DIAG ANGIO  09/20/2021   RADIOLOGY WITH ANESTHESIA N/A 09/20/2021   Procedure: IR WITH ANESTHESIA;  Surgeon: Radiologist, Medication, MD;  Location: [facility];  Service: Radiology;  Laterality: N/A;       Home Medications    Prior to Admission medications   Medication Sig Start Date End Date Taking? Authorizing Provider  amoxicillin-clavulanate (AUGMENTIN) 875-125 MG tablet Take 1 tablet by mouth every 12 (twelve) hours. 06/08/23  Yes Rinaldo Ratel, [place] N, FNP  ibuprofen (ADVIL) 800 MG tablet Take  1 tablet (800 mg total) by mouth 3 (three) times daily. 06/08/23  Yes Rinaldo Ratel, [place] N, FNP  albuterol (VENTOLIN HFA) 108 (90 Base) MCG/ACT inhaler Inhale 1-2 puffs into the lungs every 6 (six) hours as needed for wheezing or shortness of breath.    [provider]  clotrimazole (LOTRIMIN AF JOCK ITCH) 1 % cream Apply 1 Application topically 2 (two) times daily. 11/02/22   Ivonne Andrew, NP  digoxin (LANOXIN) 0.125 MG tablet Take 1 tablet (0.125 mg total) by mouth daily. NEEDS FOLLOW UP APPOINTMENT 02/27/23   Clegg, Amy D, NP  docusate sodium (COLACE) 100 MG capsule Take 1 capsule (100 mg total) by mouth 2 (two) times daily. 02/02/23   Ivonne Andrew, NP  Ferrous Sulfate 90 (18 Fe) MG TABS Take 1 tablet by mouth daily. 02/05/23   Ivonne Andrew, NP  furosemide (LASIX) 40 MG tablet Take 1.5 tablets (60 mg total) by mouth daily. 02/28/23   Milford, Anderson Malta, FNP  hydrocortisone (ANUSOL-HC) 2.5 % rectal cream Place 1 Application rectally 2 (two) times daily. 09/28/22   Ivonne Andrew, NP  nystatin cream (MYCOSTATIN) Apply 1 Application topically 2 (two) times daily. 02/02/23   Ivonne Andrew, NP  rivaroxaban (XARELTO) 20 MG TABS tablet Take 1 tablet (20 mg total) by mouth daily. 11/15/22   Bensimhon, Bevelyn Buckles, MD  sacubitril-valsartan (ENTRESTO) 49-51 MG Take 1 tablet by mouth 2 (two) times daily. 07/20/22  Bensimhon, Bevelyn Buckles, MD  spironolactone (ALDACTONE) 25 MG tablet Take 1 tablet (25 mg total) by mouth daily. 07/21/22   Bensimhon, Bevelyn Buckles, MD  traZODone (DESYREL) 50 MG tablet Take 1/2-1 tablet (25-50 mg total) by mouth at bedtime as needed for sleep. 06/08/23   Ivonne Andrew, NP    Family History Family History  Problem Relation Age of Onset   Hypertension Mother     Social History Social History   Tobacco Use   Smoking status: Some Days    Types: Cigarettes   Smokeless tobacco: Never  Substance Use Topics   Alcohol use: No   Drug use: No     Allergies   Patient  has no known allergies.   Review of Systems Review of Systems   Physical Exam Triage Vital Signs ED Triage Vitals [06/08/23 1639]  Encounter Vitals Group     BP (!) 132/53     Systolic BP Percentile      Diastolic BP Percentile      Pulse Rate 74     Resp 16     Temp 98.3 F (36.8 C)     Temp Source Oral     SpO2 98 %     Weight      Height      Head Circumference      Peak Flow      Pain Score      Pain Loc      Pain Education      Exclude from Growth Chart    No data found.  Updated Vital Signs BP (!) 132/53 (BP Location: Left Arm)   Pulse 74   Temp 98.3 F (36.8 C) (Oral)   Resp 16   SpO2 98%   Visual Acuity Right Eye Distance:   Left Eye Distance:   Bilateral Distance:    Right Eye Near:   Left Eye Near:    Bilateral Near:     Physical Exam Vitals and nursing note reviewed.  Constitutional:      Appearance: Normal appearance.  HENT:     Head: Normocephalic and atraumatic.     Right Ear: External ear normal.     Left Ear: External ear normal.     Nose: Nose normal.     Mouth/Throat:     Mouth: Mucous membranes are moist.     Dentition: Abnormal dentition.      Comments: Dental decay to let posterior molar. Area is tender. No obvious abscess.  Eyes:     Conjunctiva/sclera: Conjunctivae normal.  Cardiovascular:     Rate and Rhythm: Normal rate.  Pulmonary:     Effort: Pulmonary effort is normal. No respiratory distress.  Skin:    General: Skin is warm.  Neurological:     General: No focal deficit present.     Mental Status: He is alert.  Psychiatric:        Mood and Affect: Affect is blunt and flat.        Behavior: Behavior is cooperative.      UC Treatments / Results  Labs (all labs ordered are listed, but only abnormal results are displayed) Labs Reviewed - No data to display  EKG   Radiology No results found.  Procedures Procedures (including critical care time)  Medications Ordered in UC Medications - No data to  display   Initial Impression / Assessment and Plan / UC Course  I have reviewed the triage vital signs and the nursing notes.  Pertinent labs &  imaging results that were available during my care of the patient were reviewed by me and considered in my medical decision making (see chart for details).  Vitals and triage reviewed, patient is hemodynamically stable. Dental decay and tenderness to left posterior molar. Will cover with Augmentin for dental infection. Discussed NSAIDs. Offered IM Toradol in clinic, patient left prior to receiving injection.  No obvious oral swelling or abscess.  Provided with dental resources.  Plan of care, follow-up care return precautions given, no questions at this time.     Final Clinical Impressions(s) / UC Diagnoses   Final diagnoses:  Pain, dental     Discharge Instructions      Take the antibiotics twice daily with food.  Starting tomorrow you can take 800 mg of ibuprofen and alternate with 500 mg of Tylenol every 4-6 hours for pain and inflammation.  It is important that you follow-up with a dentist to treat the root cause of this issue, as this infection may keep reoccurring if not treated by a dentist.  Return to clinic for any new or urgent symptoms.  [facility] [place] Address: [address] [place], [place], [place] [address] Phone: [phone]  [facility] [phone] extension [phone] [address]  Dr. Lawrence Marseilles [phone] [address]  [place] [phone] [address] [facility] [place].  [facility] [phone] extension [phone], [place], [place], [address] First come first serve for the first 10 clients.  May do simple extractions only, no wisdom teeth or surgery.  You may try the second for Thursday of the month starting at 6:30 AM.  [facility] You may call the school to see if they are still helping to provide dental care for emergent  cases.     ED Prescriptions     Medication Sig Dispense Auth. Provider   amoxicillin-clavulanate (AUGMENTIN) 875-125 MG tablet Take 1 tablet by mouth every 12 (twelve) hours. 14 tablet Rinaldo Ratel, [place] N, [place]   ibuprofen (ADVIL) 800 MG tablet Take 1 tablet (800 mg total) by mouth 3 (three) times daily. 21 tablet Kinney Sackmann, [place] N, [place]      PDMP not reviewed this encounter.   Jarel Cuadra, [place] N, [place] 06/08/23 1723

## 2023-06-08 NOTE — Telephone Encounter (Signed)
Please advise [facility] 

## 2023-06-11 ENCOUNTER — Other Ambulatory Visit (HOSPITAL_COMMUNITY): Payer: Self-pay

## 2023-06-18 ENCOUNTER — Other Ambulatory Visit (HOSPITAL_COMMUNITY): Payer: Self-pay

## 2023-06-19 ENCOUNTER — Other Ambulatory Visit: Payer: Self-pay

## 2023-07-02 ENCOUNTER — Other Ambulatory Visit (HOSPITAL_COMMUNITY): Payer: Self-pay

## 2023-08-03 ENCOUNTER — Ambulatory Visit: Payer: Self-pay | Admitting: Nurse Practitioner

## 2023-08-03 ENCOUNTER — Other Ambulatory Visit (HOSPITAL_COMMUNITY): Payer: Self-pay

## 2023-08-13 IMAGING — MR MR CARD MORPHOLOGY WO/W CM
44 of 48 series · 44 of 48 positions shown · IV contrast (gadavist)
Comparison: none

CLINICAL DATA: Cardiomyopathy of uncertain etiology

EXAM:
CARDIAC MRI
TECHNIQUE: The patient was scanned on a 1.5 Tesla GE magnet. A dedicated
cardiac coil was used. Functional imaging was done using Fiesta
sequences. [DATE], and 4 chamber views were done to assess for RWMA's.
Modified Imo Casas Comissioner rule using a short axis stack was used to
calculate an ejection fraction on a dedicated work station using
Circle software. The patient received 10 cc of Gadavist. After 10
minutes inversion recovery sequences were used to assess for
infiltration and scar tissue.
CONTRAST:  Gadavist 10 cc

[Series 4: [id number] · axial · 8.0mm · 1.41mm/px · 1 of 16 slices shown]
[im 1/16]
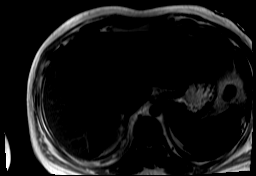

[Series 9: bSSFP · oblique · 8.0mm · 1.61mm/px · 1 of 25 slices shown (1 of 24)]
[im 1/25]
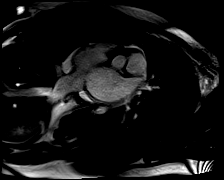

[Series 10: bSSFP · oblique · 8.0mm · 1.61mm/px · 1 of 25 slices shown (2 of 24)]
[im 1/25]
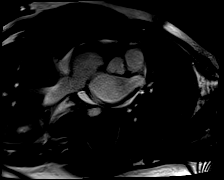

[Series 11: bSSFP · oblique · 8.0mm · 1.61mm/px · 1 of 25 slices shown (3 of 24)]
[im 1/25]
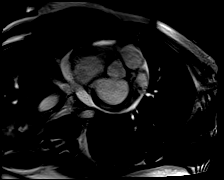

[Series 12: bSSFP · oblique · 8.0mm · 1.61mm/px · 1 of 25 slices shown (4 of 24)]
[im 1/25]
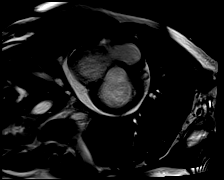

[Series 13: bSSFP · oblique · 8.0mm · 1.61mm/px · 1 of 25 slices shown (5 of 24)]
[im 1/25]
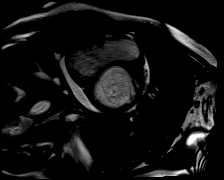

[Series 14: bSSFP · oblique · 8.0mm · 1.61mm/px · 1 of 25 slices shown (6 of 24)]
[im 1/25]
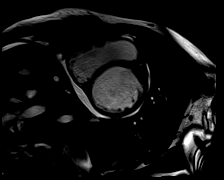

[Series 15: bSSFP · oblique · 8.0mm · 1.61mm/px · 1 of 25 slices shown (7 of 24)]
[im 1/25]
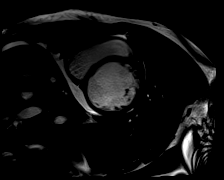

[Series 16: bSSFP · oblique · 8.0mm · 1.61mm/px · 1 of 25 slices shown (8 of 24)]
[im 1/25]
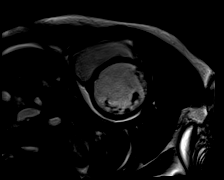

[Series 17: bSSFP · oblique · 8.0mm · 1.61mm/px · 1 of 25 slices shown (9 of 24)]
[im 1/25]
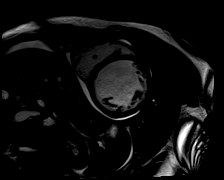

[Series 18: bSSFP · oblique · 8.0mm · 1.61mm/px · 1 of 25 slices shown (10 of 24)]
[im 1/25]
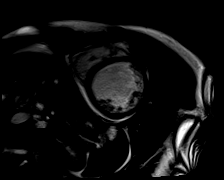

[Series 19: bSSFP · oblique · 8.0mm · 1.61mm/px · 1 of 25 slices shown (11 of 24)]
[im 1/25]
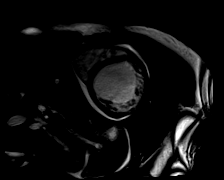

[Series 20: bSSFP · oblique · 8.0mm · 1.61mm/px · 1 of 25 slices shown (12 of 24)]
[im 1/25]
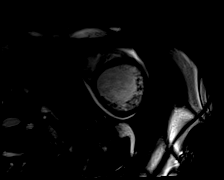

[Series 21: bSSFP · oblique · 8.0mm · 1.61mm/px · 1 of 25 slices shown (13 of 24)]
[im 1/25]
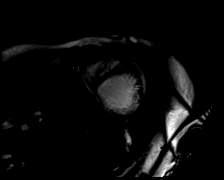

[Series 22: bSSFP · oblique · 8.0mm · 1.61mm/px · 1 of 25 slices shown (14 of 24)]
[im 1/25]
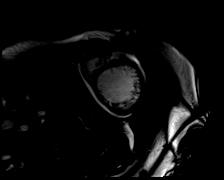

[Series 23: bSSFP · oblique · 8.0mm · 1.61mm/px · 1 of 25 slices shown (15 of 24)]
[im 1/25]
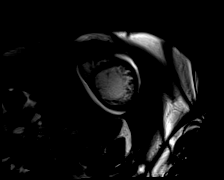

[Series 24: bSSFP · oblique · 8.0mm · 1.61mm/px · 1 of 25 slices shown (16 of 24)]
[im 1/25]
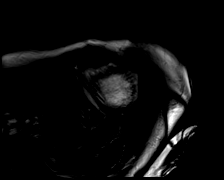

[Series 25: bSSFP · oblique · 8.0mm · 1.61mm/px · 1 of 25 slices shown (17 of 24)]
[im 1/25]
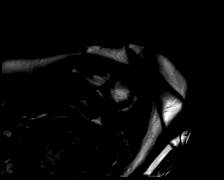

[Series 26: bSSFP · oblique · 8.0mm · 1.61mm/px · 1 of 25 slices shown (18 of 24)]
[im 1/25]
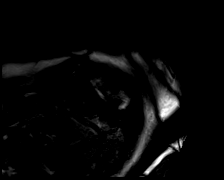

[Series 27: bSSFP · oblique · 8.0mm · 1.61mm/px · 1 of 25 slices shown (19 of 24)]
[im 1/25]
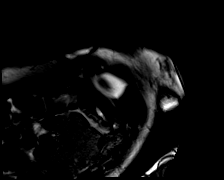

[Series 28: bSSFP · oblique · 8.0mm · 1.61mm/px · 1 of 25 slices shown (20 of 24)]
[im 1/25]
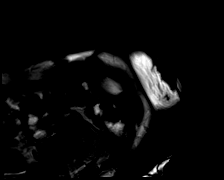

[Series 29: bSSFP · oblique · 8.0mm · 1.61mm/px · 1 of 25 slices shown (21 of 24)]
[im 1/25]
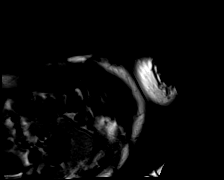

[Series 30: bSSFP · sagittal · 6.0mm · 1.41mm/px · 1 of 25 slices shown (22 of 24)]
[im 1/25]
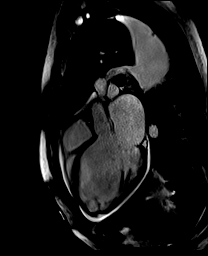

[Series 31: bSSFP · oblique · 6.0mm · 1.41mm/px · 1 of 25 slices shown (23 of 24)]
[im 1/25]
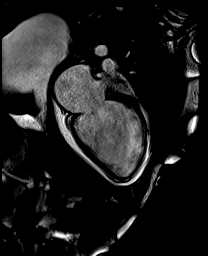

[Series 32: bSSFP · oblique · 6.0mm · 1.41mm/px · 1 of 25 slices shown (24 of 24)]
[im 1/25]
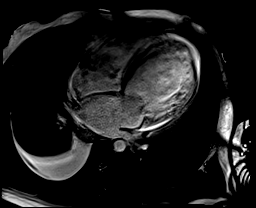

[Series 33: cine_trufi_cs_rt_short axis · oblique · 8.0mm · 1.73mm/px · 1 of 49 slices shown (1 of 19)]
[im 1/49]
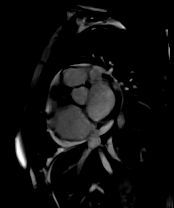

[Series 33: cine_trufi_cs_rt_short axis · oblique · 8.0mm · 1.73mm/px · 1 of 49 slices shown (2 of 19)]
[im 1/49]
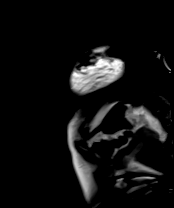

[Series 33: cine_trufi_cs_rt_short axis · oblique · 8.0mm · 1.73mm/px · 1 of 49 slices shown (3 of 19)]
[im 1/49]
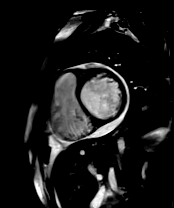

[Series 33: cine_trufi_cs_rt_short axis · oblique · 8.0mm · 1.73mm/px · 1 of 49 slices shown (4 of 19)]
[im 1/49]
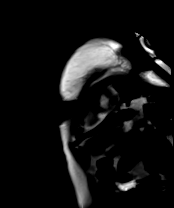

[Series 33: cine_trufi_cs_rt_short axis · oblique · 8.0mm · 1.73mm/px · 1 of 49 slices shown (5 of 19)]
[im 1/49]
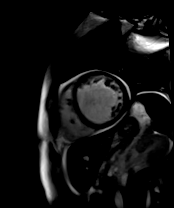

[Series 33: cine_trufi_cs_rt_short axis · oblique · 8.0mm · 1.73mm/px · 1 of 49 slices shown (6 of 19)]
[im 1/49]
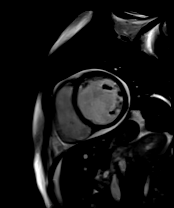

[Series 33: cine_trufi_cs_rt_short axis · oblique · 8.0mm · 1.73mm/px · 1 of 49 slices shown (7 of 19)]
[im 1/49]
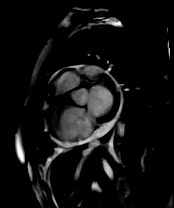

[Series 33: cine_trufi_cs_rt_short axis · oblique · 8.0mm · 1.73mm/px · 1 of 49 slices shown (8 of 19)]
[im 1/49]
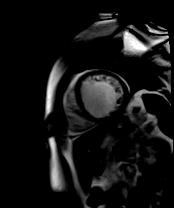

[Series 33: cine_trufi_cs_rt_short axis · oblique · 8.0mm · 1.73mm/px · 1 of 49 slices shown (9 of 19)]
[im 1/49]
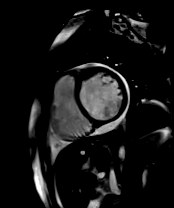

[Series 33: cine_trufi_cs_rt_short axis · oblique · 8.0mm · 1.73mm/px · 1 of 49 slices shown (10 of 19)]
[im 1/49]
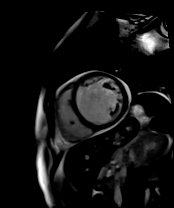

[Series 33: cine_trufi_cs_rt_short axis · oblique · 8.0mm · 1.73mm/px · 1 of 49 slices shown (11 of 19)]
[im 1/49]
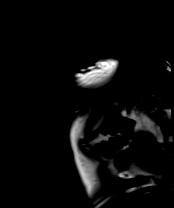

[Series 33: cine_trufi_cs_rt_short axis · oblique · 8.0mm · 1.73mm/px · 1 of 49 slices shown (12 of 19)]
[im 1/49]
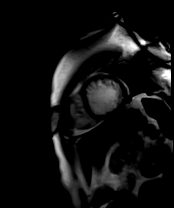

[Series 33: cine_trufi_cs_rt_short axis · oblique · 8.0mm · 1.73mm/px · 1 of 49 slices shown (13 of 19)]
[im 1/49]
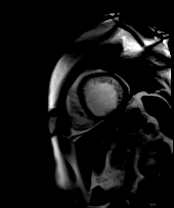

[Series 33: cine_trufi_cs_rt_short axis · oblique · 8.0mm · 1.73mm/px · 1 of 49 slices shown (14 of 19)]
[im 1/49]
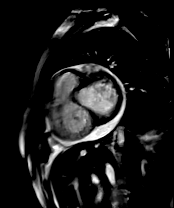

[Series 33: cine_trufi_cs_rt_short axis · oblique · 8.0mm · 1.73mm/px · 1 of 49 slices shown (15 of 19)]
[im 1/49]
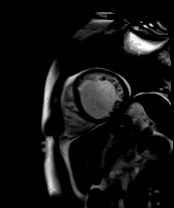

[Series 33: cine_trufi_cs_rt_short axis · oblique · 8.0mm · 1.73mm/px · 1 of 49 slices shown (16 of 19)]
[im 1/49]
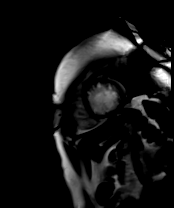

[Series 33: cine_trufi_cs_rt_short axis · oblique · 8.0mm · 1.73mm/px · 1 of 49 slices shown (17 of 19)]
[im 1/49]
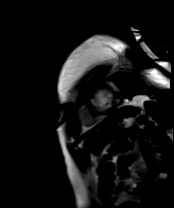

[Series 33: cine_trufi_cs_rt_short axis · oblique · 8.0mm · 1.73mm/px · 1 of 49 slices shown (18 of 19)]
[im 1/49]
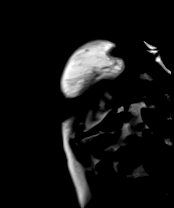

[Series 33: cine_trufi_cs_rt_short axis · oblique · 8.0mm · 1.73mm/px · 1 of 49 slices shown (19 of 19)]
[im 1/49]
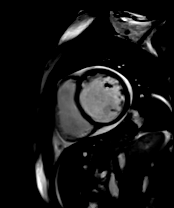

[44 of 48 positions shown; findings below may reference images not displayed]

FINDINGS: Moderate right pleural effusion with associated atelectasis.

Small circumferential pericardial effusion. The left ventricle was
severely dilated with normal wall thickness. No LV thrombus noted.
Diffuse LV hypokinesis, EF 12%. Moderately dilated RV with severe
systolic dysfunction, EF 18%. Moderate left and right atrial
dilation. There was at least moderate tricuspid regurgitation.
Probably mild mitral regurgitation. Trileaflet aortic valve with no
stenosis or regurgitation noted.

First pass perfusion images were normal.

Delayed enhancement images showed a small area of mid-wall late
gadolinium enhancement in the basal inferior wall and in the apical
inferolateral wall.

MEASUREMENTS:
MEASUREMENTS
LVEDV 482 mL
LVSV 60 mL
LVEF 12%

RVEDV 292 mL
RVSV 53 mL
RVEF 18%

T1 and T2 indices not accurate due to recent Feraheme.
IMPRESSION: 1.  Severely dilated LV with diffuse hypokinesis, EF 12%.

2.  Moderately dilated RV with EF 18%.

3. Delayed enhancement images difficult due to recent feraheme, but
there do appear to be small areas of non-coronary LGE in the basal
inferior and apical inferolateral walls. This could be suggestive of
prior myocarditis.

4.  T1 and T2 parametrics not accurate with recent feraheme.

Laaouina Tiger

## 2023-08-20 ENCOUNTER — Encounter: Payer: Self-pay | Admitting: Nurse Practitioner

## 2023-08-20 ENCOUNTER — Ambulatory Visit (INDEPENDENT_AMBULATORY_CARE_PROVIDER_SITE_OTHER): Payer: Medicaid Other | Admitting: Nurse Practitioner

## 2023-08-20 ENCOUNTER — Other Ambulatory Visit (HOSPITAL_COMMUNITY): Payer: Self-pay

## 2023-08-20 VITALS — BP 120/77 | HR 82 | Temp 97.2°F | Wt 168.8 lb

## 2023-08-20 DIAGNOSIS — D649 Anemia, unspecified: Secondary | ICD-10-CM

## 2023-08-20 DIAGNOSIS — Z13220 Encounter for screening for lipoid disorders: Secondary | ICD-10-CM | POA: Diagnosis not present

## 2023-08-20 DIAGNOSIS — Z1329 Encounter for screening for other suspected endocrine disorder: Secondary | ICD-10-CM | POA: Diagnosis not present

## 2023-08-20 DIAGNOSIS — G4700 Insomnia, unspecified: Secondary | ICD-10-CM

## 2023-08-20 MED ORDER — TRAZODONE HCL 100 MG PO TABS
100.0000 mg | ORAL_TABLET | Freq: Every day | ORAL | 2 refills | Status: AC
Start: 2023-08-20 — End: ?
  Filled 2023-08-20 – 2023-09-17 (×3): qty 90, 90d supply, fill #0
  Filled 2023-12-11: qty 90, 90d supply, fill #1
  Filled 2024-06-05: qty 90, 90d supply, fill #2

## 2023-08-20 NOTE — Progress Notes (Signed)
Subjective   Patient ID: Carlos Mckinney, male    DOB: February 06, 1996, 28 y.o.   MRN: [id number]  Chief Complaint  Patient presents with   Medical Management of Chronic Issues    Still having troubles sleeping the Trazodone isn't working     Referring provider: Ivonne Andrew, NP  ANTIONIO SCHMELZLE is a 28 y.o. male with Past Medical History: No date: Heart failure (HCC) No date: Hypertension No date: Polysubstance abuse [facility])   HPI  Patient presents today for follow-up visit.  Overall patient has been doing well.  We did discuss increasing dosage on trazodone because patient has still been having some trouble sleeping. Denies f/c/s, n/v/d, hemoptysis, PND, leg swelling Denies chest pain or edema     No Known Allergies  Immunization History  Administered Date(s) Administered   DTaP 12/04/1994, 02/12/1995, 04/26/1995   HIB (PRP-OMP) 12/04/1994, 02/12/1995, 04/26/1995   HPV Quadrivalent 06/29/2010   Hepatitis B Nov 13, 1994, 12/04/1994, 04/26/1995   IPV 12/04/1994, 02/12/1995, 04/26/1995   Influenza-Unspecified 06/29/2010   Janssen (J&J) SARS-COV-2 Vaccination 10/29/2019   MMR 09/27/1995    Tobacco History: Social History   Tobacco Use  Smoking Status Some Days   Types: Cigarettes  Smokeless Tobacco Never   Ready to quit: No Counseling given: Yes   Outpatient Encounter Medications as of 08/20/2023  Medication Sig   digoxin (LANOXIN) 0.125 MG tablet Take 1 tablet (0.125 mg total) by mouth daily. NEEDS FOLLOW UP APPOINTMENT   Ferrous Sulfate 90 (18 Fe) MG TABS Take 1 tablet by mouth daily.   furosemide (LASIX) 40 MG tablet Take 1.5 tablets (60 mg total) by mouth daily.   ibuprofen (ADVIL) 800 MG tablet Take 1 tablet (800 mg total) by mouth 3 (three) times daily.   rivaroxaban (XARELTO) 20 MG TABS tablet Take 1 tablet (20 mg total) by mouth daily.   sacubitril-valsartan (ENTRESTO) 49-51 MG Take 1 tablet by mouth 2 (two) times daily.   spironolactone (ALDACTONE) 25 MG  tablet Take 1 tablet (25 mg total) by mouth daily.   traZODone (DESYREL) 100 MG tablet Take 1 tablet (100 mg total) by mouth at bedtime.   [DISCONTINUED] traZODone (DESYREL) 50 MG tablet Take 1/2-1 tablet (25-50 mg total) by mouth at bedtime as needed for sleep.   albuterol (VENTOLIN HFA) 108 (90 Base) MCG/ACT inhaler Inhale 1-2 puffs into the lungs every 6 (six) hours as needed for wheezing or shortness of breath. (Patient not taking: Reported on 08/20/2023)   amoxicillin-clavulanate (AUGMENTIN) 875-125 MG tablet Take 1 tablet by mouth every 12 (twelve) hours. (Patient not taking: Reported on 08/20/2023)   clotrimazole (LOTRIMIN AF JOCK ITCH) 1 % cream Apply 1 Application topically 2 (two) times daily. (Patient not taking: Reported on 08/20/2023)   docusate sodium (COLACE) 100 MG capsule Take 1 capsule (100 mg total) by mouth 2 (two) times daily. (Patient not taking: Reported on 08/20/2023)   hydrocortisone (ANUSOL-HC) 2.5 % rectal cream Place 1 Application rectally 2 (two) times daily. (Patient not taking: Reported on 08/20/2023)   nystatin cream (MYCOSTATIN) Apply 1 Application topically 2 (two) times daily. (Patient not taking: Reported on 08/20/2023)   No facility-administered encounter medications on file as of 08/20/2023.    Review of Systems  Review of Systems  Constitutional: Negative.   HENT: Negative.    Cardiovascular: Negative.   Gastrointestinal: Negative.   Allergic/Immunologic: Negative.   Neurological: Negative.   Psychiatric/Behavioral: Negative.       Objective:   BP 120/77   Pulse  82   Temp (!) 97.2 F (36.2 C)   Wt 168 lb 12.8 oz (76.6 kg)   SpO2 100%   BMI 24.93 kg/m   Wt Readings from Last 5 Encounters:  08/20/23 168 lb 12.8 oz (76.6 kg)  05/02/23 170 lb (77.1 kg)  03/14/23 173 lb (78.5 kg)  02/02/23 174 lb (78.9 kg)  11/02/22 178 lb 12.8 oz (81.1 kg)     Physical Exam Vitals and nursing note reviewed.  Constitutional:      General: He is not in acute  distress.    Appearance: He is well-developed.  Cardiovascular:     Rate and Rhythm: Normal rate and regular rhythm.  Pulmonary:     Effort: Pulmonary effort is normal.     Breath sounds: Normal breath sounds.  Skin:    General: Skin is warm and dry.  Neurological:     Mental Status: He is alert and oriented to person, place, and time.       Assessment & Plan:   Insomnia, unspecified type -     traZODone HCl; Take 1 tablet (100 mg total) by mouth at bedtime.  Dispense: 90 tablet; Refill: 2  Thyroid disorder screen -     TSH  Anemia, unspecified type -     CBC -     Comprehensive metabolic panel -     Iron, TIBC and Ferritin Panel  Lipid screening -     Lipid panel     Return in about 6 months (around 02/17/2024), or if symptoms worsen or fail to improve.   Ivonne Andrew, NP 08/20/2023

## 2023-08-20 NOTE — Patient Instructions (Signed)
1. Insomnia, unspecified type (Primary)  - traZODone (DESYREL) 100 MG tablet; Take 1 tablet (100 mg total) by mouth at bedtime.  Dispense: 90 tablet; Refill: 2  2. Thyroid disorder screen  - TSH  3. Anemia, unspecified type  - CBC - Comprehensive metabolic panel - Iron, TIBC and Ferritin Panel  4. Lipid screening  - Lipid Panel

## 2023-08-21 LAB — LIPID PANEL
Chol/HDL Ratio: 2.9 {ratio} (ref 0.0–5.0)
Cholesterol, Total: 163 mg/dL (ref 100–199)
HDL: 57 mg/dL (ref >39)
LDL Chol Calc (NIH): 90 mg/dL (ref 0–99)
Triglycerides: 84 mg/dL (ref 0–149)
VLDL Cholesterol Cal: 16 mg/dL (ref 5–40)

## 2023-08-21 LAB — COMPREHENSIVE METABOLIC PANEL
ALT: 7 [IU]/L (ref 0–44)
AST: 18 [IU]/L (ref 0–40)
Albumin: 4.6 g/dL (ref 4.3–5.2)
Alkaline Phosphatase: 76 [IU]/L (ref 44–121)
BUN/Creatinine Ratio: 8 — ABNORMAL LOW (ref 9–20)
BUN: 10 mg/dL (ref 6–20)
Bilirubin Total: 0.6 mg/dL (ref 0.0–1.2)
CO2: 26 mmol/L (ref 20–29)
Calcium: 10.2 mg/dL (ref 8.7–10.2)
Chloride: 102 mmol/L (ref 96–106)
Creatinine, Ser: 1.31 mg/dL — ABNORMAL HIGH (ref 0.76–1.27)
Globulin, Total: 2.4 g/dL (ref 1.5–4.5)
Glucose: 90 mg/dL (ref 70–99)
Potassium: 4.2 mmol/L (ref 3.5–5.2)
Sodium: 143 mmol/L (ref 134–144)
Total Protein: 7 g/dL (ref 6.0–8.5)
eGFR: 76 mL/min/{1.73_m2} (ref >59)

## 2023-08-21 LAB — CBC
Hematocrit: 41 % (ref 37.5–51.0)
Hemoglobin: 13.7 g/dL (ref 13.0–17.7)
MCH: 32.5 pg (ref 26.6–33.0)
MCHC: 33.4 g/dL (ref 31.5–35.7)
MCV: 97 fL (ref 79–97)
Platelets: 185 10*3/uL (ref 150–450)
RBC: 4.21 x10E6/uL (ref 4.14–5.80)
RDW: 12.1 % (ref 11.6–15.4)
WBC: 4.2 10*3/uL (ref 3.4–10.8)

## 2023-08-21 LAB — IRON,TIBC AND FERRITIN PANEL
Ferritin: 136 ng/mL (ref 30–400)
Iron Saturation: 47 % (ref 15–55)
Iron: 135 ug/dL (ref 38–169)
Total Iron Binding Capacity: 285 ug/dL (ref 250–450)
UIBC: 150 ug/dL (ref 111–343)

## 2023-08-21 LAB — TSH: TSH: 0.754 u[IU]/mL (ref 0.450–4.500)

## 2023-08-29 ENCOUNTER — Ambulatory Visit: Payer: BLUE CROSS/BLUE SHIELD | Admitting: Gastroenterology

## 2023-08-30 ENCOUNTER — Other Ambulatory Visit (HOSPITAL_COMMUNITY): Payer: Self-pay

## 2023-09-13 ENCOUNTER — Other Ambulatory Visit (HOSPITAL_COMMUNITY): Payer: Self-pay

## 2023-09-13 ENCOUNTER — Other Ambulatory Visit (HOSPITAL_COMMUNITY): Payer: Self-pay | Admitting: Family Medicine

## 2023-09-13 MED ORDER — FUROSEMIDE 40 MG PO TABS
60.0000 mg | ORAL_TABLET | Freq: Every day | ORAL | 3 refills | Status: DC
  Filled 2023-09-13: qty 45, 30d supply, fill #0
  Filled 2023-10-29: qty 45, 30d supply, fill #1
  Filled 2023-12-11: qty 45, 30d supply, fill #2
  Filled 2024-01-16: qty 45, 30d supply, fill #3

## 2023-09-17 ENCOUNTER — Other Ambulatory Visit (HOSPITAL_COMMUNITY): Payer: Self-pay

## 2023-09-18 ENCOUNTER — Other Ambulatory Visit (HOSPITAL_COMMUNITY): Payer: Self-pay | Admitting: Internal Medicine

## 2023-09-18 ENCOUNTER — Other Ambulatory Visit (HOSPITAL_COMMUNITY): Payer: Self-pay

## 2023-09-18 MED ORDER — SPIRONOLACTONE 25 MG PO TABS
25.0000 mg | ORAL_TABLET | Freq: Every day | ORAL | 1 refills | Status: DC
  Filled 2023-09-18: qty 90, 90d supply, fill #0
  Filled 2023-12-11: qty 90, 90d supply, fill #1

## 2023-09-19 ENCOUNTER — Other Ambulatory Visit (HOSPITAL_COMMUNITY): Payer: Self-pay

## 2023-09-20 ENCOUNTER — Other Ambulatory Visit (HOSPITAL_COMMUNITY): Payer: Self-pay

## 2023-10-29 ENCOUNTER — Other Ambulatory Visit (HOSPITAL_COMMUNITY): Payer: Self-pay

## 2023-10-29 ENCOUNTER — Other Ambulatory Visit (HOSPITAL_COMMUNITY): Payer: Self-pay | Admitting: Internal Medicine

## 2023-10-29 MED ORDER — ENTRESTO 49-51 MG PO TABS
1.0000 | ORAL_TABLET | Freq: Two times a day (BID) | ORAL | 0 refills | Status: DC
  Filled 2023-10-29: qty 60, 30d supply, fill #0

## 2023-11-05 ENCOUNTER — Ambulatory Visit: Payer: BLUE CROSS/BLUE SHIELD | Admitting: Gastroenterology

## 2023-11-05 NOTE — Progress Notes (Deleted)
 HPI :     ED visit 11/2020 with cough, dyspnea, tachycardia/tachypnea.  Chest x-ray with bilateral mild to moderate perihilar and bibasilar infiltrates.  Some suspicion for COVID, pt prescribed CAP coverage and left AMA before covid testing performed.     Incarcerated from 5/22-9/22.    Admitted 8/22 at [facility] and transferred to [facility] for acute systolic HF and was seen by AHF team. Echo 8/22 EF 15-20%, mildly dilated RV with normal RV function, mild MR, moderate TR, PAP 40-45 mmHg. Coronary angiogram not performed. Presumed to be NICM. Reports heavy cocaine use for a period of time before incarcerated.  Diuresed with IV lasix and GDMT titrated.     Established care 05/02/21, mild volume overload and NYHA II-III. Lasix increased. Seen in ED 05/24/21 for CP and swelling, left without being seen.   Follow up in clinic 11/22, fluid overloaded after running out of meds. Diuretics restarted and close f/u was advised but he no-showed x  2 appts.    Direct admit from clinic 2/23 for a/c CHF and cardiogenic shock. PICC line placed for CVP and co-ox monitoring. Initial co-ox 24%, started on inotrope support with milrinone. Diuresed > 30  lbs with IV lasix. Weaned off milrinone once diuresed. UDS + for cocaine and marijuana. Started on GDMT. Refused R/LHC. cMRI - with severe LVEF 12% and RV 18%. Delayed enhancement images poor due to recent feraheme. Previously stopped Eliquis due to hematochezia (felt to be 2/2 hemorrhoids), restarted. He was adamant to go home despite CoOx 40%. Discussed risks and PICC removed. Discharged home, weight 196 lbs.   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 UDS+ cocaine and THC. Underwent mechanical thrombectomyHe continued with left-sided weakness and aphasia. CIR was recommended but patient refused. He was discharged home on Xarelto + ASA with wife, weight 206 lbs.   At last visit he was  here with his girlfriend. He was clearly intoxicated/high. Refused drug testing.    Follow up 11/23, NYHA II, Farxiga restarted. Mental status made it difficult to assess, ? Deficits from stroke vs drug use. Referred to Neuro.   Today he returns for HF follow up with his mother, who helps provide history. Overall feeling fine. He has SOB sometimes with activity. No further rectal bleeding or abnormal bleeding since stopped [place], continues on Xarelto. He has rare atypical chest pain with occasional palpitations. Denies dizziness, edema, or PND/Orthopnea. Appetite ok. No fever or chills. Weight at home 170 pounds. Taking all medications. Mother helps manage his meds, now on disability as he is unable to work. Smokes 1-2 cigars/day, occasional THC and ETOH use, and no further cocaine use. He has been referred to GI for rectal bleeding. Lives with his mother, has 4 children    Past Medical History:  Diagnosis Date   Heart failure (HCC)    Hypertension    Polysubstance abuse (HCC)      Past Surgical History:  Procedure Laterality Date   IR CT HEAD LTD  09/22/2021   IR PERCUTANEOUS ART THROMBECTOMY/INFUSION INTRACRANIAL INC DIAG ANGIO  09/20/2021   RADIOLOGY WITH ANESTHESIA N/A 09/20/2021   Procedure: IR WITH ANESTHESIA;  Surgeon: Radiologist, Medication, MD;  Location: [facility] OR;  Service: Radiology;  Laterality: N/A;   Family History  Problem Relation Age of Onset   Hypertension Mother    Social History   Tobacco Use   Smoking status: Some Days    Types: Cigarettes  Smokeless tobacco: Never  Substance Use Topics   Alcohol use: No   Drug use: No   Current Outpatient Medications  Medication Sig Dispense Refill   albuterol (VENTOLIN HFA) 108 (90 Base) MCG/ACT inhaler Inhale 1-2 puffs into the lungs every 6 (six) hours as needed for wheezing or shortness of breath. (Patient not taking: Reported on 08/20/2023)     amoxicillin-clavulanate (AUGMENTIN) 875-125 MG tablet Take 1 tablet by mouth  every 12 (twelve) hours. (Patient not taking: Reported on 08/20/2023) 14 tablet 0   clotrimazole (LOTRIMIN AF JOCK ITCH) 1 % cream Apply 1 Application topically 2 (two) times daily. (Patient not taking: Reported on 08/20/2023) 30 g 0   digoxin (LANOXIN) 0.125 MG tablet Take 1 tablet (0.125 mg total) by mouth daily. NEEDS FOLLOW UP APPOINTMENT 30 tablet 6   docusate sodium (COLACE) 100 MG capsule Take 1 capsule (100 mg total) by mouth 2 (two) times daily. (Patient not taking: Reported on 08/20/2023) 10 capsule 0   Ferrous Sulfate 90 (18 Fe) MG TABS Take 1 tablet by mouth daily. 30 tablet 0   furosemide (LASIX) 40 MG tablet Take 1&1/2 tablets (60 mg total) by mouth daily. 45 tablet 3   hydrocortisone (ANUSOL-HC) 2.5 % rectal cream Place 1 Application rectally 2 (two) times daily. (Patient not taking: Reported on 08/20/2023) 30 g 0   ibuprofen (ADVIL) 800 MG tablet Take 1 tablet (800 mg total) by mouth 3 (three) times daily. 21 tablet 0   nystatin cream (MYCOSTATIN) Apply 1 Application topically 2 (two) times daily. (Patient not taking: Reported on 08/20/2023) 30 g 0   rivaroxaban (XARELTO) 20 MG TABS tablet Take 1 tablet (20 mg total) by mouth daily. 30 tablet 11   sacubitril-valsartan (ENTRESTO) 49-51 MG Take 1 tablet by mouth 2 (two) times daily. NEEDS FOLLOW UP APPOINTMENT FOR MORE REFILLS 60 tablet 0   spironolactone (ALDACTONE) 25 MG tablet Take 1 tablet (25 mg total) by mouth daily. NEEDS FOLLOW UP APPOINTMENT FOR MORE REFILLS 90 tablet 1   traZODone (DESYREL) 100 MG tablet Take 1 tablet (100 mg total) by mouth at bedtime. 90 tablet 2   No current facility-administered medications for this visit.   No Known Allergies   Review of Systems: All systems reviewed and negative except where noted in HPI.    No results found.  Physical Exam: There were no vitals taken for this visit. Constitutional: Pleasant,well-developed, ***male in no acute distress. HEENT: Normocephalic and atraumatic.  Conjunctivae are normal. No scleral icterus. Neck supple.  Cardiovascular: Normal rate, regular rhythm.  Pulmonary/chest: Effort normal and breath sounds normal. No wheezing, rales or rhonchi. Abdominal: Soft, nondistended, nontender. Bowel sounds active throughout. There are no masses palpable. No hepatomegaly. Extremities: no edema Lymphadenopathy: No cervical adenopathy noted. Neurological: Alert and oriented to person place and time. Skin: Skin is warm and dry. No rashes noted. Psychiatric: Normal mood and affect. Behavior is normal.  CBC    Component Value Date/Time   WBC 4.2 08/20/2023 1524   WBC 4.5 09/22/2022 0908   RBC 4.21 08/20/2023 1524   RBC 4.34 09/22/2022 0908   HGB 13.7 08/20/2023 1524   HCT 41.0 08/20/2023 1524   PLT 185 08/20/2023 1524   MCV 97 08/20/2023 1524   MCH 32.5 08/20/2023 1524   MCH 32.0 09/22/2022 0908   MCHC 33.4 08/20/2023 1524   MCHC 33.0 09/22/2022 0908   RDW 12.1 08/20/2023 1524   LYMPHSABS 1.2 09/24/2021 0052   MONOABS 0.8 09/24/2021 0052   EOSABS  0.2 09/24/2021 0052   BASOSABS 0.0 09/24/2021 0052    CMP     Component Value Date/Time   NA 143 08/20/2023 1524   K 4.2 08/20/2023 1524   CL 102 08/20/2023 1524   CO2 26 08/20/2023 1524   GLUCOSE 90 08/20/2023 1524   GLUCOSE 98 05/02/2023 0943   BUN 10 08/20/2023 1524   CREATININE 1.31 (H) 08/20/2023 1524   CALCIUM 10.2 08/20/2023 1524   PROT 7.0 08/20/2023 1524   ALBUMIN 4.6 08/20/2023 1524   AST 18 08/20/2023 1524   ALT 7 08/20/2023 1524   ALKPHOS 76 08/20/2023 1524   BILITOT 0.6 08/20/2023 1524   GFRNONAA >60 05/02/2023 0943       Latest Ref Rng & Units 08/20/2023    3:24 PM 02/02/2023    3:53 PM 09/22/2022    9:08 AM  CBC EXTENDED  WBC 3.4 - 10.8 x10E3/uL 4.2  10.3  4.5   RBC 4.14 - 5.80 x10E6/uL 4.21  3.64  4.34   Hemoglobin 13.0 - 17.7 g/dL 09.8  9.5  11.9   HCT 14.7 - 51.0 % 41.0  29.3  42.1   Platelets 150 - 450 x10E3/uL 185  446  208       ASSESSMENT AND  PLAN:  Ivonne Andrew, NP

## 2023-12-11 ENCOUNTER — Other Ambulatory Visit (HOSPITAL_COMMUNITY): Payer: Self-pay | Admitting: Adult Health

## 2023-12-11 ENCOUNTER — Other Ambulatory Visit (HOSPITAL_COMMUNITY): Payer: Self-pay

## 2023-12-11 ENCOUNTER — Other Ambulatory Visit (HOSPITAL_COMMUNITY): Payer: Self-pay | Admitting: Internal Medicine

## 2023-12-13 ENCOUNTER — Other Ambulatory Visit: Payer: Self-pay

## 2023-12-13 ENCOUNTER — Other Ambulatory Visit (HOSPITAL_COMMUNITY): Payer: Self-pay

## 2023-12-13 MED ORDER — ENTRESTO 49-51 MG PO TABS
1.0000 | ORAL_TABLET | Freq: Two times a day (BID) | ORAL | 0 refills | Status: DC
  Filled 2023-12-13: qty 60, 30d supply, fill #0

## 2023-12-13 MED ORDER — DIGOXIN 125 MCG PO TABS
0.1250 mg | ORAL_TABLET | Freq: Every day | ORAL | 6 refills | Status: DC
  Filled 2023-12-13: qty 30, 30d supply, fill #0

## 2023-12-13 MED ORDER — RIVAROXABAN 20 MG PO TABS
20.0000 mg | ORAL_TABLET | Freq: Every day | ORAL | 11 refills | Status: AC
  Filled 2023-12-13: qty 30, 30d supply, fill #0
  Filled 2024-01-16: qty 30, 30d supply, fill #1
  Filled 2024-03-03: qty 30, 30d supply, fill #2
  Filled 2024-04-03: qty 30, 30d supply, fill #3
  Filled 2024-05-09: qty 30, 30d supply, fill #4
  Filled 2024-06-18: qty 30, 30d supply, fill #5
  Filled 2024-08-01 (×2): qty 30, 30d supply, fill #6
  Filled 2024-09-05: qty 30, 30d supply, fill #7

## 2023-12-25 ENCOUNTER — Ambulatory Visit (HOSPITAL_COMMUNITY)
Admission: RE | Admit: 2023-12-25 | Discharge: 2023-12-25 | Disposition: A | Discharge status: Home / Self Care | Payer: MEDICAID | Source: Ambulatory Visit | Attending: Internal Medicine | Admitting: Internal Medicine

## 2023-12-25 ENCOUNTER — Other Ambulatory Visit (HOSPITAL_COMMUNITY): Payer: Self-pay

## 2023-12-25 VITALS — BP 104/76 | HR 72 | Wt 168.4 lb

## 2023-12-25 DIAGNOSIS — Z720 Tobacco use: Secondary | ICD-10-CM | POA: Diagnosis not present

## 2023-12-25 DIAGNOSIS — Z86718 Personal history of other venous thrombosis and embolism: Secondary | ICD-10-CM

## 2023-12-25 DIAGNOSIS — I5020 Unspecified systolic (congestive) heart failure: Secondary | ICD-10-CM | POA: Diagnosis not present

## 2023-12-25 DIAGNOSIS — F1410 Cocaine abuse, uncomplicated: Secondary | ICD-10-CM

## 2023-12-25 DIAGNOSIS — I639 Cerebral infarction, unspecified: Secondary | ICD-10-CM

## 2023-12-25 LAB — BASIC METABOLIC PANEL WITH GFR
Anion gap: 8 (ref 5–15)
BUN: 11 mg/dL (ref 6–20)
CO2: 29 mmol/L (ref 22–32)
Calcium: 9.5 mg/dL (ref 8.9–10.3)
Chloride: 100 mmol/L (ref 98–111)
Creatinine, Ser: 1.1 mg/dL (ref 0.61–1.24)
GFR, Estimated: >60 mL/min (ref >60)
Glucose, Bld: 95 mg/dL (ref 70–99)
Potassium: 4 mmol/L (ref 3.5–5.1)
Sodium: 137 mmol/L (ref 135–145)

## 2023-12-25 LAB — BRAIN NATRIURETIC PEPTIDE: B Natriuretic Peptide: 86.5 pg/mL (ref 0.0–100.0)

## 2023-12-25 MED ORDER — METOPROLOL SUCCINATE ER 25 MG PO TB24
25.0000 mg | ORAL_TABLET | Freq: Every day | ORAL | 6 refills | Status: AC
  Filled 2023-12-25: qty 30, 30d supply, fill #0
  Filled 2024-01-23: qty 30, 30d supply, fill #1
  Filled 2024-03-03: qty 30, 30d supply, fill #2
  Filled 2024-05-09: qty 30, 30d supply, fill #3
  Filled 2024-06-18: qty 30, 30d supply, fill #4
  Filled 2024-08-15: qty 30, 30d supply, fill #5

## 2023-12-25 NOTE — Patient Instructions (Signed)
 Great to see you today!!!  Medication Changes:  STOP Digoxin   START Metoprolol  Succinate 25 mg Daily  Lab Work:  Labs done today, your results will be available in MyChart, we will contact you for abnormal readings.   Testing/Procedures:  Your physician has requested that you have an echocardiogram. Echocardiography is a painless test that uses sound waves to create images of your heart. It provides your doctor with information about the size and shape of your heart and how well your heart's chambers and valves are working. This procedure takes approximately one hour. There are no restrictions for this procedure. Please do NOT wear cologne, perfume, aftershave, or lotions (deodorant is allowed). Please arrive 15 minutes prior to your appointment time.  Please note: We ask at that you not bring children with you during ultrasound (echo/ vascular) testing. Due to room size and safety concerns, children are not allowed in the ultrasound rooms during exams. Our front office staff cannot provide observation of children in our lobby area while testing is being conducted. An adult accompanying a patient to their appointment will only be allowed in the ultrasound room at the discretion of the ultrasound technician under special circumstances. We apologize for any inconvenience.  Special Instructions // Education:  Do the following things EVERYDAY: Weigh yourself in the morning before breakfast. Write it down and keep it in a log. Take your medicines as prescribed Eat low salt foods--Limit salt (sodium) to 2000 mg per day.  Stay as active as you can everyday Limit all fluids for the day to less than 2 liters   Follow-Up in: 3 months   At the [facility], you and your health needs are our priority. We have a designated team specialized in the treatment of Heart Failure. This Care Team includes your primary Heart Failure Specialized Cardiologist (physician), Advanced Practice  Providers (APPs- Physician Assistants and Nurse Practitioners), and Pharmacist who all work together to provide you with the care you need, when you need it.   You may see any of the following providers on your designated Care Team at your next follow up:  Dr. Jules Oar Dr. Peder Bourdon Dr. Alwin Baars Dr. Judyth Nunnery Nieves Bars, NP Ruddy Corral, [place] [facility] [place], [place] Dennise Fitz, NP [place] Lee, NP Luster Salters, PharmD   Please be sure to bring in all your medications bottles to every appointment.   Need to Contact Us :  If you have any questions or concerns before your next appointment please send us  a message through [place] or call our office at [phone].    TO LEAVE A MESSAGE FOR THE NURSE SELECT OPTION 2, PLEASE LEAVE A MESSAGE INCLUDING: YOUR NAME DATE OF BIRTH CALL BACK NUMBER REASON FOR CALL**this is important as we prioritize the call backs  YOU WILL RECEIVE A CALL BACK THE SAME DAY AS LONG AS YOU CALL BEFORE 4:00 PM

## 2023-12-25 NOTE — Progress Notes (Signed)
 ADVANCED HF CLINIC NOTE  Primary Care: Jerrlyn Morel, NP HF Cardiologist: Dr. Julane Ny  Reason for Visit: Chronic Systolic Heart Failure   HPI: Carlos Mckinney is a 28 y.o.male with hx tobacco use, cocaine abuse, CVA and systolic HF.  Incarcerated from 5/22-9/22.   Admitted 8/22 at [facility] and transferred to [facility] for acute systolic HF and was seen by AHF team. Echo 8/22 EF 15-20%, mildly dilated RV with normal RV function, mild MR, moderate TR, PAP 40-45 mmHg. Coronary angiogram not performed. Presumed to be NICM. Reports heavy cocaine use for a period of time before incarcerated.  Diuresed with IV lasix  and GDMT titrated.    Established care 05/02/21, mild volume overload and NYHA II-III. Lasix  increased. Seen in ED 05/24/21 for CP and swelling, left without being seen.  Follow up in clinic 11/22, fluid overloaded after running out of meds. Diuretics restarted and close f/u was advised but he no-showed x  2 appts.   Direct admit from clinic 2/23 for a/c CHF and cardiogenic shock. PICC line placed for CVP and co-ox monitoring. Initial co-ox 24%, started on inotrope support with milrinone . Diuresed > 30  lbs with IV lasix . Weaned off milrinone  once diuresed. UDS + for cocaine and marijuana. Started on GDMT. Refused R/LHC. cMRI - with severe LVEF 12% and RV 18%. Delayed enhancement images poor due to recent feraheme . Previously stopped Eliquis  due to hematochezia (felt to be 2/2 hemorrhoids), restarted. He was adamant to go home despite CoOx 40%. Discussed risks and PICC removed. Discharged home, weight 196 lbs.  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 UDS+ cocaine and THC. Underwent mechanical thrombectomyHe continued with left-sided weakness and aphasia. CIR was recommended but patient refused. He was discharged home on Xarelto  + ASA with wife, weight 206 lbs.  Follow up 11/23, NYHA II, Farxiga  restarted.  Mental status made it difficult to assess, ? Deficits from stroke vs drug use. Referred to Neuro.  Today he returns for HF follow up with his mother who helps with history He can answer most questions but unable to answer others. Mom makes sure he takes his meds. She follows BP at home. Smoking 2 Black and Milds per day. Occasional beer. No cocaine. Feels weak. Struggles with ADLs. + edema. No orthopnea or PND   Review of systems complete and found to be negative unless listed in HPI.    Current Outpatient Medications  Medication Sig Dispense Refill   albuterol  (VENTOLIN  HFA) 108 (90 Base) MCG/ACT inhaler Inhale 1-2 puffs into the lungs every 6 (six) hours as needed for wheezing or shortness of breath.     clotrimazole  (EQ JOCK ITCH) 1 % cream Apply 1 Application topically 2 (two) times daily.     digoxin  (LANOXIN ) 0.125 MG tablet Take 1 tablet (0.125 mg total) by mouth daily. NEEDS FOLLOW UP APPOINTMENT 30 tablet 6   docusate sodium  (COLACE) 100 MG capsule Take 1 capsule (100 mg total) by mouth 2 (two) times daily. (Patient taking differently: Take 100 mg by mouth 2 (two) times daily. As needed) 10 capsule 0   Ferrous Sulfate  90 (18 Fe) MG TABS Take 1 tablet by mouth daily. 30 tablet 0   furosemide  (LASIX ) 40 MG tablet Take 1&1/2 tablets (60 mg total) by mouth daily. 45 tablet 3   hydrocortisone  (ANUSOL -HC) 2.5 % rectal cream Place 1 Application rectally 2 (two) times daily. (Patient taking differently: Place 1 Application rectally 2 (two)  times daily. As needed) 30 g 0   ibuprofen  (ADVIL ) 800 MG tablet Take 1 tablet (800 mg total) by mouth 3 (three) times daily. (Patient taking differently: Take 800 mg by mouth 3 (three) times daily. As needed) 21 tablet 0   rivaroxaban  (XARELTO ) 20 MG TABS tablet Take 1 tablet (20 mg total) by mouth daily. 30 tablet 11   sacubitril -valsartan  (ENTRESTO ) 49-51 MG Take 1 tablet by mouth 2 (two) times daily. NEEDS FOLLOW UP APPOINTMENT FOR MORE REFILLS 60 tablet 0    spironolactone  (ALDACTONE ) 25 MG tablet Take 1 tablet (25 mg total) by mouth daily. NEEDS FOLLOW UP APPOINTMENT FOR MORE REFILLS 90 tablet 1   traZODone  (DESYREL ) 100 MG tablet Take 1 tablet (100 mg total) by mouth at bedtime. 90 tablet 2   No current facility-administered medications for this encounter.   No Known Allergies  Social History   Socioeconomic History   Marital status: Married    Spouse name: Not on file   Number of children: Not on file   Years of education: Not on file   Highest education level: Not on file  Occupational History   Not on file  Tobacco Use   Smoking status: Some Days    Types: Cigarettes   Smokeless tobacco: Never  Substance and Sexual Activity   Alcohol use: No   Drug use: No   Sexual activity: Not on file  Other Topics Concern   Not on file  Social History Narrative   Left handed   Social Drivers of Health   Financial Resource Strain: High Risk (10/03/2021)   Overall Financial Resource Strain (CARDIA)    Difficulty of Paying Living Expenses: Hard  Food Insecurity: No Food Insecurity (08/20/2023)   Hunger Vital Sign    Worried About Running Out of Food in the Last Year: Never true    Ran Out of Food in the Last Year: Never true  Transportation Needs: No Transportation Needs (08/20/2023)   PRAPARE - Administrator, Civil Service (Medical): No    Lack of Transportation (Non-Medical): No  Physical Activity: Not on file  Stress: Not on file  Social Connections: Not on file  Intimate Partner Violence: Not At Risk (08/20/2023)   Humiliation, Afraid, Rape, and Kick questionnaire    Fear of Current or Ex-Partner: No    Emotionally Abused: No    Physically Abused: No    Sexually Abused: No    BP 104/76   Pulse 72   Wt 76.4 kg (168 lb 6.4 oz)   SpO2 99%   BMI 24.87 kg/m   Wt Readings from Last 3 Encounters:  12/25/23 76.4 kg (168 lb 6.4 oz)  08/20/23 76.6 kg (168 lb 12.8 oz)  05/02/23 77.1 kg (170 lb)   PHYSICAL  EXAM: General: Sitting on exam table. No resp difficulty HEENT: normal Neck: supple. JVP 5-6 Carotids 2+ bilat; no bruits. No lymphadenopathy or thryomegaly appreciated. Cor: PMI laterally displaced. Regular rate & rhythm. +s3 Lungs: clear Abdomen: soft, nontender, nondistended. No hepatosplenomegaly. No bruits or masses. Good bowel sounds. Extremities: no cyanosis, clubbing, rash, no edema. Cool  Neuro: alert somewhat conversant, cranial nerves grossly intact. moves all 4 extremities w/o difficulty. Affect pleasant  ASSESSMENT & PLAN: 1. 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 Admit for a/c HF 08/22.  - Echo (8/22): EF 15-20%, mildly dilated RV with normal RV function, mild MR, moderate TR, PAP 40-45 mmHg - Echo 08/07/21 EF <  20%.  - Refused R/LHC, in past - cMRI 2/23 EF 12% RVEF 18% minimal LGE.  - Echo  04/27/22: EF 15% RV normal. G1DD - He is worse today. NYHA III-IIIb with probable low output - Volume ok  - Continue Entresto  49/51 mg bid. - Continue Lasix  60 mg daily + 20 KCL daily. - Stop Toprol  resume digoxin  0.125 daily - Continue spiro 25 mg daily. - Off Farxiga  with yeast. - Repeat echo  - Labs today - Long talk today with him and his mother about his situation. We discussed the fact that I suspect he will need advanced therapies in the near future but that he is not a candidate with ongoing tobacco and ETOH use. Stressed need for cessation and to remain complaint with meds. Suspect with previous CVAs may not be transplant candidate but may be possible VAD candidate  2. 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 - Continue Xarelto , no longer on ASA - Followed by Neuro   4. Substance Use - Cocaine/ETOH and tobacco use history  - UDS + cocaine and THC last admit 2/23 - Denies any further cocaine use, still smoking and occasional ETOH  - As above long talk about need for cessation to permit advanced therapies  5. PE/ RLE DVT - Diagnosed  08/06/21, placed on AC. - Continue Xarelto  - Labs today  I spent a total of 50 minutes today: 1) reviewing the patient's medical records including previous charts, labs and recent notes from other providers; 2) examining the patient and counseling them on their medical issues/explaining the plan of care; 3) adjusting meds as needed and 4) ordering lab work or other needed tests.     Jules Oar, MD 12/25/23

## 2024-01-16 ENCOUNTER — Other Ambulatory Visit (HOSPITAL_COMMUNITY): Payer: Self-pay

## 2024-01-23 ENCOUNTER — Other Ambulatory Visit: Payer: Self-pay

## 2024-01-23 ENCOUNTER — Other Ambulatory Visit (HOSPITAL_COMMUNITY): Payer: Self-pay | Admitting: Internal Medicine

## 2024-01-23 ENCOUNTER — Other Ambulatory Visit (HOSPITAL_COMMUNITY): Payer: Self-pay

## 2024-01-25 ENCOUNTER — Other Ambulatory Visit (HOSPITAL_COMMUNITY): Payer: Self-pay

## 2024-01-28 ENCOUNTER — Other Ambulatory Visit (HOSPITAL_COMMUNITY): Payer: Self-pay

## 2024-01-28 MED ORDER — ENTRESTO 49-51 MG PO TABS
1.0000 | ORAL_TABLET | Freq: Two times a day (BID) | ORAL | 0 refills | Status: DC
  Filled 2024-01-28: qty 60, 30d supply, fill #0

## 2024-02-08 ENCOUNTER — Ambulatory Visit (HOSPITAL_COMMUNITY): Payer: MEDICAID

## 2024-02-18 ENCOUNTER — Encounter: Payer: Self-pay | Admitting: Nurse Practitioner

## 2024-02-18 ENCOUNTER — Ambulatory Visit (INDEPENDENT_AMBULATORY_CARE_PROVIDER_SITE_OTHER): Payer: Self-pay | Admitting: Nurse Practitioner

## 2024-02-18 VITALS — BP 127/70 | HR 46 | Temp 98.2°F | Wt 158.0 lb

## 2024-02-18 DIAGNOSIS — D509 Iron deficiency anemia, unspecified: Secondary | ICD-10-CM

## 2024-02-18 NOTE — Progress Notes (Signed)
 Subjective   Patient ID: Carlos Mckinney, male    DOB: 06/28/1996, 28 y.o.   MRN: [id number]  Chief Complaint  Patient presents with   Medical Management of Chronic Issues    Referring provider: Oley Bascom RAMAN, NP  [name] is a 28 y.o. male with Past Medical History: No date: Heart failure (HCC) No date: Hypertension No date: Polysubstance abuse [facility])   HPI  Patient presents today for follow-up visit.  Overall patient has been doing well.  No new issues or concerns today.  Patient did recently have blood work.  Denies f/c/s, n/v/d, hemoptysis, PND, leg swelling Denies chest pain or edema.   No Known Allergies  Immunization History  Administered Date(s) Administered   DTaP 12/04/1994, 02/12/1995, 04/26/1995   HIB (PRP-OMP) 12/04/1994, 02/12/1995, 04/26/1995   HPV Quadrivalent 06/29/2010   Hepatitis B 1994/08/20, 12/04/1994, 04/26/1995   IPV 12/04/1994, 02/12/1995, 04/26/1995   Influenza-Unspecified 06/29/2010   Janssen (J&J) SARS-COV-2 Vaccination 10/29/2019   MMR 09/27/1995    Tobacco History: Social History   Tobacco Use  Smoking Status Some Days   Types: Cigarettes  Smokeless Tobacco Never   Ready to quit: Not Answered Counseling given: Yes   Outpatient Encounter Medications as of 02/18/2024  Medication Sig   Ferrous Sulfate  90 (18 Fe) MG TABS Take 1 tablet by mouth daily.   furosemide  (LASIX ) 40 MG tablet Take 1&1/2 tablets (60 mg total) by mouth daily.   metoprolol  succinate (TOPROL  XL) 25 MG 24 hr tablet Take 1 tablet (25 mg total) by mouth daily.   rivaroxaban  (XARELTO ) 20 MG TABS tablet Take 1 tablet (20 mg total) by mouth daily.   sacubitril -valsartan  (ENTRESTO ) 49-51 MG Take 1 tablet by mouth 2 (two) times daily. NEEDS FOLLOW UP APPOINTMENT FOR MORE REFILLS   spironolactone  (ALDACTONE ) 25 MG tablet Take 1 tablet (25 mg total) by mouth daily. NEEDS FOLLOW UP APPOINTMENT FOR MORE REFILLS   traZODone  (DESYREL ) 100 MG tablet Take 1 tablet (100  mg total) by mouth at bedtime.   albuterol  (VENTOLIN  HFA) 108 (90 Base) MCG/ACT inhaler Inhale 1-2 puffs into the lungs every 6 (six) hours as needed for wheezing or shortness of breath.   clotrimazole  (EQ JOCK ITCH) 1 % cream Apply 1 Application topically 2 (two) times daily.   docusate sodium  (COLACE) 100 MG capsule Take 1 capsule (100 mg total) by mouth 2 (two) times daily. (Patient taking differently: Take 100 mg by mouth 2 (two) times daily. As needed)   hydrocortisone  (ANUSOL -HC) 2.5 % rectal cream Place 1 Application rectally 2 (two) times daily. (Patient taking differently: Place 1 Application rectally 2 (two) times daily. As needed)   ibuprofen  (ADVIL ) 800 MG tablet Take 1 tablet (800 mg total) by mouth 3 (three) times daily. (Patient taking differently: Take 800 mg by mouth 3 (three) times daily. As needed)   No facility-administered encounter medications on file as of 02/18/2024.    Review of Systems  Review of Systems  Constitutional: Negative.   HENT: Negative.    Cardiovascular: Negative.   Gastrointestinal: Negative.   Allergic/Immunologic: Negative.   Neurological: Negative.   Psychiatric/Behavioral: Negative.       Objective:   BP 127/70   Pulse (!) 46   Temp 98.2 F (36.8 C) (Oral)   Wt 158 lb (71.7 kg)   SpO2 100%   BMI 23.33 kg/m   Wt Readings from Last 5 Encounters:  02/18/24 158 lb (71.7 kg)  12/25/23 168 lb 6.4 oz (76.4 kg)  08/20/23 168 lb 12.8 oz (76.6 kg)  05/02/23 170 lb (77.1 kg)  03/14/23 173 lb (78.5 kg)     Physical Exam Vitals and nursing note reviewed.  Constitutional:      General: He is not in acute distress.    Appearance: He is well-developed.  Cardiovascular:     Rate and Rhythm: Normal rate and regular rhythm.  Pulmonary:     Effort: Pulmonary effort is normal.     Breath sounds: Normal breath sounds.  Skin:    General: Skin is warm and dry.  Neurological:     Mental Status: He is alert and oriented to person, place, and  time.       Assessment & Plan:   Iron deficiency anemia, unspecified iron deficiency anemia type     Return in about 6 months (around 08/20/2024).   Bascom GORMAN Borer, NP 02/18/2024

## 2024-02-21 ENCOUNTER — Other Ambulatory Visit (HOSPITAL_COMMUNITY): Payer: Self-pay

## 2024-02-21 ENCOUNTER — Other Ambulatory Visit (HOSPITAL_COMMUNITY): Payer: Self-pay | Admitting: Family Medicine

## 2024-02-21 MED ORDER — FUROSEMIDE 40 MG PO TABS
60.0000 mg | ORAL_TABLET | Freq: Every day | ORAL | 3 refills | Status: DC
  Filled 2024-02-21: qty 45, 30d supply, fill #0
  Filled 2024-03-20: qty 45, 30d supply, fill #1
  Filled 2024-05-01: qty 45, 30d supply, fill #2
  Filled 2024-06-05: qty 45, 30d supply, fill #3

## 2024-02-29 ENCOUNTER — Ambulatory Visit (HOSPITAL_COMMUNITY)
Admission: RE | Admit: 2024-02-29 | Discharge: 2024-02-29 | Disposition: A | Discharge status: Home / Self Care | Payer: MEDICAID | Source: Ambulatory Visit | Attending: Internal Medicine | Admitting: Internal Medicine

## 2024-02-29 DIAGNOSIS — I5021 Acute systolic (congestive) heart failure: Secondary | ICD-10-CM | POA: Insufficient documentation

## 2024-02-29 DIAGNOSIS — I5020 Unspecified systolic (congestive) heart failure: Secondary | ICD-10-CM | POA: Diagnosis present

## 2024-02-29 DIAGNOSIS — I517 Cardiomegaly: Secondary | ICD-10-CM | POA: Insufficient documentation

## 2024-02-29 LAB — ECHOCARDIOGRAM COMPLETE
Area-P 1/2: 2.32 cm2
Calc EF: 24.1 %
S' Lateral: 5.6 cm
Single Plane A2C EF: 15.3 %
Single Plane A4C EF: 27.8 %

## 2024-02-29 NOTE — Progress Notes (Signed)
  Echocardiogram 2D Echocardiogram has been performed.  Carlos Mckinney 02/29/2024, 2:45 PM

## 2024-03-03 ENCOUNTER — Other Ambulatory Visit (HOSPITAL_COMMUNITY): Payer: Self-pay

## 2024-03-03 ENCOUNTER — Other Ambulatory Visit (HOSPITAL_COMMUNITY): Payer: Self-pay | Admitting: Internal Medicine

## 2024-03-03 MED ORDER — SACUBITRIL-VALSARTAN 49-51 MG PO TABS
1.0000 | ORAL_TABLET | Freq: Two times a day (BID) | ORAL | 0 refills | Status: DC
  Filled 2024-03-03: qty 60, 30d supply, fill #0

## 2024-03-20 ENCOUNTER — Other Ambulatory Visit (HOSPITAL_COMMUNITY): Payer: Self-pay

## 2024-03-25 ENCOUNTER — Telehealth (HOSPITAL_COMMUNITY): Payer: Self-pay

## 2024-03-25 NOTE — Progress Notes (Signed)
 ADVANCED HF CLINIC NOTE  Primary Care: Oley Bascom RAMAN, NP HF Cardiologist: Dr. Cherrie  Reason for Visit: Chronic Systolic Heart Failure   HPI: Carlos Mckinney is a 28 y.o.male with hx tobacco use, cocaine abuse, CVA and systolic HF.  Incarcerated from 5/22-9/22.   Admitted 8/22 at [facility] and transferred to [facility] for acute systolic HF and was seen by AHF team. Echo 8/22 EF 15-20%, mildly dilated RV with normal RV function, mild MR, moderate TR, PAP 40-45 mmHg. Coronary angiogram not performed. Presumed to be NICM. Reports heavy cocaine use for a period of time before incarcerated.  Diuresed with IV lasix  and GDMT titrated.    Established care 05/02/21, mild volume overload and NYHA II-III. Lasix  increased. Seen in ED 05/24/21 for CP and swelling, left without being seen.  Follow up in clinic 11/22, fluid overloaded after running out of meds. Diuretics restarted and close f/u was advised but he no-showed x  2 appts.   Direct admit from clinic 2/23 for a/c CHF and cardiogenic shock. PICC line placed for CVP and co-ox monitoring. Initial co-ox 24%, started on inotrope support with milrinone . Diuresed > 30  lbs with IV lasix . Weaned off milrinone  once diuresed. UDS + for cocaine and marijuana. Started on GDMT. Refused R/LHC. cMRI - with severe LVEF 12% and RV 18%. Delayed enhancement images poor due to recent feraheme . Previously stopped Eliquis  due to hematochezia (felt to be 2/2 hemorrhoids), restarted. He was adamant to go home despite CoOx 40%. Discussed risks and PICC removed. Discharged home, weight 196 lbs.  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 UDS+ cocaine and THC. Underwent mechanical thrombectomyHe continued with left-sided weakness and aphasia. CIR was recommended but patient refused. He was discharged home on Xarelto  + ASA with wife, weight 206 lbs.  Follow up 11/23, NYHA II, Farxiga  restarted.  Mental status made it difficult to assess, ? Deficits from stroke vs drug use. Referred to Neuro.  Today he returns for HF follow up with his mother who helps with history He can answer most questions but unable to answer others. Mom makes sure he takes his meds. She follows BP at home. Smoking 2 Black and Milds per day. Occasional beer. No cocaine. Feels weak. Struggles with ADLs. + edema. No orthopnea or PND   Review of systems complete and found to be negative unless listed in HPI.    Current Outpatient Medications  Medication Sig Dispense Refill   albuterol  (VENTOLIN  HFA) 108 (90 Base) MCG/ACT inhaler Inhale 1-2 puffs into the lungs every 6 (six) hours as needed for wheezing or shortness of breath.     clotrimazole  (EQ JOCK ITCH) 1 % cream Apply 1 Application topically 2 (two) times daily.     docusate sodium  (COLACE) 100 MG capsule Take 1 capsule (100 mg total) by mouth 2 (two) times daily. (Patient taking differently: Take 100 mg by mouth 2 (two) times daily. As needed) 10 capsule 0   Ferrous Sulfate  90 (18 Fe) MG TABS Take 1 tablet by mouth daily. 30 tablet 0   furosemide  (LASIX ) 40 MG tablet Take 1&1/2 tablets (60 mg total) by mouth daily. 45 tablet 3   hydrocortisone  (ANUSOL -HC) 2.5 % rectal cream Place 1 Application rectally 2 (two) times daily. (Patient taking differently: Place 1 Application rectally 2 (two) times daily. As needed) 30 g 0   ibuprofen  (ADVIL ) 800 MG tablet Take 1 tablet (800 mg total) by mouth 3 (  three) times daily. (Patient taking differently: Take 800 mg by mouth 3 (three) times daily. As needed) 21 tablet 0   metoprolol  succinate (TOPROL  XL) 25 MG 24 hr tablet Take 1 tablet (25 mg total) by mouth daily. 30 tablet 6   rivaroxaban  (XARELTO ) 20 MG TABS tablet Take 1 tablet (20 mg total) by mouth daily. 30 tablet 11   sacubitril -valsartan  (ENTRESTO ) 49-51 MG Take 1 tablet by mouth 2 (two) times daily. 60 tablet 0   spironolactone  (ALDACTONE ) 25 MG tablet Take 1 tablet  (25 mg total) by mouth daily. NEEDS FOLLOW UP APPOINTMENT FOR MORE REFILLS 90 tablet 1   traZODone  (DESYREL ) 100 MG tablet Take 1 tablet (100 mg total) by mouth at bedtime. 90 tablet 2   No current facility-administered medications for this visit.   No Known Allergies  Social History   Socioeconomic History   Marital status: Married    Spouse name: Not on file   Number of children: Not on file   Years of education: Not on file   Highest education level: Not on file  Occupational History   Not on file  Tobacco Use   Smoking status: Some Days    Types: Cigarettes   Smokeless tobacco: Never  Substance and Sexual Activity   Alcohol use: No   Drug use: No   Sexual activity: Not on file  Other Topics Concern   Not on file  Social History Narrative   Left handed   Social Drivers of Health   Financial Resource Strain: High Risk (10/03/2021)   Overall Financial Resource Strain (CARDIA)    Difficulty of Paying Living Expenses: Hard  Food Insecurity: No Food Insecurity (08/20/2023)   Hunger Vital Sign    Worried About Running Out of Food in the Last Year: Never true    Ran Out of Food in the Last Year: Never true  Transportation Needs: No Transportation Needs (08/20/2023)   PRAPARE - Administrator, Civil Service (Medical): No    Lack of Transportation (Non-Medical): No  Physical Activity: Not on file  Stress: Not on file  Social Connections: Not on file  Intimate Partner Violence: Not At Risk (08/20/2023)   Humiliation, Afraid, Rape, and Kick questionnaire    Fear of Current or Ex-Partner: No    Emotionally Abused: No    Physically Abused: No    Sexually Abused: No    There were no vitals taken for this visit.  Wt Readings from Last 3 Encounters:  02/18/24 71.7 kg (158 lb)  12/25/23 76.4 kg (168 lb 6.4 oz)  08/20/23 76.6 kg (168 lb 12.8 oz)   PHYSICAL EXAM: General: Sitting on exam table. No resp difficulty HEENT: normal Neck: supple. JVP 5-6 Carotids 2+  bilat; no bruits. No lymphadenopathy or thryomegaly appreciated. Cor: PMI laterally displaced. Regular rate & rhythm. +s3 Lungs: clear Abdomen: soft, nontender, nondistended. No hepatosplenomegaly. No bruits or masses. Good bowel sounds. Extremities: no cyanosis, clubbing, rash, no edema. Cool  Neuro: alert somewhat conversant, cranial nerves grossly intact. moves all 4 extremities w/o difficulty. Affect pleasant  ASSESSMENT & PLAN: 1. 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 Admit for a/c HF 08/22.  - Echo (8/22): EF 15-20%, mildly dilated RV with normal RV function, mild MR, moderate TR, PAP 40-45 mmHg - Echo 08/07/21 EF < 20%.  - Refused R/LHC, in past - cMRI 2/23 EF 12% RVEF 18% minimal LGE.  - Echo  04/27/22: EF 15% RV  normal. G1DD - He is worse today. NYHA III-IIIb with probable low output - Volume ok  - Continue Entresto  49/51 mg bid. - Continue Lasix  60 mg daily + 20 KCL daily. - Stop Toprol  resume digoxin  0.125 daily - Continue spiro 25 mg daily. - Off Farxiga  with yeast. - Repeat echo  - Labs today - Long talk today with him and his mother about his situation. We discussed the fact that I suspect he will need advanced therapies in the near future but that he is not a candidate with ongoing tobacco and ETOH use. Stressed need for cessation and to remain complaint with meds. Suspect with previous CVAs may not be transplant candidate but may be possible VAD candidate  2. 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 - Continue Xarelto , no longer on ASA - Followed by Neuro   4. Substance Use - Cocaine/ETOH and tobacco use history  - UDS + cocaine and THC last admit 2/23 - Denies any further cocaine use, still smoking and occasional ETOH  - As above long talk about need for cessation to permit advanced therapies  5. PE/ RLE DVT - Diagnosed 08/06/21, placed on AC. - Continue Xarelto  - Labs today  I spent a total of 50 minutes today: 1) reviewing  the patient's medical records including previous charts, labs and recent notes from other providers; 2) examining the patient and counseling them on their medical issues/explaining the plan of care; 3) adjusting meds as needed and 4) ordering lab work or other needed tests.     Harlene CHRISTELLA Gainer, MD 03/25/24

## 2024-03-25 NOTE — Telephone Encounter (Signed)
 Called and spoke to pt's mother Melba to confirm/remind patient of their appointment at [facility] on 03/26/24.   Appointment:   [x] Confirmed  [] Left mess   [] No answer/No voice mail  [] VM Full/unable to leave message  [] Phone not in service  Patient reminded to bring all medications and/or complete list.  Confirmed patient has transportation. Gave directions, instructed to utilize valet parking.

## 2024-03-26 ENCOUNTER — Encounter (HOSPITAL_COMMUNITY): Payer: Self-pay

## 2024-03-26 ENCOUNTER — Ambulatory Visit (HOSPITAL_COMMUNITY)
Admission: RE | Admit: 2024-03-26 | Discharge: 2024-03-26 | Disposition: A | Discharge status: Home / Self Care | Source: Ambulatory Visit | Attending: Family Medicine | Admitting: Family Medicine

## 2024-03-26 VITALS — BP 122/62 | HR 61 | Ht 71.0 in | Wt 160.6 lb

## 2024-03-26 DIAGNOSIS — Z79899 Other long term (current) drug therapy: Secondary | ICD-10-CM | POA: Diagnosis not present

## 2024-03-26 DIAGNOSIS — F1410 Cocaine abuse, uncomplicated: Secondary | ICD-10-CM

## 2024-03-26 DIAGNOSIS — Z720 Tobacco use: Secondary | ICD-10-CM

## 2024-03-26 DIAGNOSIS — F1090 Alcohol use, unspecified, uncomplicated: Secondary | ICD-10-CM | POA: Insufficient documentation

## 2024-03-26 DIAGNOSIS — Z716 Tobacco abuse counseling: Secondary | ICD-10-CM | POA: Diagnosis not present

## 2024-03-26 DIAGNOSIS — Z7901 Long term (current) use of anticoagulants: Secondary | ICD-10-CM | POA: Insufficient documentation

## 2024-03-26 DIAGNOSIS — I639 Cerebral infarction, unspecified: Secondary | ICD-10-CM | POA: Diagnosis not present

## 2024-03-26 DIAGNOSIS — Z8673 Personal history of transient ischemic attack (TIA), and cerebral infarction without residual deficits: Secondary | ICD-10-CM | POA: Diagnosis not present

## 2024-03-26 DIAGNOSIS — F17210 Nicotine dependence, cigarettes, uncomplicated: Secondary | ICD-10-CM | POA: Insufficient documentation

## 2024-03-26 DIAGNOSIS — Z86711 Personal history of pulmonary embolism: Secondary | ICD-10-CM | POA: Diagnosis not present

## 2024-03-26 DIAGNOSIS — I5022 Chronic systolic (congestive) heart failure: Secondary | ICD-10-CM | POA: Diagnosis present

## 2024-03-26 DIAGNOSIS — I428 Other cardiomyopathies: Secondary | ICD-10-CM | POA: Diagnosis not present

## 2024-03-26 DIAGNOSIS — Z86718 Personal history of other venous thrombosis and embolism: Secondary | ICD-10-CM | POA: Diagnosis not present

## 2024-03-26 LAB — BASIC METABOLIC PANEL WITH GFR
Anion gap: 9 (ref 5–15)
BUN: 11 mg/dL (ref 6–20)
CO2: 28 mmol/L (ref 22–32)
Calcium: 9.9 mg/dL (ref 8.9–10.3)
Chloride: 102 mmol/L (ref 98–111)
Creatinine, Ser: 1.27 mg/dL — ABNORMAL HIGH (ref 0.61–1.24)
GFR, Estimated: >60 mL/min (ref >60)
Glucose, Bld: 89 mg/dL (ref 70–99)
Potassium: 4.3 mmol/L (ref 3.5–5.1)
Sodium: 139 mmol/L (ref 135–145)

## 2024-03-26 LAB — BRAIN NATRIURETIC PEPTIDE: B Natriuretic Peptide: 57.6 pg/mL (ref 0.0–100.0)

## 2024-03-26 NOTE — Patient Instructions (Signed)
 No change in medications today. Labs today - will call you if abnormal. We have ordered a CPX test for you - see below. Return to see Dr. Cherrie in 3 months. CALL [phone] IN NOVEMBER TO SCHEDULE THIS APPOINTMENT.  Please call us  at [phone] if any questions or concerns prior to your next visit.    Your physician has recommended that you have a cardiopulmonary stress test (CPX). CPX testing is a non-invasive measurement of heart and lung function. It replaces a traditional treadmill stress test. This type of test provides a tremendous amount of information that relates not only to your present condition but also for future outcomes. This test combines measurements of you ventilation, respiratory gas exchange in the lungs, electrocardiogram (EKG), blood pressure and physical response before, during, and following an exercise protocol.

## 2024-03-28 ENCOUNTER — Ambulatory Visit (HOSPITAL_COMMUNITY): Payer: Self-pay | Admitting: Family Medicine

## 2024-04-03 ENCOUNTER — Other Ambulatory Visit (HOSPITAL_COMMUNITY): Payer: Self-pay

## 2024-04-04 ENCOUNTER — Other Ambulatory Visit (HOSPITAL_COMMUNITY): Payer: Self-pay

## 2024-04-16 ENCOUNTER — Other Ambulatory Visit (HOSPITAL_COMMUNITY): Payer: Self-pay

## 2024-04-16 ENCOUNTER — Other Ambulatory Visit (HOSPITAL_COMMUNITY): Payer: Self-pay | Admitting: Internal Medicine

## 2024-04-16 MED ORDER — AMOXICILLIN 500 MG PO CAPS
500.0000 mg | ORAL_CAPSULE | Freq: Three times a day (TID) | ORAL | 0 refills | Status: DC
  Filled 2024-04-16: qty 21, 7d supply, fill #0

## 2024-04-16 MED ORDER — SACUBITRIL-VALSARTAN 49-51 MG PO TABS
1.0000 | ORAL_TABLET | Freq: Two times a day (BID) | ORAL | 0 refills | Status: DC
  Filled 2024-04-16: qty 60, 30d supply, fill #0

## 2024-04-17 ENCOUNTER — Encounter (HOSPITAL_COMMUNITY)

## 2024-05-01 ENCOUNTER — Other Ambulatory Visit (HOSPITAL_COMMUNITY): Payer: Self-pay

## 2024-05-01 ENCOUNTER — Ambulatory Visit: Payer: Self-pay

## 2024-05-01 ENCOUNTER — Other Ambulatory Visit: Payer: Self-pay

## 2024-05-01 ENCOUNTER — Other Ambulatory Visit (HOSPITAL_COMMUNITY): Payer: Self-pay | Admitting: Internal Medicine

## 2024-05-01 MED ORDER — SPIRONOLACTONE 25 MG PO TABS
25.0000 mg | ORAL_TABLET | Freq: Every day | ORAL | 1 refills | Status: AC
  Filled 2024-05-01: qty 90, 90d supply, fill #0
  Filled 2024-08-15: qty 90, 90d supply, fill #1

## 2024-05-01 NOTE — Telephone Encounter (Signed)
 Pt is having issues with medictaion that may be causing skin rash on inner thigh. OTC Jock itch medication is not working. Please addvise [facility]

## 2024-05-01 NOTE — Telephone Encounter (Signed)
 FYI Only or Action Required?: FYI only for provider.  Patient was last seen in primary care on 02/18/2024 by Oley Bascom RAMAN, NP.  Called Nurse Triage reporting Groin Itching.  Symptoms began 1 year ago.  Interventions attempted: OTC medications: Jock Itch Cream.  Symptoms are: stable.  Triage Disposition: Home Care  Patient/caregiver understands and will follow disposition?: Yes Reason for Disposition  [1] Slowly expanding pink-red rash involving inner thigh(s) next to scrotum AND [2] itchy  Answer Assessment - Initial Assessment Questions Mom calling on behalf of patient, states he was taking a medication, can't remember which one, that was causing yeast in his groin area, but was taken off the medication and is still experiencing the issue. Mom states he is using jock itch cream daily.   1. APPEARANCE of RASH: What does the rash look like?      Mom states patient states there is an irritated rash  2. LOCATION: Where is the rash located?      Groin area  3. ONSET: When did the rash start?      A while mom states  4. ITCHING: Does the rash itch? If Yes, ask: How bad is the itch?  (Scale 1-10; or mild, moderate, severe)     Moderate itching  Protocols used: Jock Itch-A-AH  Copied from CRM [id number]. Topic: Clinical - Medical Advice >> May 01, 2024 10:44 AM Darshell M wrote: Reason for CRM: Patient mother calling in. Patient was taking medication that was causing yeast in his groin. He was taken off the medication but he still have a problem with the yeast. Has to use the jock itch cream daily.

## 2024-05-02 NOTE — Telephone Encounter (Signed)
Pt mother was advised Kh

## 2024-05-05 ENCOUNTER — Ambulatory Visit (HOSPITAL_COMMUNITY)
Admission: EM | Admit: 2024-05-05 | Discharge: 2024-05-05 | Disposition: A | Discharge status: Home / Self Care | Attending: Internal Medicine | Admitting: Internal Medicine

## 2024-05-05 ENCOUNTER — Encounter (HOSPITAL_COMMUNITY): Payer: Self-pay | Admitting: Emergency Medicine

## 2024-05-05 ENCOUNTER — Other Ambulatory Visit (HOSPITAL_COMMUNITY): Payer: Self-pay

## 2024-05-05 DIAGNOSIS — N50811 Right testicular pain: Secondary | ICD-10-CM | POA: Diagnosis not present

## 2024-05-05 DIAGNOSIS — N451 Epididymitis: Secondary | ICD-10-CM | POA: Diagnosis not present

## 2024-05-05 LAB — POCT URINALYSIS DIP (MANUAL ENTRY)
Bilirubin, UA: NEGATIVE
Blood, UA: NEGATIVE
Glucose, UA: NEGATIVE mg/dL
Ketones, POC UA: NEGATIVE mg/dL
Nitrite, UA: NEGATIVE
Protein Ur, POC: NEGATIVE mg/dL
Spec Grav, UA: 1.015 (ref 1.010–1.025)
Urobilinogen, UA: 2 U/dL — AB
pH, UA: 7 (ref 5.0–8.0)

## 2024-05-05 MED ORDER — DOXYCYCLINE HYCLATE 100 MG PO CAPS
100.0000 mg | ORAL_CAPSULE | Freq: Two times a day (BID) | ORAL | 0 refills | Status: AC
Start: 2024-05-05 — End: 2024-05-12
  Filled 2024-05-05: qty 14, 7d supply, fill #0

## 2024-05-05 MED ORDER — LIDOCAINE HCL (PF) 1 % IJ SOLN
INTRAMUSCULAR | Status: AC
  Filled 2024-05-05: qty 2

## 2024-05-05 MED ORDER — CEFTRIAXONE SODIUM 1 G IJ SOLR
INTRAMUSCULAR | Status: AC
  Filled 2024-05-05: qty 10

## 2024-05-05 MED ORDER — CEFTRIAXONE SODIUM 1 G IJ SOLR
1.0000 g | Freq: Once | INTRAMUSCULAR | Status: AC
  Administered 2024-05-05: 1 g via INTRAMUSCULAR

## 2024-05-05 NOTE — ED Triage Notes (Signed)
 Pt's sister st's pt has a rash in his private area for several months  Mom is on the phone saying that pt was taking a medication and the rash is from the medication but he has been off the meds for a month

## 2024-05-05 NOTE — Discharge Instructions (Addendum)
 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  We gave you an injection of Rocephin today to treat the infection. Take doxycycline pills 1 pill every 12 hours for the next 7 days.  Please schedule a follow-up appointment with the urologist listed on your paperwork to discuss further treatment.  You will likely need a referral from your primary care provider to be able to see this urologist.  Please schedule an appointment with your primary care provider for follow-up in the next 5 to 7 days for recheck and for a referral to see a urology provider due to ongoing genitourinary discomfort and testicular discomfort.  If you develop any new or worsening symptoms or if your symptoms do not start to improve, please return here or follow-up with your primary care provider. If your symptoms are severe, please go to the emergency room.

## 2024-05-05 NOTE — ED Provider Notes (Addendum)
 [facility]    CSN: [id number] Arrival date & time: 05/05/24  1017      History   Chief Complaint Chief Complaint  Patient presents with   Rash    HPI Carlos Mckinney is a 28 y.o. male.   Carlos Mckinney is a 28 y.o. male with past medical history of polysubstance abuse, CVA with residual aphasia, pulmonary embolism, DVT, and CHF presenting with sister in person and mother via FaceTime on the phone who contributes to the history for chief complaint of irritation to the penile region that started several months ago and has worsened over the last few weeks.  Patient complains of generalized irritation to the skin of the scrotum.  He is circumcised.  Per family, the last chance he may have had to be sexually active was 6 months ago and he has been tested for STDs in the last 6 months with negative results.  Patient denies penile itching, penile discharge, urinary hesitancy, gross hematuria, dysuria, flank pain, abdominal pain, nausea, vomiting, fever, chills, and recent antibiotic or steroid use.  Denies recent changes in soaps, personal hygiene products, etc. Family states patient used to be on a medication that would contribute to persistent yeast dermatitis infections but has stopped this medication for the last month.  They are unsure of the name of the medication. Family/patient has not attempted use of any over-the-counter medications to help with irritation to the private genitourinary region prior to arrival.   Rash   Past Medical History:  Diagnosis Date   Heart failure (HCC)    Hypertension    Polysubstance abuse [facility])     Patient Active Problem List   Diagnosis Date Noted   Yeast dermatitis 11/06/2022   Constipation 09/28/2022   Acute ischemic stroke (HCC) 09/20/2021   Middle cerebral artery embolism, right 09/20/2021   CVA (cerebral vascular accident) (HCC) 09/20/2021   Acute on chronic diastolic congestive heart failure, NYHA class 3 (HCC) 09/01/2021    Iron deficiency anemia 08/07/2021   Multiple pulmonary emboli (HCC) 08/06/2021   Acute on chronic HFrEF (heart failure with reduced ejection fraction) (HCC) 08/06/2021   Acute deep vein thrombosis (DVT) of right lower extremity (HCC) 08/06/2021   Anemia 08/06/2021    Past Surgical History:  Procedure Laterality Date   IR CT HEAD LTD  09/22/2021   IR PERCUTANEOUS ART THROMBECTOMY/INFUSION INTRACRANIAL INC DIAG ANGIO  09/20/2021   RADIOLOGY WITH ANESTHESIA N/A 09/20/2021   Procedure: IR WITH ANESTHESIA;  Surgeon: Radiologist, Medication, MD;  Location: [facility] OR;  Service: Radiology;  Laterality: N/A;       Home Medications    Prior to Admission medications   Medication Sig Start Date End Date Taking? Authorizing Provider  doxycycline (VIBRAMYCIN) 100 MG capsule Take 1 capsule (100 mg total) by mouth 2 (two) times daily for 7 days. 05/05/24 05/12/24 Yes StanhopeDorna HERO, FNP  albuterol  (VENTOLIN  HFA) 108 (90 Base) MCG/ACT inhaler Inhale 1-2 puffs into the lungs every 6 (six) hours as needed for wheezing or shortness of breath.    [provider]  amoxicillin  (AMOXIL ) 500 MG capsule Take 1 capsule (500 mg total) by mouth every 8 (eight) hours until finished. 04/16/24     clotrimazole  (EQ JOCK ITCH) 1 % cream Apply 1 Application topically 2 (two) times daily.    [provider]  docusate sodium  (COLACE) 100 MG capsule Take 1 capsule (100 mg total) by mouth 2 (two) times daily. Patient taking differently: Take 100 mg by  mouth 2 (two) times daily. As needed 02/02/23   Oley Bascom RAMAN, NP  Ferrous Sulfate  90 (18 Fe) MG TABS Take 1 tablet by mouth daily. 02/05/23   Oley Bascom RAMAN, NP  furosemide  (LASIX ) 40 MG tablet Take 1&1/2 tablets (60 mg total) by mouth daily. 02/21/24   Bensimhon, Toribio SAUNDERS, MD  hydrocortisone  (ANUSOL -HC) 2.5 % rectal cream Place 1 Application rectally 2 (two) times daily. Patient taking differently: Place 1 Application rectally 2 (two) times daily. As needed  09/28/22   Nichols, Tonya S, NP  ibuprofen  (ADVIL ) 800 MG tablet Take 1 tablet (800 mg total) by mouth 3 (three) times daily. Patient taking differently: Take 800 mg by mouth 3 (three) times daily. As needed 06/08/23   Dreama, [place]  N, FNP  metoprolol  succinate (TOPROL  XL) 25 MG 24 hr tablet Take 1 tablet (25 mg total) by mouth daily. 12/25/23   Bensimhon, Toribio SAUNDERS, MD  rivaroxaban  (XARELTO ) 20 MG TABS tablet Take 1 tablet (20 mg total) by mouth daily. 12/13/23   Bensimhon, Toribio SAUNDERS, MD  sacubitril -valsartan  (ENTRESTO ) 49-51 MG Take 1 tablet by mouth 2 (two) times daily. 04/16/24   Bensimhon, Toribio SAUNDERS, MD  spironolactone  (ALDACTONE ) 25 MG tablet Take 1 tablet (25 mg total) by mouth daily. 05/01/24   Bensimhon, Toribio SAUNDERS, MD  traZODone  (DESYREL ) 100 MG tablet Take 1 tablet (100 mg total) by mouth at bedtime. 08/20/23   Oley Bascom RAMAN, NP    Family History Family History  Problem Relation Age of Onset   Hypertension Mother     Social History Social History   Tobacco Use   Smoking status: Some Days    Types: Cigarettes   Smokeless tobacco: Never  Substance Use Topics   Alcohol use: No   Drug use: No     Allergies   Patient has no known allergies.   Review of Systems Review of Systems  Skin:  Positive for rash.  Per HPI   Physical Exam Triage Vital Signs ED Triage Vitals  Encounter Vitals Group     BP 05/05/24 1041 124/71     Girls Systolic BP Percentile --      Girls Diastolic BP Percentile --      Boys Systolic BP Percentile --      Boys Diastolic BP Percentile --      Pulse Rate 05/05/24 1041 60     Resp 05/05/24 1041 16     Temp 05/05/24 1041 98.5 F (36.9 C)     Temp Source 05/05/24 1041 Oral     SpO2 05/05/24 1041 98 %     Weight --      Height --      Head Circumference --      Peak Flow --      Pain Score 05/05/24 1044 0     Pain Loc --      Pain Education --      Exclude from Growth Chart --    No data found.  Updated Vital Signs BP 124/71 (BP  Location: Left Arm)   Pulse 60   Temp 98.5 F (36.9 C) (Oral)   Resp 16   SpO2 98%   Visual Acuity Right Eye Distance:   Left Eye Distance:   Bilateral Distance:    Right Eye Near:   Left Eye Near:    Bilateral Near:     Physical Exam Vitals and nursing note reviewed. Exam conducted with a chaperone present (Diane, RN present for GU exam).  Constitutional:      Appearance: He is not ill-appearing or toxic-appearing.  HENT:     Head: Normocephalic and atraumatic.     Right Ear: Hearing and external ear normal.     Left Ear: Hearing and external ear normal.     Nose: Nose normal.     Mouth/Throat:     Lips: Pink.  Eyes:     General: Lids are normal. Vision grossly intact. Gaze aligned appropriately.     Extraocular Movements: Extraocular movements intact.     Conjunctiva/sclera: Conjunctivae normal.  Pulmonary:     Effort: Pulmonary effort is normal.  Abdominal:     General: Bowel sounds are normal.     Palpations: Abdomen is soft.     Tenderness: There is no abdominal tenderness. There is no right CVA tenderness, left CVA tenderness or guarding.     Hernia: There is no hernia in the left inguinal area or right inguinal area.  Genitourinary:    Penis: Normal and circumcised. No phimosis, paraphimosis, hypospadias, erythema, tenderness, discharge, swelling or lesions.      Testes:        Right: Tenderness present. Mass, swelling, testicular hydrocele or varicocele not present. Right testis is descended.        Left: Mass, tenderness, swelling, testicular hydrocele or varicocele not present. Left testis is descended.     Epididymis:     Right: Normal. Not inflamed or enlarged. No mass or tenderness.     Left: Normal.     Comments: Circumcised penis without signs of balanitis.  No penile lesions, erythema, or tenderness.  No penile discharge. Complains of pain to the right testicle with palpation over the anterior right testicle. Pain to the right testicle is alleviated by  elevation/support of the right testicle, pain comes back when support is released on exam.  Non-tender to palpation over the posterior right testicle/epididymis. No rash to overlying skin.  Musculoskeletal:     Cervical back: Neck supple.  Lymphadenopathy:     Lower Body: No right inguinal adenopathy. No left inguinal adenopathy.  Skin:    General: Skin is warm and dry.     Capillary Refill: Capillary refill takes less than 2 seconds.     Findings: No rash.  Neurological:     General: No focal deficit present.     Mental Status: He is alert and oriented to person, place, and time. Mental status is at baseline.     Cranial Nerves: No dysarthria or facial asymmetry.  Psychiatric:        Mood and Affect: Mood normal.        Speech: Speech normal.        Behavior: Behavior normal.        Thought Content: Thought content normal.        Judgment: Judgment normal.      UC Treatments / Results  Labs (all labs ordered are listed, but only abnormal results are displayed) Labs Reviewed  POCT URINALYSIS DIP (MANUAL ENTRY) - Abnormal; Notable for the following components:      Result Value   Urobilinogen, UA 2.0 (*)    Leukocytes, UA Trace (*)    All other components within normal limits  URINE CULTURE    EKG   Radiology No results found.  Procedures Procedures (including critical care time)  Medications Ordered in UC Medications  cefTRIAXone (ROCEPHIN) injection 1 g (1 g Intramuscular Given 05/05/24 1155)    Initial Impression / Assessment and Plan /  UC Course  I have reviewed the triage vital signs and the nursing notes.  Pertinent labs & imaging results that were available during my care of the patient were reviewed by me and considered in my medical decision making (see chart for details).   1. Right testicular pain, right epididymitis Visit conducted with patient and sister in room and mother via FaceTime to help with history.  He is minimally tender to palpation over  the right anterior testicular wall.  No masses present on exam.  Low suspicion for testicular torsion or mass, therefore we will not order an outpatient ultrasound of the scrotum.  There are no signs of dermatitis on exam of the GU region.  No signs of balanitis.  Urinalysis shows trace leukocytes and is otherwise unremarkable. Urine culture is pending.  Presentation is most consistent with epididymitis. We will treat with Rocephin 1 g IM and doxycycline twice daily for 7 days.  Mother and patient both declined STD testing today. Mother states he has been tested within the last 6 months and was negative for gonorrhea, chlamydia, and trichomonas.  Recommend follow-up with urology for ongoing management and evaluation of testicular pain given this has been a chronic problem for him over the last 3 to 4 months.  Recommend use of supportive underwear to provide relief of testicular discomfort and tylenol  PRN for pain.   Counseled patient on potential for adverse effects with medications prescribed/recommended today, strict ER and return-to-clinic precautions discussed, patient verbalized understanding.    Final Clinical Impressions(s) / UC Diagnoses   Final diagnoses:  Right testicular pain  Right epididymitis     Discharge Instructions      I believe that you are experiencing an infection of your spermatic cord called epididymitis.  We gave you an injection of Rocephin today to treat the infection. Take doxycycline pills 1 pill every 12 hours for the next 7 days.  Please schedule a follow-up appointment with the urologist listed on your paperwork to discuss further treatment.  You will likely need a referral from your primary care provider to be able to see this urologist.  Please schedule an appointment with your primary care provider for follow-up in the next 5 to 7 days for recheck and for a referral to see a urology provider due to ongoing genitourinary discomfort and  testicular discomfort.  If you develop any new or worsening symptoms or if your symptoms do not start to improve, please return here or follow-up with your primary care provider. If your symptoms are severe, please go to the emergency room.     ED Prescriptions     Medication Sig Dispense Auth. Provider   doxycycline (VIBRAMYCIN) 100 MG capsule Take 1 capsule (100 mg total) by mouth 2 (two) times daily for 7 days. 14 capsule Enedelia Dorna HERO, FNP      PDMP not reviewed this encounter.      Enedelia Dorna HERO, [place] 05/05/24 1204

## 2024-05-06 LAB — URINE CULTURE: Culture: NO GROWTH

## 2024-05-07 ENCOUNTER — Ambulatory Visit (HOSPITAL_COMMUNITY): Payer: Self-pay

## 2024-05-08 ENCOUNTER — Ambulatory Visit (HOSPITAL_COMMUNITY): Attending: Cardiology

## 2024-05-08 DIAGNOSIS — I5022 Chronic systolic (congestive) heart failure: Secondary | ICD-10-CM | POA: Diagnosis present

## 2024-05-09 ENCOUNTER — Other Ambulatory Visit: Payer: Self-pay

## 2024-05-13 DIAGNOSIS — I5022 Chronic systolic (congestive) heart failure: Secondary | ICD-10-CM | POA: Diagnosis not present

## 2024-05-15 ENCOUNTER — Telehealth (INDEPENDENT_AMBULATORY_CARE_PROVIDER_SITE_OTHER): Payer: Self-pay | Admitting: Nurse Practitioner

## 2024-05-15 ENCOUNTER — Other Ambulatory Visit: Payer: Self-pay

## 2024-05-15 ENCOUNTER — Other Ambulatory Visit (HOSPITAL_COMMUNITY): Payer: Self-pay | Admitting: Internal Medicine

## 2024-05-15 ENCOUNTER — Other Ambulatory Visit (HOSPITAL_COMMUNITY): Payer: Self-pay

## 2024-05-15 ENCOUNTER — Encounter: Payer: Self-pay | Admitting: Nurse Practitioner

## 2024-05-15 VITALS — Ht 71.0 in | Wt 160.0 lb

## 2024-05-15 DIAGNOSIS — N50811 Right testicular pain: Secondary | ICD-10-CM | POA: Diagnosis not present

## 2024-05-15 MED ORDER — ALBUTEROL SULFATE HFA 108 (90 BASE) MCG/ACT IN AERS
1.0000 | INHALATION_SPRAY | Freq: Four times a day (QID) | RESPIRATORY_TRACT | 0 refills | Status: DC | PRN
  Filled 2024-05-15: qty 6.7, 25d supply, fill #0

## 2024-05-15 MED ORDER — IBUPROFEN 800 MG PO TABS
800.0000 mg | ORAL_TABLET | Freq: Three times a day (TID) | ORAL | 0 refills | Status: DC
  Filled 2024-05-15: qty 21, 7d supply, fill #0

## 2024-05-15 MED ORDER — SACUBITRIL-VALSARTAN 49-51 MG PO TABS
1.0000 | ORAL_TABLET | Freq: Two times a day (BID) | ORAL | 3 refills | Status: AC
  Filled 2024-05-15: qty 60, 30d supply, fill #0
  Filled 2024-07-07: qty 60, 30d supply, fill #1
  Filled 2024-08-15: qty 60, 30d supply, fill #2

## 2024-05-15 NOTE — Progress Notes (Signed)
 Virtual Visit via Video Note  I connected with Carlos Mckinney on 05/15/24 at  2:20 PM EDT by a video enabled telemedicine application and verified that I am speaking with the correct person using two identifiers.  Location: Patient: home Provider: office   I discussed the limitations of evaluation and management by telemedicine and the availability of in person appointments. The patient expressed understanding and agreed to proceed.  History of Present Illness:  Patient presents today through video visit for follow-up for urgent care visit.  He was seen in urgent care for right testicular pain.  He was given Rocephin and he states that this has helped.  It was recommended at the urgent care that he follow-up with urology because this has been a chronic issue.  We will place a referral to urology today. Denies f/c/s, n/v/d, hemoptysis, PND, leg swelling Denies chest pain or edema     Observations/Objective:     05/15/2024    1:12 PM 05/05/2024   10:41 AM 03/26/2024    3:47 PM  Vitals with BMI  Height 5' 11  5' 11  Weight 160 lbs  160 lbs 10 oz  BMI 22.33  22.41  Systolic  124 122  Diastolic  71 62  Pulse  60 61      Assessment and Plan:  1. Right testicular pain (Primary)  - Ambulatory referral to Urology  Follow up:  Follow up as scheduled     I discussed the assessment and treatment plan with the patient. The patient was provided an opportunity to ask questions and all were answered. The patient agreed with the plan and demonstrated an understanding of the instructions.   The patient was advised to call back or seek an in-person evaluation if the symptoms worsen or if the condition fails to improve as anticipated.  I provided 22 minutes of non-face-to-face time during this encounter.   Bascom GORMAN Borer, NP

## 2024-05-26 ENCOUNTER — Other Ambulatory Visit (HOSPITAL_COMMUNITY): Payer: Self-pay

## 2024-05-26 MED ORDER — CHLORHEXIDINE GLUCONATE 0.12 % MT SOLN
15.0000 mL | Freq: Two times a day (BID) | OROMUCOSAL | 0 refills | Status: DC
  Filled 2024-05-26: qty 473, 16d supply, fill #0

## 2024-05-26 MED ORDER — ACETAMINOPHEN-CODEINE 300-30 MG PO TABS
1.0000 | ORAL_TABLET | Freq: Four times a day (QID) | ORAL | 0 refills | Status: AC | PRN
  Filled 2024-05-26: qty 10, 3d supply, fill #0

## 2024-05-26 MED ORDER — AMOXICILLIN 500 MG PO CAPS
500.0000 mg | ORAL_CAPSULE | Freq: Three times a day (TID) | ORAL | 0 refills | Status: DC
  Filled 2024-05-26: qty 21, 7d supply, fill #0

## 2024-06-05 ENCOUNTER — Other Ambulatory Visit: Payer: Self-pay

## 2024-07-14 ENCOUNTER — Other Ambulatory Visit (HOSPITAL_COMMUNITY): Payer: Self-pay

## 2024-07-14 ENCOUNTER — Other Ambulatory Visit (HOSPITAL_COMMUNITY): Payer: Self-pay | Admitting: Internal Medicine

## 2024-07-14 MED ORDER — FUROSEMIDE 40 MG PO TABS
60.0000 mg | ORAL_TABLET | Freq: Every day | ORAL | 3 refills | Status: AC
  Filled 2024-07-14: qty 45, 30d supply, fill #0
  Filled 2024-08-22: qty 45, 30d supply, fill #1

## 2024-07-15 ENCOUNTER — Emergency Department (HOSPITAL_COMMUNITY)

## 2024-07-15 ENCOUNTER — Emergency Department (HOSPITAL_COMMUNITY)
Admission: EM | Admit: 2024-07-15 | Discharge: 2024-07-16 | Disposition: A | Source: Home / Self Care | Attending: Emergency Medicine | Admitting: Emergency Medicine

## 2024-07-15 DIAGNOSIS — W19XXXA Unspecified fall, initial encounter: Secondary | ICD-10-CM

## 2024-07-15 DIAGNOSIS — S0990XA Unspecified injury of head, initial encounter: Secondary | ICD-10-CM | POA: Diagnosis present

## 2024-07-15 DIAGNOSIS — N179 Acute kidney failure, unspecified: Secondary | ICD-10-CM

## 2024-07-15 DIAGNOSIS — I509 Heart failure, unspecified: Secondary | ICD-10-CM | POA: Diagnosis not present

## 2024-07-15 DIAGNOSIS — W1812XA Fall from or off toilet with subsequent striking against object, initial encounter: Secondary | ICD-10-CM | POA: Diagnosis not present

## 2024-07-15 DIAGNOSIS — Z7901 Long term (current) use of anticoagulants: Secondary | ICD-10-CM | POA: Diagnosis not present

## 2024-07-15 DIAGNOSIS — M25561 Pain in right knee: Secondary | ICD-10-CM | POA: Diagnosis not present

## 2024-07-15 DIAGNOSIS — M546 Pain in thoracic spine: Secondary | ICD-10-CM | POA: Diagnosis not present

## 2024-07-15 LAB — CK: Total CK: 180 U/L (ref 49–397)

## 2024-07-15 LAB — CBC
HCT: 41.4 % (ref 39.0–52.0)
Hemoglobin: 13.9 g/dL (ref 13.0–17.0)
MCH: 33.1 pg (ref 26.0–34.0)
MCHC: 33.6 g/dL (ref 30.0–36.0)
MCV: 98.6 fL (ref 80.0–100.0)
Platelets: 162 K/uL (ref 150–400)
RBC: 4.2 MIL/uL — ABNORMAL LOW (ref 4.22–5.81)
RDW: 13.3 % (ref 11.5–15.5)
WBC: 7.2 K/uL (ref 4.0–10.5)
nRBC: 0 % (ref 0.0–0.2)

## 2024-07-15 LAB — COMPREHENSIVE METABOLIC PANEL WITH GFR
ALT: 15 U/L (ref 0–44)
AST: 29 U/L (ref 15–41)
Albumin: 4.7 g/dL (ref 3.5–5.0)
Alkaline Phosphatase: 75 U/L (ref 38–126)
Anion gap: 10 (ref 5–15)
BUN: 11 mg/dL (ref 6–20)
CO2: 28 mmol/L (ref 22–32)
Calcium: 9.9 mg/dL (ref 8.9–10.3)
Chloride: 100 mmol/L (ref 98–111)
Creatinine, Ser: 1.63 mg/dL — ABNORMAL HIGH (ref 0.61–1.24)
GFR, Estimated: 58 mL/min — ABNORMAL LOW (ref >60)
Glucose, Bld: 135 mg/dL — ABNORMAL HIGH (ref 70–99)
Potassium: 3.9 mmol/L (ref 3.5–5.1)
Sodium: 138 mmol/L (ref 135–145)
Total Bilirubin: 0.4 mg/dL (ref 0.0–1.2)
Total Protein: 7.5 g/dL (ref 6.5–8.1)

## 2024-07-15 LAB — I-STAT CHEM 8, ED
BUN: 11 mg/dL (ref 6–20)
Calcium, Ion: 1.21 mmol/L (ref 1.15–1.40)
Chloride: 99 mmol/L (ref 98–111)
Creatinine, Ser: 1.8 mg/dL — ABNORMAL HIGH (ref 0.61–1.24)
Glucose, Bld: 137 mg/dL — ABNORMAL HIGH (ref 70–99)
HCT: 45 % (ref 39.0–52.0)
Hemoglobin: 15.3 g/dL (ref 13.0–17.0)
Potassium: 3.9 mmol/L (ref 3.5–5.1)
Sodium: 138 mmol/L (ref 135–145)
TCO2: 27 mmol/L (ref 22–32)

## 2024-07-15 LAB — CBG MONITORING, ED: Glucose-Capillary: 142 mg/dL — ABNORMAL HIGH (ref 70–99)

## 2024-07-15 LAB — ETHANOL: Alcohol, Ethyl (B): <15 mg/dL (ref <15)

## 2024-07-15 MED ORDER — LACTATED RINGERS IV BOLUS
1000.0000 mL | Freq: Once | INTRAVENOUS | Status: AC
  Administered 2024-07-15: 23:00:00 1000 mL via INTRAVENOUS

## 2024-07-15 NOTE — Progress Notes (Signed)
 Chaplain stepped into pt room whilel rounding through ED.  Chaplain greeted pt and attempted to engage pt in conversation.  Pt would not engage.  [place] Chaplain

## 2024-07-15 NOTE — ED Provider Notes (Addendum)
 [place] EMERGENCY DEPARTMENT AT [facility] Provider Note   CSN: [id number] Arrival date & time: 07/15/24  2104     Patient presents with: Loss of Consciousness   Carlos Mckinney is a 28 y.o. male.   28 year old male history of polysubstance abuse, CVA with residual aphasia, PE and DVT on Xarelto , and CHF who presents to the emergency department with fall and confusion.  Patient reports that he was in the bathroom and fell off the toilet and hit his head.  Says that his head and neck hurt.  Also having some upper back pain.  Confused and unable to write additional history.  Denies any alcohol or substance use today.       Prior to Admission medications  Medication Sig Start Date End Date Taking? Authorizing Provider  acetaminophen -codeine  (TYLENOL  #3) 300-30 MG tablet Take 1 tablet by mouth every 6 (six) hours only as needed for dental pain. 05/26/24     albuterol  (VENTOLIN  HFA) 108 (90 Base) MCG/ACT inhaler Inhale 1-2 puffs into the lungs every 6 (six) hours as needed for wheezing or shortness of breath. 05/15/24   Oley Bascom RAMAN, NP  amoxicillin  (AMOXIL ) 500 MG capsule Take 1 capsule (500 mg total) by mouth every 8 (eight) hours until finished. 05/26/24     chlorhexidine  (PERIDEX ) 0.12 % solution Swish with 1 capful for one minute and spit out twice daily for 10 days. 05/26/24     clotrimazole  (EQ JOCK ITCH) 1 % cream Apply 1 Application topically 2 (two) times daily.    [provider]  docusate sodium  (COLACE) 100 MG capsule Take 1 capsule (100 mg total) by mouth 2 (two) times daily. Patient taking differently: Take 100 mg by mouth 2 (two) times daily. As needed 02/02/23   Oley Bascom RAMAN, NP  Ferrous Sulfate  90 (18 Fe) MG TABS Take 1 tablet by mouth daily. 02/05/23   Oley Bascom RAMAN, NP  furosemide  (LASIX ) 40 MG tablet Take 1&1/2 tablets (60 mg total) by mouth daily. 07/14/24   Bensimhon, Toribio SAUNDERS, MD  hydrocortisone  (ANUSOL -HC) 2.5 % rectal cream Place 1  Application rectally 2 (two) times daily. Patient taking differently: Place 1 Application rectally 2 (two) times daily. As needed 09/28/22   Nichols, Tonya S, NP  ibuprofen  (ADVIL ) 800 MG tablet Take 1 tablet (800 mg total) by mouth 3 (three) times daily. 05/15/24   Oley Bascom RAMAN, NP  metoprolol  succinate (TOPROL  XL) 25 MG 24 hr tablet Take 1 tablet (25 mg total) by mouth daily. 12/25/23   Bensimhon, Toribio SAUNDERS, MD  rivaroxaban  (XARELTO ) 20 MG TABS tablet Take 1 tablet (20 mg total) by mouth daily. 12/13/23   Bensimhon, Toribio SAUNDERS, MD  sacubitril -valsartan  (ENTRESTO ) 49-51 MG Take 1 tablet by mouth 2 (two) times daily. 05/15/24   Bensimhon, Toribio SAUNDERS, MD  spironolactone  (ALDACTONE ) 25 MG tablet Take 1 tablet (25 mg total) by mouth daily. 05/01/24   Bensimhon, Toribio SAUNDERS, MD  traZODone  (DESYREL ) 100 MG tablet Take 1 tablet (100 mg total) by mouth at bedtime. 08/20/23   Nichols, Tonya S, NP    Allergies: Patient has no known allergies.    Review of Systems  Updated Vital Signs BP (!) 106/57   Pulse (!) 52   Temp (!) 97.5 F (36.4 C) (Temporal)   Resp 20   Ht 5' 11 (1.803 m)   Wt 70.3 kg   SpO2 100%   BMI 21.62 kg/m   Physical Exam Vitals and nursing note reviewed.  Constitutional:      General: He is not in acute distress.    Appearance: He is well-developed.     Comments: GCS 14  HENT:     Head: Normocephalic and atraumatic.     Right Ear: External ear normal.     Left Ear: External ear normal.     Nose: Nose normal.  Eyes:     Extraocular Movements: Extraocular movements intact.     Conjunctiva/sclera: Conjunctivae normal.     Pupils: Pupils are equal, round, and reactive to light.     Comments: Pupils 3 mm bilateral  Neck:     Comments: C-collar in place Cardiovascular:     Rate and Rhythm: Normal rate and regular rhythm.     Heart sounds: Normal heart sounds.  Pulmonary:     Effort: Pulmonary effort is normal. No respiratory distress.     Breath sounds: Normal breath  sounds.  Abdominal:     General: There is no distension.     Palpations: Abdomen is soft. There is no mass.     Tenderness: There is no abdominal tenderness. There is no guarding.  Musculoskeletal:     Right lower leg: No edema.     Left lower leg: No edema.     Comments: Tenderness palpation of right knee without deformity.  No tenderness palpation of shoulders, elbows, wrists, hips, or ankles.  No lumbar spine tenderness palpation.  T-spine tenderness palpation approximately T4.  No step-offs  Skin:    General: Skin is warm and dry.  Neurological:     Mental Status: He is alert.     Comments: NIHSS Exam  Level of Consciousness: Alert  LOC Questions: Does not answer Month and Age Correctly  LOC Commands: Opens and Closes Eyes and Hands on command  Best Gaze: Horizontal ocular movements intact  Visual Fields: No visual field loss  Facial Palsy: None  L Upper Extremity Motor: No drift after 10 seconds  R Upper Extremity Motor: No drift after 10 seconds  L Lower extremity Motor: No drift after 5 seconds  R Lower extremity Motor: No drift after 5 seconds  Ataxia: Absent  Sensory: Intact sensation to light touch on face, arms, trunk, and legs bilaterally  Best Language: No aphasia  Dysarthria: No dysarthria  Neglect: No visual or sensory neglect     Psychiatric:        Mood and Affect: Mood normal.        Behavior: Behavior normal.     (all labs ordered are listed, but only abnormal results are displayed) Labs Reviewed  COMPREHENSIVE METABOLIC PANEL WITH GFR - Abnormal; Notable for the following components:      Result Value   Glucose, Bld 135 (*)    Creatinine, Ser 1.63 (*)    GFR, Estimated 58 (*)    All other components within normal limits  CBC - Abnormal; Notable for the following components:   RBC 4.20 (*)    All other components within normal limits  CBG MONITORING, ED - Abnormal; Notable for the following components:   Glucose-Capillary 142 (*)    All other  components within normal limits  I-STAT CHEM 8, ED - Abnormal; Notable for the following components:   Creatinine, Ser 1.80 (*)    Glucose, Bld 137 (*)    All other components within normal limits  ETHANOL  URINALYSIS, ROUTINE W REFLEX MICROSCOPIC  URINE DRUG SCREEN  CK    EKG: EKG Interpretation Date/Time:  Tuesday July 15 2024 21:18:57 EST Ventricular Rate:  64 PR Interval:  140 QRS Duration:  89 QT Interval:  409 QTC Calculation: 422 R Axis:   26  Text Interpretation: Ectopic atrial rhythm Inferior infarct, age indeterminate Lateral leads are also involved Confirmed by Yolande Charleston [phone]) on 07/15/2024 9:42:39 PM  Radiology: ARCOLA Knee Complete 4 Views Right Result Date: 07/15/2024 EXAM: 4 OR MORE VIEW(S) XRAY OF THE KNEE 07/15/2024 10:15:27 PM COMPARISON: None available. CLINICAL HISTORY: knee pain FINDINGS: BONES AND JOINTS: No acute fracture. No malalignment. No significant joint effusion. No significant degenerative changes. SOFT TISSUES: The soft tissues are unremarkable. IMPRESSION: 1. No acute findings. Electronically signed by: Oneil Devonshire MD 07/15/2024 10:34 PM EST RP Workstation: [id number]   CT Cervical Spine Wo Contrast Result Date: 07/15/2024 EXAM: CT CERVICAL SPINE WITHOUT CONTRAST 07/15/2024 09:50:00 PM TECHNIQUE: CT of the cervical spine was performed without the administration of intravenous contrast. Multiplanar reformatted images are provided for review. Automated exposure control, iterative reconstruction, and/or weight based adjustment of the mA/kV was utilized to reduce the radiation dose to as low as reasonably achievable. COMPARISON: None available. CLINICAL HISTORY: neck pain neck pain FINDINGS: BONES AND ALIGNMENT: No acute fracture or traumatic malalignment. DEGENERATIVE CHANGES: No significant degenerative changes. SOFT TISSUES: No prevertebral soft tissue swelling. IMPRESSION: 1. No acute fracture or traumatic malalignment. Electronically signed by:  Donnice Mania MD 07/15/2024 10:27 PM EST RP Workstation: [id number]   CT Head Wo Contrast Result Date: 07/15/2024 EXAM: CT HEAD WITHOUT CONTRAST 07/15/2024 09:50:00 PM TECHNIQUE: CT of the head was performed without the administration of intravenous contrast. Automated exposure control, iterative reconstruction, and/or weight based adjustment of the mA/kV was utilized to reduce the radiation dose to as low as reasonably achievable. COMPARISON: 03/12/2022 CLINICAL HISTORY: Head trauma, abnormal mental status (Age 30-64y) FINDINGS: BRAIN AND VENTRICLES: No acute hemorrhage. No evidence of acute infarct. Redemonstrated encephalomalacia in the right temporal lobe and right frontal operculum compatible with remote right MCA territory infarct. Additional left temporal encephalomalacia compatible with remote infarct. No hydrocephalus. No extra-axial collection. No mass effect or midline shift. ORBITS: No acute abnormality. SINUSES: Mild maxillary sinus mucosal thickening. SOFT TISSUES AND SKULL: No acute soft tissue abnormality. No skull fracture. IMPRESSION: 1. No acute intracranial abnormality. 2. Redemonstrated encephalomalacia in the right temporal lobe and right frontal operculum compatible with remote right MCA territory infarct. Additional left temporal encephalomalacia compatible with remote infarct. Electronically signed by: Donnice Mania MD 07/15/2024 10:20 PM EST RP Workstation: [id number]     Procedures   Medications Ordered in the ED  lactated ringers  bolus 1,000 mL (1,000 mLs Intravenous New Bag/Given 07/15/24 2231)    Clinical Course as of 07/15/24 2340  Tue Jul 15, 2024  2330 Signed out to Dr Theadore [RP]    Clinical Course User Index [RP] Yolande Charleston BROCKS, MD                                 Medical Decision Making Amount and/or Complexity of Data Reviewed Labs: ordered. Radiology: ordered.   ANNIE ROSEBOOM is a 28 year old male history of polysubstance abuse, CVA with residual  aphasia, PE and DVT on Xarelto , and CHF who presents to the emergency department with fall and confusion.    Initial Ddx:  Fall, TBI, C-spine injury, stroke  MDM/Course:  Patient presents emergency department after fall.  Does appear to be confused on exam but has had a stroke and it is  unclear what his baseline is.  Level 2 trauma was activated given the fact that he is on blood thinners, fell and hit his head, and is confused.  No focal neurologic deficits at this point in time.  Placed in c-collar.  He is complaining of pain in his back and his knee.  CT of his head and C-spine without acute findings.  X-ray of his thoracic spine without any fracture.  X-ray of the knee without fracture.  Upon re-evaluation family had come to the room and reports that his current mental status is at his baseline after his most recent stroke.  Lab work showed that he does have a mild AKI.  CK was sent.  Will give him 500cc IV fluids for this since he does not appear to be volume overloaded at this point.  Signed out to the oncoming physician awaiting blood work and repeat evaluation  This patient presents to the ED for concern of complaints listed in HPI, this involves an extensive number of treatment options, and is a complaint that carries with it a high risk of complications and morbidity. Disposition including potential need for admission considered.   Dispo: Pending remainder of workup  I have reviewed the patients home medications and made adjustments as needed Additional history obtained from mother Records reviewed Outpatient Clinic Notes The following labs were independently interpreted: Chemistry and show AKI and CKD I independently reviewed the following imaging with scope of interpretation limited to determining acute life threatening conditions related to emergency care: CT Head and agree with the radiologist interpretation with the following exceptions: none I personally reviewed and interpreted  cardiac monitoring: normal sinus rhythm  I personally reviewed and interpreted the pt's EKG: see above for interpretation   Portions of this note were generated with Dragon dictation software. Dictation errors may occur despite best attempts at proofreading.     Final diagnoses:  Fall, initial encounter  AKI (acute kidney injury)  Injury of head, initial encounter    ED Discharge Orders     None          Yolande Lamar BROCKS, MD 07/15/24 2341    Yolande Lamar BROCKS, MD 07/15/24 [phone]

## 2024-07-15 NOTE — ED Notes (Signed)
 Pt placed in c-collar

## 2024-07-15 NOTE — Progress Notes (Signed)
 Orthopedic Tech Progress Note Patient Details:  Carlos Mckinney 12/31/1995 [id number]  Patient ID: Carlos Mckinney, male   DOB: [birth year], 28 y.o.   MRN: [id number] Checked in for level 2 trauma.  Carlos Mckinney 07/15/2024, 9:34 PM

## 2024-07-15 NOTE — ED Notes (Signed)
 Pt c collar cleared by edp paterson at bedside. C collar was removed. Pt requests to wear c collar despite being informed c spine is cleared.

## 2024-07-15 NOTE — ED Notes (Signed)
Pt provided with urinal at bedside

## 2024-07-15 NOTE — ED Triage Notes (Signed)
 Pt BIB [facility] from home. Pt was in the bathroom then he fell off the toilet at hit his head. Pt is on xarelto . C/o head pain and n/v. Last time this happened he had a stroke. GCS 14  EMS 88SBP, 188/70BP 100% RA 117cbg 60 P 18LAC 300 LR

## 2024-07-16 LAB — URINALYSIS, ROUTINE W REFLEX MICROSCOPIC
Glucose, UA: NEGATIVE mg/dL
Hgb urine dipstick: NEGATIVE
Ketones, ur: 15 mg/dL — AB
Leukocytes,Ua: NEGATIVE
Nitrite: NEGATIVE
Protein, ur: 100 mg/dL — AB
Specific Gravity, Urine: 1.02 (ref 1.005–1.030)
pH: 6.5 (ref 5.0–8.0)

## 2024-07-16 LAB — URINE DRUG SCREEN
Amphetamines: NEGATIVE
Barbiturates: NEGATIVE
Benzodiazepines: NEGATIVE
Cocaine: NEGATIVE
Fentanyl: NEGATIVE
Methadone Scn, Ur: NEGATIVE
Opiates: NEGATIVE
Tetrahydrocannabinol: POSITIVE — AB

## 2024-07-16 LAB — URINALYSIS, MICROSCOPIC (REFLEX): Bacteria, UA: NONE SEEN

## 2024-07-16 NOTE — ED Notes (Signed)
 Pt ambulated to the restroom with no assistance from staff.

## 2024-07-16 NOTE — ED Provider Notes (Signed)
°  Provider Note MRN:  [id number]  Arrival date & time: 07/16/2024    ED Course and Medical Decision Making  Assumed care of patient at sign-out or upon transfer.  Patient doing much better on reassessment, ambulating without issue, blood pressures improved, he is back to baseline.  No focal deficits at all to suggest stroke.  CK is normal.  No indication for further testing or admission, appropriate for discharge.  Procedures  Final Clinical Impressions(s) / ED Diagnoses     ICD-10-CM   1. Fall, initial encounter  W19.XXXA     2. AKI (acute kidney injury)  N17.9     3. Injury of head, initial encounter  S09.90XA       ED Discharge Orders     None         Discharge Instructions      You were evaluated in the Emergency Department and after careful evaluation, we did not find any emergent condition requiring admission or further testing in the hospital.  Your exam/testing today is overall reassuring.  Please return to the Emergency Department if you experience any worsening of your condition.   Thank you for allowing us  to be a part of your care.    Ozell HERO. Theadore, MD [facility] [email or web address]    Theadore Ozell HERO, MD 07/16/24 SHARLYNE

## 2024-07-16 NOTE — Discharge Instructions (Signed)
You were evaluated in the Emergency Department and after careful evaluation, we did not find any emergent condition requiring admission or further testing in the hospital.  Your exam/testing today is overall reassuring.  Please return to the Emergency Department if you experience any worsening of your condition.   Thank you for allowing us to be a part of your care. 

## 2024-07-16 NOTE — Progress Notes (Signed)
 Orthopedic Tech Progress Note Patient Details:  Carlos Mckinney 05-01-1996 [id number] Applied knee sleeve per order.  Ortho Devices Type of Ortho Device: Knee Sleeve Ortho Device/Splint Location: RLE Ortho Device/Splint Interventions: Ordered, Application, Adjustment   Post Interventions Patient Tolerated: Well Instructions Provided: Adjustment of device, Care of device, Poper ambulation with device  Morna Pink 07/16/2024, 1:25 AM

## 2024-07-16 NOTE — ED Notes (Signed)
 Upon returning from restroom, pt refused to be placed back on monitoring.

## 2024-08-01 ENCOUNTER — Other Ambulatory Visit (HOSPITAL_COMMUNITY): Payer: Self-pay

## 2024-08-06 ENCOUNTER — Telehealth (HOSPITAL_COMMUNITY): Payer: Self-pay | Admitting: Internal Medicine

## 2024-08-06 NOTE — Telephone Encounter (Signed)
 Called to confirm/remind patient of their appointment at the [facility] on 08/06/24.   Appointment:   [x] Confirmed  [] Left mess   [] No answer/No voice mail  [] VM Full/unable to leave message  [] Phone not in service  Patient reminded to bring all medications and/or complete list.  Confirmed patient has transportation. Gave directions, instructed to utilize valet parking.

## 2024-08-07 ENCOUNTER — Encounter (HOSPITAL_COMMUNITY): Payer: Self-pay | Admitting: Internal Medicine

## 2024-08-07 ENCOUNTER — Ambulatory Visit (HOSPITAL_COMMUNITY)
Admission: RE | Admit: 2024-08-07 | Discharge: 2024-08-07 | Disposition: A | Discharge status: Home / Self Care | Payer: MEDICAID | Source: Ambulatory Visit | Attending: Internal Medicine | Admitting: Internal Medicine

## 2024-08-07 VITALS — BP 122/70 | HR 76 | Wt 160.6 lb

## 2024-08-07 DIAGNOSIS — I5022 Chronic systolic (congestive) heart failure: Secondary | ICD-10-CM | POA: Diagnosis not present

## 2024-08-07 DIAGNOSIS — Z79899 Other long term (current) drug therapy: Secondary | ICD-10-CM | POA: Insufficient documentation

## 2024-08-07 DIAGNOSIS — F1491 Cocaine use, unspecified, in remission: Secondary | ICD-10-CM | POA: Insufficient documentation

## 2024-08-07 DIAGNOSIS — I639 Cerebral infarction, unspecified: Secondary | ICD-10-CM

## 2024-08-07 DIAGNOSIS — F1291 Cannabis use, unspecified, in remission: Secondary | ICD-10-CM | POA: Insufficient documentation

## 2024-08-07 DIAGNOSIS — Z7901 Long term (current) use of anticoagulants: Secondary | ICD-10-CM | POA: Insufficient documentation

## 2024-08-07 DIAGNOSIS — F1410 Cocaine abuse, uncomplicated: Secondary | ICD-10-CM | POA: Diagnosis not present

## 2024-08-07 DIAGNOSIS — I517 Cardiomegaly: Secondary | ICD-10-CM | POA: Insufficient documentation

## 2024-08-07 DIAGNOSIS — Z86711 Personal history of pulmonary embolism: Secondary | ICD-10-CM | POA: Insufficient documentation

## 2024-08-07 DIAGNOSIS — Z8673 Personal history of transient ischemic attack (TIA), and cerebral infarction without residual deficits: Secondary | ICD-10-CM | POA: Insufficient documentation

## 2024-08-07 DIAGNOSIS — Z86718 Personal history of other venous thrombosis and embolism: Secondary | ICD-10-CM | POA: Diagnosis not present

## 2024-08-07 DIAGNOSIS — Z87891 Personal history of nicotine dependence: Secondary | ICD-10-CM | POA: Insufficient documentation

## 2024-08-07 DIAGNOSIS — F1091 Alcohol use, unspecified, in remission: Secondary | ICD-10-CM | POA: Insufficient documentation

## 2024-08-07 DIAGNOSIS — Z8679 Personal history of other diseases of the circulatory system: Secondary | ICD-10-CM | POA: Insufficient documentation

## 2024-08-07 NOTE — Patient Instructions (Signed)
 Continue current medications  Your physician recommends that you schedule a follow-up appointment in: 3 months   At the [facility], you and your health needs are our priority. As part of our continuing mission to provide you with exceptional heart care, we have created designated Provider Care Teams. These Care Teams include your primary Cardiologist (physician) and Advanced Practice Providers (APPs- Physician Assistants and Nurse Practitioners) who all work together to provide you with the care you need, when you need it.   You may see any of the following providers on your designated Care Team at your next follow up: Dr Toribio Fuel Dr Ezra Shuck Dr. Morene Brownie Greig Mosses, NP Caffie Shed, [place] [facility] [place], [place] Beckey Coe, NP Jordan Lee, NP Ellouise Class, NP Tinnie Redman, PharmD Jaun Bash, PharmD   Please be sure to bring in all your medications bottles to every appointment.    Thank you for choosing [facility]

## 2024-08-07 NOTE — Progress Notes (Signed)
 "  ADVANCED HF CLINIC NOTE  Primary Care: Oley Bascom RAMAN, NP HF Cardiologist: Dr. Cherrie  HPI: Carlos Mckinney is a 29 y.o. male with hx tobacco use, cocaine abuse, CVA and systolic HF.  Incarcerated from 5/22-9/22.   Admitted 8/22 at [facility] and transferred to [facility] for acute systolic HF and was seen by AHF team. Echo 8/22 EF 15-20%, mildly dilated RV with normal RV function, mild MR, moderate TR, PAP 40-45 mmHg. Coronary angiogram not performed. Presumed to be NICM. Reports heavy cocaine use for a period of time before incarcerated.  Diuresed with IV lasix  and GDMT titrated.    Established care 05/02/21, mild volume overload and NYHA II-III. Lasix  increased. Seen in ED 05/24/21 for CP and swelling, left without being seen.  Follow up in clinic 11/22, fluid overloaded after running out of meds. Diuretics restarted and close f/u was advised but he no-showed x  2 appts.   Direct admit from clinic 2/23 for a/c CHF and cardiogenic shock. PICC line placed for CVP and co-ox monitoring. Initial co-ox 24%, started on inotrope support with milrinone . Diuresed > 30  lbs with IV lasix . Weaned off milrinone  once diuresed. UDS + for cocaine and marijuana. Started on GDMT. Refused R/LHC. cMRI - with severe LVEF 12% and RV 18%. Delayed enhancement images poor due to recent feraheme . Previously stopped Eliquis  due to hematochezia (felt to be 2/2 hemorrhoids), restarted. He was adamant to go home despite CoOx 40%. Discussed risks and PICC removed. Discharged home, weight 196 lbs.  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 UDS+ cocaine and THC. Underwent mechanical thrombectomyHe continued with left-sided weakness and aphasia. CIR was recommended but patient refused. He was discharged home on Xarelto  + ASA with wife, weight 206 lbs.  Follow up 11/23, NYHA II, Farxiga  restarted. Mental status made it difficult to assess, ? Deficits  from stroke vs drug use. Referred to Neuro.  Echo 8/25 showed EF 20-25%, RV mildly reduced.  CPX 10/25: pVO2 14.4 VeVCo2 25 pRER 0.85 (submax test to nausea and joint pain)  Today he returns for HF follow up. His mother is not here with him today. He is restless and agitated. Not making sense. Cannot get any meaningful history.    Review of systems complete and found to be negative unless listed in HPI.    Current Outpatient Medications  Medication Sig Dispense Refill   acetaminophen -codeine  (TYLENOL  #3) 300-30 MG tablet Take 1 tablet by mouth every 6 (six) hours only as needed for dental pain. 10 tablet 0   albuterol  (VENTOLIN  HFA) 108 (90 Base) MCG/ACT inhaler Inhale 1-2 puffs into the lungs every 6 (six) hours as needed for wheezing or shortness of breath. 6.7 g 0   amoxicillin  (AMOXIL ) 500 MG capsule Take 1 capsule (500 mg total) by mouth every 8 (eight) hours until finished. 21 capsule 0   chlorhexidine  (PERIDEX ) 0.12 % solution Swish with 1 capful for one minute and spit out twice daily for 10 days. 473 mL 0   Ferrous Sulfate  90 (18 Fe) MG TABS Take 1 tablet by mouth daily. 30 tablet 0   furosemide  (LASIX ) 40 MG tablet Take 1&1/2 tablets (60 mg total) by mouth daily. 45 tablet 3   hydrocortisone  (ANUSOL -HC) 2.5 % rectal cream Place 1 Application rectally 2 (two) times daily. 30 g 0   metoprolol  succinate (TOPROL  XL) 25 MG 24 hr tablet Take 1 tablet (25 mg total) by mouth daily.  30 tablet 6   rivaroxaban  (XARELTO ) 20 MG TABS tablet Take 1 tablet (20 mg total) by mouth daily. 30 tablet 11   sacubitril -valsartan  (ENTRESTO ) 49-51 MG Take 1 tablet by mouth 2 (two) times daily. 60 tablet 3   spironolactone  (ALDACTONE ) 25 MG tablet Take 1 tablet (25 mg total) by mouth daily. 90 tablet 1   traZODone  (DESYREL ) 100 MG tablet Take 1 tablet (100 mg total) by mouth at bedtime. 90 tablet 2   No current facility-administered medications for this encounter.   No Known Allergies  Social History    Socioeconomic History   Marital status: Married    Spouse name: Not on file   Number of children: Not on file   Years of education: Not on file   Highest education level: Not on file  Occupational History   Not on file  Tobacco Use   Smoking status: Some Days    Types: Cigarettes   Smokeless tobacco: Never  Substance and Sexual Activity   Alcohol use: No   Drug use: No   Sexual activity: Not on file  Other Topics Concern   Not on file  Social History Narrative   Left handed   Social Drivers of Health   Tobacco Use: High Risk (08/07/2024)   Patient History    Smoking Tobacco Use: Some Days    Smokeless Tobacco Use: Never    Passive Exposure: Not on file  Financial Resource Strain: High Risk (10/03/2021)   Overall Financial Resource Strain (CARDIA)    Difficulty of Paying Living Expenses: Hard  Food Insecurity: No Food Insecurity (08/20/2023)   Hunger Vital Sign    Worried About Running Out of Food in the Last Year: Never true    Ran Out of Food in the Last Year: Never true  Transportation Needs: No Transportation Needs (08/20/2023)   PRAPARE - Administrator, Civil Service (Medical): No    Lack of Transportation (Non-Medical): No  Physical Activity: Not on file  Stress: Not on file  Social Connections: Not on file  Intimate Partner Violence: Not At Risk (08/20/2023)   Humiliation, Afraid, Rape, and Kick questionnaire    Fear of Current or Ex-Partner: No    Emotionally Abused: No    Physically Abused: No    Sexually Abused: No  Depression (PHQ2-9): Low Risk (05/15/2024)   Depression (PHQ2-9)    PHQ-2 Score: 0  Alcohol Screen: Not on file  Housing: Unknown (08/20/2023)   Housing Stability Vital Sign    Unable to Pay for Housing in the Last Year: No    Number of Times Moved in the Last Year: Not on file    Homeless in the Last Year: No  Utilities: Not on file  Health Literacy: Not on file    There were no vitals taken for this visit.  Wt Readings from  Last 3 Encounters:  07/15/24 70.3 kg (155 lb)  05/15/24 72.6 kg (160 lb)  03/26/24 72.8 kg (160 lb 9.6 oz)   PHYSICAL EXAM: General:  NAD. No resp difficulty, walked into clinic, thin HEENT: Normal Neck: Supple. No JVD. Cor: Regular rate & rhythm. No rubs, gallops or murmurs. Lungs: Clear Abdomen: Soft, nontender, nondistended.  Extremities: No cyanosis, clubbing, rash, edema Neuro: Alert & oriented x 3, moves all 4 extremities w/o difficulty. Affect pleasant.  ReDs reading: 30%, normal  ASSESSMENT & PLAN: 1. 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 Admit for a/c HF 08/22.  -  Echo (8/22): EF 15-20%, mildly dilated RV with normal RV function, mild MR, moderate TR, PAP 40-45 mmHg - Echo 08/07/21 EF < 20%.  - Refused R/LHC, in past - cMRI 2/23 EF 12% RVEF 18% minimal LGE.  - Echo 04/27/22: EF 15% RV normal. G1DD - Echo 8/25: EF 20-25% - NYHA III, volume OK. ReDs 30% - Continue Entresto  49/51 mg bid. - Continue Lasix  60 mg daily + 20 KCL daily. - Continue Toprol  XL 25 mg daily, may need to pull back or stop with fatigue. - Continue spiro 25 mg daily. - Off Farxiga  with yeast. - Labs today - Dr. Cherrie previously had long discussion with him and his mother about his situation. Suspect he will need advanced therapies in the near future but that he is not a candidate with ongoing tobacco and ETOH use. Stressed need for cessation and to remain complaint with meds. Suspect with previous CVAs, may not be transplant candidate but may be possible VAD candidate. Reiterated this today. - We discussed CPX for risk stratification. I discussed the test and requirements and asked mom if she thought Della would be able to complete, she thinks he would. Will arrange CPX.  2. 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 - Continue Xarelto , no longer on ASA - Followed by Neuro   4. Substance Use - Cocaine/ETOH and tobacco use history  - UDS + cocaine and THC last  admit 2/23 - Denies any further cocaine use, still smoking and occasional ETOH  - As above long talk about need for cessation to permit advanced therapies  5. PE/ RLE DVT - Diagnosed 08/06/21, placed on AC. - Continue Xarelto  20 mg daily. - Labs today   Toribio Cherrie, MD 08/07/2024 "

## 2024-08-10 ENCOUNTER — Ambulatory Visit (HOSPITAL_COMMUNITY)
Admission: EM | Admit: 2024-08-10 | Discharge: 2024-08-10 | Disposition: A | Discharge status: Home / Self Care | Attending: Student | Admitting: Student

## 2024-08-10 ENCOUNTER — Encounter (HOSPITAL_COMMUNITY): Payer: Self-pay

## 2024-08-10 DIAGNOSIS — Z113 Encounter for screening for infections with a predominantly sexual mode of transmission: Secondary | ICD-10-CM | POA: Diagnosis present

## 2024-08-10 NOTE — Discharge Instructions (Addendum)
-  Purchase some listerine mouthwash over-the-counter, and use 1-2x daily. Brush and floss teeth daily. Follow-up with your dentist every 6 months for cleanings. Follow-up sooner if pain or swelling in the mouth. - We are testing for gonorrhea, chlamydia, trichomonas -We will call with any positive results in about 3 business days and can send treatment if necessary. -We will not call with negative lab results. -Lab results will automatically go to your MyChart. -If you develop new or worsening penile or dental symptoms, head to the ER.

## 2024-08-10 NOTE — ED Triage Notes (Signed)
 Patient presents to the office for penile discomfort, and dental issue. Patient refuse to have his blood pressure taken at this time.

## 2024-08-10 NOTE — ED Provider Notes (Signed)
 " [facility]    CSN: [id number] Arrival date & time: 08/10/24  1728      History   Chief Complaint Chief Complaint  Patient presents with   Dental Pain   Groin Swelling    HPI Carlos Mckinney is a 29 y.o. male presenting with penile and dental issues.  -Dental issue: He is not actually having dental pain, but is concerned about several missing lower molars.  Denies foul taste in mouth, dental pain, fever/chills.  He has a education officer, community, who he has not seen recently. -Penile discomfort: unsure duration. Unsure if new partners.  Describes as discomfort over the head of the penis.  Denies urinary symptoms.    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/2021   CVA (cerebral vascular accident) (HCC) 09/20/2021   Acute on chronic diastolic congestive heart failure, NYHA class 3 (HCC) 09/01/2021   Iron deficiency anemia 08/07/2021   Multiple pulmonary emboli (HCC) 08/06/2021   Acute on chronic HFrEF (heart failure with reduced ejection fraction) (HCC) 08/06/2021   Acute deep vein thrombosis (DVT) of right lower extremity (HCC) 08/06/2021   Anemia 08/06/2021    Past Surgical History:  Procedure Laterality Date   IR CT HEAD LTD  09/22/2021   IR PERCUTANEOUS ART THROMBECTOMY/INFUSION INTRACRANIAL INC DIAG ANGIO  09/20/2021   RADIOLOGY WITH ANESTHESIA N/A 09/20/2021   Procedure: IR WITH ANESTHESIA;  Surgeon: Radiologist, Medication, MD;  Location: [facility] OR;  Service: Radiology;  Laterality: N/A;       Home Medications    Prior to Admission medications  Medication Sig Start Date End Date Taking? Authorizing Provider  rivaroxaban  (XARELTO ) 20 MG TABS tablet Take 1 tablet (20 mg total) by mouth daily. 12/13/23   Yes Bensimhon, Toribio SAUNDERS, MD  spironolactone  (ALDACTONE ) 25 MG tablet Take 1 tablet (25 mg total) by mouth daily. 05/01/24  Yes Bensimhon, Toribio SAUNDERS, MD  acetaminophen -codeine  (TYLENOL  #3) 300-30 MG tablet Take 1 tablet by mouth every 6 (six) hours only as needed for dental pain. 05/26/24     albuterol  (VENTOLIN  HFA) 108 (90 Base) MCG/ACT inhaler Inhale 1-2 puffs into the lungs every 6 (six) hours as needed for wheezing or shortness of breath. 05/15/24   Oley Bascom RAMAN, NP  Ferrous Sulfate  90 (18 Fe) MG TABS Take 1 tablet by mouth daily. 02/05/23   Oley Bascom RAMAN, NP  furosemide  (LASIX ) 40 MG tablet Take 1&1/2 tablets (60 mg total) by mouth daily. 07/14/24   Bensimhon, Toribio SAUNDERS, MD  metoprolol  succinate (TOPROL  XL) 25 MG 24 hr tablet Take 1 tablet (25 mg total) by mouth daily. 12/25/23   Bensimhon, Toribio SAUNDERS, MD  sacubitril -valsartan  (ENTRESTO ) 49-51 MG Take 1 tablet by mouth 2 (two) times daily. 05/15/24   Bensimhon, Toribio SAUNDERS, MD  traZODone  (DESYREL ) 100 MG tablet Take 1 tablet (100 mg total) by mouth at bedtime. 08/20/23   Oley Bascom RAMAN, NP    Family History Family History  Problem Relation Age of Onset   Hypertension Mother     Social History Social History[1]   Allergies   Patient has no known allergies.   Review of Systems Review of Systems  HENT:  Positive for dental problem.  Genitourinary:  Positive for penile pain.     Physical Exam Triage Vital Signs ED Triage Vitals  Encounter Vitals Group     BP --      Girls Systolic BP Percentile --      Girls Diastolic BP Percentile --      Boys Systolic BP Percentile --      Boys Diastolic BP Percentile --      Pulse Rate 08/10/24 1857 61     Resp 08/10/24 1857 16     Temp 08/10/24 1857 99.8 F (37.7 C)     Temp Source 08/10/24 1857 Oral     SpO2 --      Weight --      Height --      Head Circumference --      Peak Flow --      Pain Score 08/10/24 1900 0     Pain Loc --      Pain Education --      Exclude from  Growth Chart --    No data found.  Updated Vital Signs Pulse 61   Temp 99.8 F (37.7 C) (Oral)   Resp 16   Visual Acuity Right Eye Distance:   Left Eye Distance:   Bilateral Distance:    Right Eye Near:   Left Eye Near:    Bilateral Near:     Physical Exam Vitals reviewed.  Constitutional:      General: He is not in acute distress.    Appearance: Normal appearance. He is not ill-appearing or diaphoretic.  HENT:     Head: Normocephalic and atraumatic.     Mouth/Throat:     Comments: Multiple missing teeth.  No gingival swelling.  No swelling under the tongue or the jaw. Cardiovascular:     Rate and Rhythm: Normal rate and regular rhythm.     Heart sounds: Normal heart sounds.  Pulmonary:     Effort: Pulmonary effort is normal.     Breath sounds: Normal breath sounds.  Abdominal:     Tenderness: There is no abdominal tenderness.  Skin:    General: Skin is warm.  Neurological:     General: No focal deficit present.     Mental Status: He is alert and oriented to person, place, and time.  Psychiatric:        Mood and Affect: Mood normal.        Behavior: Behavior normal.        Thought Content: Thought content normal.        Judgment: Judgment normal.      UC Treatments / Results  Labs (all labs ordered are listed, but only abnormal results are displayed) Labs Reviewed  CYTOLOGY, (ORAL, ANAL, URETHRAL) ANCILLARY ONLY    EKG   Radiology No results found.  Procedures Procedures (including critical care time)  Medications Ordered in UC Medications - No data to display  Initial Impression / Assessment and Plan / UC Course  I have reviewed the triage vital signs and the nursing notes.  Pertinent labs & imaging results that were available during my care of the patient were reviewed by me and considered in my medical decision making (see chart for details).     Patient is a 29 y.o. male presenting with dental issue and penile issue. The patient is  afebrile and nontachycardic.  Antipyretic has not been administered today.  He declined to have his blood pressure taken today.  Will send self-swab for G/C, trich.  The patient declined a penile exam.  Regarding the dental issue, he has multiple missing teeth, but there does not appear to be an infection.  I encouraged him to purchase Listerine mouthwash over-the-counter, brush teeth daily, and floss daily.  Follow-up with dentist every 6 months.  Final Clinical Impressions(s) / UC Diagnoses   Final diagnoses:  Routine screening for STI (sexually transmitted infection)     Discharge Instructions      -Purchase some listerine mouthwash over-the-counter, and use 1-2x daily. Brush and floss teeth daily. Follow-up with your dentist every 6 months for cleanings. Follow-up sooner if pain or swelling in the mouth. - We are testing for gonorrhea, chlamydia, trichomonas -We will call with any positive results in about 3 business days and can send treatment if necessary. -We will not call with negative lab results. -Lab results will automatically go to your MyChart. -If you develop new or worsening penile or dental symptoms, head to the ER.     ED Prescriptions   None    PDMP not reviewed this encounter.     [1]  Social History Tobacco Use   Smoking status: Some Days    Types: Cigarettes   Smokeless tobacco: Never  Substance Use Topics   Alcohol use: No   Drug use: No     Arlyss Leita BRAVO, PA-C 08/10/24 1939  "

## 2024-08-11 ENCOUNTER — Ambulatory Visit (HOSPITAL_COMMUNITY): Payer: Self-pay

## 2024-08-11 LAB — CYTOLOGY, (ORAL, ANAL, URETHRAL) ANCILLARY ONLY
Chlamydia: POSITIVE — AB
Comment: NEGATIVE
Comment: NEGATIVE
Comment: NORMAL
Neisseria Gonorrhea: NEGATIVE
Trichomonas: NEGATIVE

## 2024-08-11 MED ORDER — DOXYCYCLINE HYCLATE 100 MG PO TABS: 100.0000 mg | ORAL_TABLET | Freq: Two times a day (BID) | ORAL | 0 refills | Status: AC

## 2024-08-28 ENCOUNTER — Ambulatory Visit (INDEPENDENT_AMBULATORY_CARE_PROVIDER_SITE_OTHER): Payer: Self-pay | Admitting: Nurse Practitioner

## 2024-08-28 ENCOUNTER — Encounter: Payer: Self-pay | Admitting: Nurse Practitioner

## 2024-08-28 VITALS — BP 132/60 | HR 74 | Temp 98.1°F | Wt 165.0 lb

## 2024-08-28 DIAGNOSIS — Z13220 Encounter for screening for lipoid disorders: Secondary | ICD-10-CM | POA: Diagnosis not present

## 2024-08-28 DIAGNOSIS — I63413 Cerebral infarction due to embolism of bilateral middle cerebral arteries: Secondary | ICD-10-CM | POA: Diagnosis not present

## 2024-08-28 DIAGNOSIS — R410 Disorientation, unspecified: Secondary | ICD-10-CM

## 2024-08-28 MED ORDER — ALBUTEROL SULFATE HFA 108 (90 BASE) MCG/ACT IN AERS: 1.0000 | INHALATION_SPRAY | Freq: Four times a day (QID) | RESPIRATORY_TRACT | 0 refills | Status: AC | PRN

## 2024-08-28 NOTE — Progress Notes (Signed)
 "  Subjective   Patient ID: Carlos Mckinney, male    DOB: Jan 02, 1996, 29 y.o.   MRN: [id number]  Chief Complaint  Patient presents with   Follow-up    Discuss what kind of medications suppose to be taking. Heart doctor would like to have pt referred for therapy.     Referring provider: Oley Bascom RAMAN, NP  Carlos Mckinney is a 29 y.o. male with Past Medical History: No date: Heart failure (HCC) No date: Hypertension No date: Polysubstance abuse [facility])  HPI  Patient presents for follow-up visit today.  He did fall about a month ago and was evaluated in the ED.  Patient does have a history of a stroke.  His mother is with him today and states that he has been talking to himself more and is not as understandable with his speech as usual.  We will place a referral to psychiatry for further evaluation.  Patient has followed with cardiology.  He has also been followed by neurology.  Denies f/c/s, n/v/d, hemoptysis, PND, leg swelling Denies chest pain or edema       Allergies[1]  Immunization History  Administered Date(s) Administered   DTaP 12/04/1994, 02/12/1995, 04/26/1995   HIB (PRP-OMP) 12/04/1994, 02/12/1995, 04/26/1995   HPV Quadrivalent 06/29/2010   Hepatitis B 1995/03[birth year]02, 12/04/1994, 04/26/1995   IPV 12/04/1994, 02/12/1995, 04/26/1995   Influenza-Unspecified 06/29/2010   Janssen (J&J) SARS-COV-2 Vaccination 10/29/2019   MMR 09/27/1995    Tobacco History: Tobacco Use History[2] Ready to quit: Not Answered Counseling given: Not Answered   Outpatient Encounter Medications as of 08/28/2024  Medication Sig   Ferrous Sulfate  90 (18 Fe) MG TABS Take 1 tablet by mouth daily.   furosemide  (LASIX ) 40 MG tablet Take 1&1/2 tablets (60 mg total) by mouth daily.   metoprolol  succinate (TOPROL  XL) 25 MG 24 hr tablet Take 1 tablet (25 mg total) by mouth daily.   rivaroxaban  (XARELTO ) 20 MG TABS tablet Take 1 tablet (20 mg total) by mouth daily.   sacubitril -valsartan  (ENTRESTO )  49-51 MG Take 1 tablet by mouth 2 (two) times daily.   spironolactone  (ALDACTONE ) 25 MG tablet Take 1 tablet (25 mg total) by mouth daily.   traZODone  (DESYREL ) 100 MG tablet Take 1 tablet (100 mg total) by mouth at bedtime.   acetaminophen -codeine  (TYLENOL  #3) 300-30 MG tablet Take 1 tablet by mouth every 6 (six) hours only as needed for dental pain. (Patient not taking: Reported on 08/28/2024)   albuterol  (VENTOLIN  HFA) 108 (90 Base) MCG/ACT inhaler Inhale 1-2 puffs into the lungs every 6 (six) hours as needed for wheezing or shortness of breath.   [DISCONTINUED] albuterol  (VENTOLIN  HFA) 108 (90 Base) MCG/ACT inhaler Inhale 1-2 puffs into the lungs every 6 (six) hours as needed for wheezing or shortness of breath. (Patient not taking: Reported on 08/28/2024)   No facility-administered encounter medications on file as of 08/28/2024.    Review of Systems  Review of Systems  Constitutional: Negative.   HENT: Negative.    Cardiovascular: Negative.   Gastrointestinal: Negative.   Allergic/Immunologic: Negative.   Neurological: Negative.   Psychiatric/Behavioral: Negative.       Objective:   BP 132/60   Pulse 74   Temp 98.1 F (36.7 C) (Temporal)   Wt 165 lb (74.8 kg)   SpO2 99%   BMI 23.01 kg/m   Wt Readings from Last 5 Encounters:  08/28/24 165 lb (74.8 kg)  08/07/24 160 lb 9.6 oz (72.8 kg)  07/15/24 155 lb (70.3 kg)  05/15/24 160 lb (72.6 kg)  03/26/24 160 lb 9.6 oz (72.8 kg)     Physical Exam Vitals and nursing note reviewed.  Constitutional:      General: He is not in acute distress.    Appearance: He is well-developed.  Cardiovascular:     Rate and Rhythm: Normal rate and regular rhythm.  Pulmonary:     Effort: Pulmonary effort is normal.     Breath sounds: Normal breath sounds.  Skin:    General: Skin is warm and dry.  Neurological:     Mental Status: He is alert and oriented to person, place, and time.       Assessment & Plan:   Cerebrovascular accident  (CVA) due to bilateral embolism of middle cerebral arteries (HCC) -     CBC -     Comprehensive metabolic panel with GFR -     Ambulatory referral to Psychiatry  Lipid screening -     Lipid panel  Mental confusion -     Ambulatory referral to Psychiatry  Other orders -     Albuterol  Sulfate HFA; Inhale 1-2 puffs into the lungs every 6 (six) hours as needed for wheezing or shortness of breath.  Dispense: 6.7 g; Refill: 0     Return in about 6 months (around 02/25/2025).    Bascom GORMAN Borer, NP 08/28/2024     [1] No Known Allergies [2]  Social History Tobacco Use  Smoking Status Some Days   Types: Cigarettes  Smokeless Tobacco Never   "

## 2024-08-29 ENCOUNTER — Ambulatory Visit: Payer: Self-pay | Admitting: Nurse Practitioner

## 2024-08-29 LAB — COMPREHENSIVE METABOLIC PANEL WITH GFR
ALT: 11 [IU]/L (ref 0–44)
AST: 21 [IU]/L (ref 0–40)
Albumin: 4.6 g/dL (ref 4.3–5.2)
Alkaline Phosphatase: 71 [IU]/L (ref 47–123)
BUN/Creatinine Ratio: 12 (ref 9–20)
BUN: 15 mg/dL (ref 6–20)
Bilirubin Total: 0.4 mg/dL (ref 0.0–1.2)
CO2: 22 mmol/L (ref 20–29)
Calcium: 9.3 mg/dL (ref 8.7–10.2)
Chloride: 100 mmol/L (ref 96–106)
Creatinine, Ser: 1.3 mg/dL — ABNORMAL HIGH (ref 0.76–1.27)
Globulin, Total: 2.6 g/dL (ref 1.5–4.5)
Glucose: 87 mg/dL (ref 70–99)
Potassium: 4.2 mmol/L (ref 3.5–5.2)
Sodium: 136 mmol/L (ref 134–144)
Total Protein: 7.2 g/dL (ref 6.0–8.5)
eGFR: 76 mL/min/{1.73_m2}

## 2024-08-29 LAB — CBC
Hematocrit: 41 % (ref 37.5–51.0)
Hemoglobin: 13.4 g/dL (ref 13.0–17.7)
MCH: 32.8 pg (ref 26.6–33.0)
MCHC: 32.7 g/dL (ref 31.5–35.7)
MCV: 100 fL — ABNORMAL HIGH (ref 79–97)
Platelets: 202 10*3/uL (ref 150–450)
RBC: 4.09 x10E6/uL — ABNORMAL LOW (ref 4.14–5.80)
RDW: 12.1 % (ref 11.6–15.4)
WBC: 5 10*3/uL (ref 3.4–10.8)

## 2024-08-29 LAB — LIPID PANEL
Chol/HDL Ratio: 3 ratio (ref 0.0–5.0)
Cholesterol, Total: 164 mg/dL (ref 100–199)
HDL: 55 mg/dL
LDL Chol Calc (NIH): 79 mg/dL (ref 0–99)
Triglycerides: 179 mg/dL — ABNORMAL HIGH (ref 0–149)
VLDL Cholesterol Cal: 30 mg/dL (ref 5–40)

## 2024-11-27 ENCOUNTER — Ambulatory Visit (HOSPITAL_COMMUNITY)

## 2025-02-25 ENCOUNTER — Ambulatory Visit: Payer: Self-pay | Admitting: Nurse Practitioner
